# Patient Record
Sex: Female | Born: 1980
Health system: Southern US, Community
[De-identification: ages and names within clinical notes are randomized; demographics above are authoritative.]

## PROBLEM LIST (undated history)

## (undated) DIAGNOSIS — F329 Major depressive disorder, single episode, unspecified: Secondary | ICD-10-CM

## (undated) DIAGNOSIS — L732 Hidradenitis suppurativa: Secondary | ICD-10-CM

## (undated) DIAGNOSIS — K279 Peptic ulcer, site unspecified, unspecified as acute or chronic, without hemorrhage or perforation: Secondary | ICD-10-CM

## (undated) DIAGNOSIS — F419 Anxiety disorder, unspecified: Secondary | ICD-10-CM

## (undated) DIAGNOSIS — F32A Depression, unspecified: Secondary | ICD-10-CM

## (undated) DIAGNOSIS — A630 Anogenital (venereal) warts: Secondary | ICD-10-CM

## (undated) DIAGNOSIS — L0591 Pilonidal cyst without abscess: Secondary | ICD-10-CM

## (undated) HISTORY — DX: Peptic ulcer, site unspecified, unspecified as acute or chronic, without hemorrhage or perforation: K27.9

## (undated) HISTORY — DX: Major depressive disorder, single episode, unspecified: F32.9

## (undated) HISTORY — DX: Pilonidal cyst without abscess: L05.91

## (undated) HISTORY — DX: Depression, unspecified: F32.A

## (undated) HISTORY — DX: Anogenital (venereal) warts: A63.0

## (undated) HISTORY — DX: Hidradenitis suppurativa: L73.2

---
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
## 2002-01-25 ENCOUNTER — Emergency Department (HOSPITAL_COMMUNITY): Admission: EM | Admit: 2002-01-25 | Discharge: 2002-01-25 | Payer: Self-pay | Admitting: Emergency Medicine

## 2002-02-25 ENCOUNTER — Emergency Department (HOSPITAL_COMMUNITY): Admission: EM | Admit: 2002-02-25 | Discharge: 2002-02-25 | Payer: Self-pay | Admitting: *Deleted

## 2002-03-29 ENCOUNTER — Emergency Department (HOSPITAL_COMMUNITY): Admission: EM | Admit: 2002-03-29 | Discharge: 2002-03-29 | Payer: Self-pay | Admitting: *Deleted

## 2002-04-08 ENCOUNTER — Emergency Department (HOSPITAL_COMMUNITY): Admission: EM | Admit: 2002-04-08 | Discharge: 2002-04-08 | Payer: Self-pay | Admitting: Emergency Medicine

## 2002-05-08 ENCOUNTER — Inpatient Hospital Stay (HOSPITAL_COMMUNITY): Admission: AD | Admit: 2002-05-08 | Discharge: 2002-05-08 | Payer: Self-pay | Admitting: Family Medicine

## 2002-05-08 ENCOUNTER — Encounter: Payer: Self-pay | Admitting: Family Medicine

## 2002-06-01 ENCOUNTER — Inpatient Hospital Stay (HOSPITAL_COMMUNITY): Admission: AD | Admit: 2002-06-01 | Discharge: 2002-06-01 | Payer: Self-pay | Admitting: Obstetrics and Gynecology

## 2002-06-27 HISTORY — PX: TUBAL LIGATION: SHX77

## 2002-08-02 ENCOUNTER — Ambulatory Visit (HOSPITAL_COMMUNITY): Admission: RE | Admit: 2002-08-02 | Discharge: 2002-08-02 | Payer: Self-pay

## 2002-08-10 ENCOUNTER — Encounter: Payer: Self-pay | Admitting: *Deleted

## 2002-08-11 ENCOUNTER — Inpatient Hospital Stay (HOSPITAL_COMMUNITY): Admission: AD | Admit: 2002-08-11 | Discharge: 2002-08-13 | Payer: Self-pay | Admitting: *Deleted

## 2002-08-13 ENCOUNTER — Encounter (INDEPENDENT_AMBULATORY_CARE_PROVIDER_SITE_OTHER): Payer: Self-pay

## 2002-10-15 ENCOUNTER — Inpatient Hospital Stay (HOSPITAL_COMMUNITY): Admission: AD | Admit: 2002-10-15 | Discharge: 2002-10-15 | Payer: Self-pay | Admitting: Obstetrics and Gynecology

## 2002-12-26 ENCOUNTER — Inpatient Hospital Stay (HOSPITAL_COMMUNITY): Admission: AD | Admit: 2002-12-26 | Discharge: 2002-12-26 | Payer: Self-pay | Admitting: Obstetrics & Gynecology

## 2003-02-02 ENCOUNTER — Inpatient Hospital Stay (HOSPITAL_COMMUNITY): Admission: AD | Admit: 2003-02-02 | Discharge: 2003-02-02 | Payer: Self-pay | Admitting: Family Medicine

## 2003-02-19 ENCOUNTER — Encounter: Payer: Self-pay | Admitting: Obstetrics & Gynecology

## 2003-02-20 ENCOUNTER — Inpatient Hospital Stay (HOSPITAL_COMMUNITY): Admission: AD | Admit: 2003-02-20 | Discharge: 2003-02-21 | Payer: Self-pay | Admitting: Obstetrics & Gynecology

## 2003-02-20 ENCOUNTER — Encounter: Payer: Self-pay | Admitting: Obstetrics & Gynecology

## 2003-02-20 ENCOUNTER — Encounter (INDEPENDENT_AMBULATORY_CARE_PROVIDER_SITE_OTHER): Payer: Self-pay

## 2003-02-22 ENCOUNTER — Inpatient Hospital Stay (HOSPITAL_COMMUNITY): Admission: AD | Admit: 2003-02-22 | Discharge: 2003-02-26 | Payer: Self-pay | Admitting: Obstetrics and Gynecology

## 2003-02-22 ENCOUNTER — Encounter: Payer: Self-pay | Admitting: Obstetrics and Gynecology

## 2003-02-24 ENCOUNTER — Encounter: Payer: Self-pay | Admitting: Obstetrics and Gynecology

## 2003-03-07 ENCOUNTER — Inpatient Hospital Stay (HOSPITAL_COMMUNITY): Admission: AD | Admit: 2003-03-07 | Discharge: 2003-03-07 | Payer: Self-pay | Admitting: Obstetrics and Gynecology

## 2003-03-23 ENCOUNTER — Emergency Department (HOSPITAL_COMMUNITY): Admission: EM | Admit: 2003-03-23 | Discharge: 2003-03-23 | Payer: Self-pay | Admitting: Emergency Medicine

## 2003-04-20 ENCOUNTER — Inpatient Hospital Stay (HOSPITAL_COMMUNITY): Admission: AD | Admit: 2003-04-20 | Discharge: 2003-04-20 | Payer: Self-pay | Admitting: Obstetrics and Gynecology

## 2003-07-26 ENCOUNTER — Inpatient Hospital Stay (HOSPITAL_COMMUNITY): Admission: AD | Admit: 2003-07-26 | Discharge: 2003-07-27 | Payer: Self-pay | Admitting: *Deleted

## 2004-02-28 ENCOUNTER — Ambulatory Visit (HOSPITAL_COMMUNITY): Admission: AD | Admit: 2004-02-28 | Discharge: 2004-02-28 | Payer: Self-pay | Admitting: *Deleted

## 2004-02-28 ENCOUNTER — Encounter (INDEPENDENT_AMBULATORY_CARE_PROVIDER_SITE_OTHER): Payer: Self-pay | Admitting: Specialist

## 2004-06-16 ENCOUNTER — Inpatient Hospital Stay (HOSPITAL_COMMUNITY): Admission: AD | Admit: 2004-06-16 | Discharge: 2004-06-17 | Payer: Self-pay | Admitting: *Deleted

## 2004-10-19 ENCOUNTER — Other Ambulatory Visit: Admission: RE | Admit: 2004-10-19 | Discharge: 2004-10-19 | Payer: Self-pay | Admitting: Obstetrics and Gynecology

## 2005-03-31 ENCOUNTER — Emergency Department (HOSPITAL_COMMUNITY): Admission: EM | Admit: 2005-03-31 | Discharge: 2005-03-31 | Payer: Self-pay | Admitting: Family Medicine

## 2005-04-19 ENCOUNTER — Emergency Department (HOSPITAL_COMMUNITY): Admission: EM | Admit: 2005-04-19 | Discharge: 2005-04-19 | Payer: Self-pay | Admitting: Family Medicine

## 2005-06-15 ENCOUNTER — Emergency Department (HOSPITAL_COMMUNITY): Admission: EM | Admit: 2005-06-15 | Discharge: 2005-06-15 | Payer: Self-pay | Admitting: Emergency Medicine

## 2005-12-30 ENCOUNTER — Emergency Department (HOSPITAL_COMMUNITY): Admission: EM | Admit: 2005-12-30 | Discharge: 2005-12-30 | Payer: Self-pay | Admitting: Family Medicine

## 2006-01-01 ENCOUNTER — Emergency Department (HOSPITAL_COMMUNITY): Admission: EM | Admit: 2006-01-01 | Discharge: 2006-01-01 | Payer: Self-pay | Admitting: Emergency Medicine

## 2006-02-24 ENCOUNTER — Emergency Department (HOSPITAL_COMMUNITY): Admission: EM | Admit: 2006-02-24 | Discharge: 2006-02-24 | Payer: Self-pay | Admitting: Family Medicine

## 2006-02-24 ENCOUNTER — Emergency Department (HOSPITAL_COMMUNITY): Admission: EM | Admit: 2006-02-24 | Discharge: 2006-02-24 | Payer: Self-pay | Admitting: *Deleted

## 2006-07-05 ENCOUNTER — Other Ambulatory Visit: Admission: RE | Admit: 2006-07-05 | Discharge: 2006-07-05 | Payer: Self-pay | Admitting: *Deleted

## 2006-10-07 ENCOUNTER — Emergency Department (HOSPITAL_COMMUNITY): Admission: EM | Admit: 2006-10-07 | Discharge: 2006-10-07 | Payer: Self-pay | Admitting: Emergency Medicine

## 2007-04-19 ENCOUNTER — Emergency Department (HOSPITAL_COMMUNITY): Admission: EM | Admit: 2007-04-19 | Discharge: 2007-04-19 | Payer: Self-pay | Admitting: Emergency Medicine

## 2007-06-28 LAB — CONVERTED CEMR LAB: Pap Smear: ABNORMAL

## 2010-02-22 ENCOUNTER — Ambulatory Visit: Payer: Self-pay | Admitting: Internal Medicine

## 2010-02-22 DIAGNOSIS — K279 Peptic ulcer, site unspecified, unspecified as acute or chronic, without hemorrhage or perforation: Secondary | ICD-10-CM | POA: Insufficient documentation

## 2010-02-22 DIAGNOSIS — E669 Obesity, unspecified: Secondary | ICD-10-CM | POA: Insufficient documentation

## 2010-02-22 DIAGNOSIS — F329 Major depressive disorder, single episode, unspecified: Secondary | ICD-10-CM | POA: Insufficient documentation

## 2010-02-22 DIAGNOSIS — L732 Hidradenitis suppurativa: Secondary | ICD-10-CM | POA: Insufficient documentation

## 2010-02-22 DIAGNOSIS — F17200 Nicotine dependence, unspecified, uncomplicated: Secondary | ICD-10-CM | POA: Insufficient documentation

## 2010-02-22 LAB — CONVERTED CEMR LAB
ALT: 15 units/L (ref 0–35)
AST: 19 units/L (ref 0–37)
Albumin: 4.1 g/dL (ref 3.5–5.2)
Alkaline Phosphatase: 56 units/L (ref 39–117)
BUN: 8 mg/dL (ref 6–23)
Basophils Absolute: 0.1 10*3/uL (ref 0.0–0.1)
Basophils Relative: 0.5 % (ref 0.0–3.0)
Bilirubin, Direct: 0.1 mg/dL (ref 0.0–0.3)
CO2: 29 meq/L (ref 19–32)
Calcium: 9.3 mg/dL (ref 8.4–10.5)
Chloride: 104 meq/L (ref 96–112)
Creatinine, Ser: 0.7 mg/dL (ref 0.4–1.2)
Eosinophils Absolute: 0.4 10*3/uL (ref 0.0–0.7)
Eosinophils Relative: 3.4 % (ref 0.0–5.0)
GFR calc non Af Amer: 126.87 mL/min (ref >60)
Glucose, Bld: 96 mg/dL (ref 70–99)
HCT: 38.4 % (ref 36.0–46.0)
Hemoglobin: 12.9 g/dL (ref 12.0–15.0)
Hgb A1c MFr Bld: 5.6 % (ref 4.6–6.5)
Lymphocytes Relative: 32.2 % (ref 12.0–46.0)
Lymphs Abs: 3.9 10*3/uL (ref 0.7–4.0)
MCHC: 33.6 g/dL (ref 30.0–36.0)
MCV: 87.3 fL (ref 78.0–100.0)
Monocytes Absolute: 0.7 10*3/uL (ref 0.1–1.0)
Monocytes Relative: 5.9 % (ref 3.0–12.0)
Neutro Abs: 7.1 10*3/uL (ref 1.4–7.7)
Neutrophils Relative %: 58 % (ref 43.0–77.0)
Platelets: 328 10*3/uL (ref 150.0–400.0)
Potassium: 4.1 meq/L (ref 3.5–5.1)
RBC: 4.41 M/uL (ref 3.87–5.11)
RDW: 14.3 % (ref 11.5–14.6)
Sodium: 142 meq/L (ref 135–145)
TSH: 1.13 microintl units/mL (ref 0.35–5.50)
Total Bilirubin: 0.4 mg/dL (ref 0.3–1.2)
Total Protein: 6.8 g/dL (ref 6.0–8.3)
WBC: 12.2 10*3/uL — ABNORMAL HIGH (ref 4.5–10.5)
hCG, Beta Chain, Quant, S: 0.16 milliintl units/mL

## 2010-02-24 ENCOUNTER — Encounter: Payer: Self-pay | Admitting: Internal Medicine

## 2010-03-30 ENCOUNTER — Telehealth: Payer: Self-pay | Admitting: Internal Medicine

## 2010-04-09 ENCOUNTER — Ambulatory Visit: Payer: Self-pay | Admitting: Internal Medicine

## 2010-04-23 ENCOUNTER — Ambulatory Visit: Payer: Self-pay | Admitting: Internal Medicine

## 2010-07-07 ENCOUNTER — Ambulatory Visit
Admission: RE | Admit: 2010-07-07 | Discharge: 2010-07-07 | Payer: Self-pay | Source: Home / Self Care | Attending: Internal Medicine | Admitting: Internal Medicine

## 2010-07-15 ENCOUNTER — Telehealth: Payer: Self-pay | Admitting: Internal Medicine

## 2010-07-27 NOTE — Assessment & Plan Note (Signed)
Summary: NEW    Vital Signs:  Patient profile:   30 year old female Menstrual status:  regular LMP:     02/15/2010 Height:      68 inches Weight:      242 pounds BMI:     36.93 O2 Sat:      97 % on Room air Temp:     97.6 degrees F oral Pulse rate:   94 / minute Pulse rhythm:   regular Resp:     16 per minute BP sitting:   108 / 72  (left arm) Cuff size:   large  Vitals Entered By: Rock Nephew CMA (February 22, 2010 2:55 PM)  Nutrition Counseling: Patient's BMI is greater than 25 and therefore counseled on weight management options.  O2 Flow:  Room air  Primary Care Provider:  Etta Grandchild MD   History of Present Illness: New to me she has a large painful cyst on the back of her neck for several months.  Preventive Screening-Counseling & Management  Alcohol-Tobacco     Alcohol drinks/day: <1     Alcohol type: beer     >5/day in last 3 mos: no     Alcohol Counseling: not indicated; use of alcohol is not excessive or problematic     Feels need to cut down: no     Feels annoyed by complaints: no     Feels guilty re: drinking: no     Needs 'eye opener' in am: no     Smoking Status: current     Smoking Cessation Counseling: yes     Packs/Day: 1.0     Year Started: 2001     Pack years: 10     Tobacco Counseling: to quit use of tobacco products  Caffeine-Diet-Exercise     Does Patient Exercise: no     Exercise Counseling: to improve exercise regimen  Hep-HIV-STD-Contraception     Hepatitis Risk: no risk noted     HIV Risk: no risk noted     STD Risk: no risk noted     Dental Visit-last 6 months no     Sun Exposure-Excessive: no  Safety-Violence-Falls     Seat Belt Use: yes     Helmet Use: yes     Firearms in the Home: no firearms in the home     Smoke Detectors: yes     Violence in the Home: risk noted      Sexual History:  currently monogamous.        Drug Use:  never and no.        Blood Transfusions:  no.    Medications Prior to Update: 1)   None  Current Medications (verified): 1)  Doxycycline Hyclate 100 Mg Tabs (Doxycycline Hyclate) .... One By Mouth Two Times A Day For 30 Days 2)  Lortab 7.5-500 Mg Tabs (Hydrocodone-Acetaminophen) .... One By Mouth Qid As Needed For Pain 3)  Fluconazole 150 Mg Tabs (Fluconazole) .... Take One As Directed  Allergies (verified): No Known Drug Allergies  Past History:  Family History: Last updated: 02/22/2010 Family History Diabetes 1st degree relative Family History Lung cancer  Social History: Last updated: 02/22/2010 Occupation: CNA, Med Tech Single Current Smoker Alcohol use-yes Drug use-no Regular exercise-no  Risk Factors: Alcohol Use: <1 (02/22/2010) >5 drinks/d w/in last 3 months: no (02/22/2010) Exercise: no (02/22/2010)  Risk Factors: Smoking Status: current (02/22/2010) Packs/Day: 1.0 (02/22/2010)  Past Medical History: Depression Peptic ulcer disease Condyloma accmuniata  Past Surgical History: Tubal  ligation  Family History: Reviewed history and no changes required. Family History Diabetes 1st degree relative Family History Lung cancer  Social History: Reviewed history and no changes required. 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  The patient denies anorexia, fever, weight loss, chest pain, syncope, dyspnea on exertion, peripheral edema, prolonged cough, headaches, hemoptysis, abdominal pain, hematuria, difficulty walking, depression, abnormal bleeding, and  enlarged lymph nodes.    Physical Exam  General:  alert, well-developed, well-nourished, well-hydrated, normal appearance, healthy-appearing, cooperative to examination, good hygiene, and overweight-appearing.   Head:  normocephalic, atraumatic, no abnormalities observed, and no abnormalities palpated.   Ears:  R ear normal and L ear normal.   Mouth:  Oral mucosa and oropharynx without lesions or exudates.  Teeth in good repair. Neck:  supple, full ROM, no masses, no thyromegaly, no JVD, normal carotid upstroke, no carotid bruits, no cervical lymphadenopathy, and no neck tenderness.   Lungs:  normal respiratory effort, no intercostal retractions, no accessory muscle use, normal breath sounds, no dullness, no fremitus, no crackles, and no wheezes.   Heart:  normal rate, regular rhythm, no murmur, no gallop, and no rub.   Abdomen:  soft, non-tender, normal bowel sounds, no distention, no masses, no guarding, no rigidity, no rebound tenderness, no abdominal hernia, no inguinal hernia, no hepatomegaly, and no splenomegaly.   Msk:  normal ROM, no joint tenderness, no joint swelling, no joint warmth, no redness over joints, no joint deformities, no joint instability, and no crepitation.   Pulses:  R and L carotid,radial,femoral,dorsalis pedis and posterior tibial pulses are full and equal bilaterally Extremities:  No clubbing, cyanosis, edema, or deformity noted with normal full range of motion of all joints.   Neurologic:  No cranial nerve deficits noted. Station and gait are normal. Plantar reflexes are down-going bilaterally. DTRs are symmetrical throughout. Sensory, motor and coordinative functions appear intact. Skin:  tattoo(s).  she has a very large hidradenitis lesion horizontally across her posterior neck that is erythematous, has a foul smell, and oozes a scant amount of exudate but there is no dsicreet area of induration or fluctuance. In her axillae there are pits, scars and very small cystic  lesions without abscess formation Cervical Nodes:  no anterior cervical adenopathy and no posterior cervical adenopathy.   Axillary Nodes:  no R axillary adenopathy and no L axillary adenopathy.   Inguinal Nodes:  no R inguinal adenopathy and no L inguinal adenopathy.   Psych:  Cognition and judgment appear intact. Alert and cooperative with normal attention span and concentration. No apparent delusions, illusions, hallucinations   Impression & Recommendations:  Problem # 1:  ENCOUNTER FOR LONG-TERM USE OF OTHER MEDICATIONS (ICD-V58.69)  Orders: Venipuncture (00938) TLB-BMP (Basic Metabolic Panel-BMET) (80048-METABOL) TLB-CBC Platelet - w/Differential (85025-CBCD) TLB-Hepatic/Liver Function Pnl (80076-HEPATIC) TLB-TSH (Thyroid Stimulating Hormone) (84443-TSH) TLB-A1C / Hgb A1C (Glycohemoglobin) (83036-A1C) TLB-Preg Serum Quant (B-hCG) (84702-HCG-QN)  Problem # 2:  OBESITY (ICD-278.00)  Orders: Venipuncture (18299) TLB-BMP (Basic Metabolic Panel-BMET) (80048-METABOL) TLB-CBC  Platelet - w/Differential (85025-CBCD) TLB-Hepatic/Liver Function Pnl (80076-HEPATIC) TLB-TSH (Thyroid Stimulating Hormone) (84443-TSH) TLB-A1C / Hgb A1C (Glycohemoglobin) (83036-A1C) TLB-Preg Serum Quant (B-hCG) (84702-HCG-QN)  Problem # 3:  TOBACCO USE (ICD-305.1)  Orders: Venipuncture (84132) TLB-BMP (Basic Metabolic Panel-BMET) (80048-METABOL) TLB-CBC Platelet - w/Differential (85025-CBCD) TLB-Hepatic/Liver Function Pnl (80076-HEPATIC) TLB-TSH (Thyroid Stimulating Hormone) (84443-TSH) TLB-A1C / Hgb A1C (Glycohemoglobin) (83036-A1C) TLB-Preg Serum Quant (B-hCG) (84702-HCG-QN) Tobacco use cessation intermediate 3-10 minutes (44010)  Problem # 4:  HIDRADENITIS SUPPURATIVA (ICD-705.83) start doxycycline, she sees Dermatology later this week Orders: Venipuncture (27253) TLB-BMP (Basic Metabolic Panel-BMET) (80048-METABOL) TLB-CBC Platelet - w/Differential (85025-CBCD) TLB-Hepatic/Liver Function Pnl  (80076-HEPATIC) TLB-TSH (Thyroid Stimulating Hormone) (84443-TSH) TLB-A1C / Hgb A1C (Glycohemoglobin) (83036-A1C) TLB-Preg Serum Quant (B-hCG) (84702-HCG-QN) Tobacco use cessation intermediate 3-10 minutes (66440)  Complete Medication List: 1)  Doxycycline Hyclate 100 Mg Tabs (Doxycycline hyclate) .... One by mouth two times a day for 30 days 2)  Lortab 7.5-500 Mg Tabs (Hydrocodone-acetaminophen) .... One by mouth qid as needed for pain 3)  Fluconazole 150 Mg Tabs (Fluconazole) .... Take one as directed  Patient Instructions: 1)  Please schedule a follow-up appointment in 2 months. 2)  Tobacco is very bad for your health and your loved ones! You Should stop smoking!. 3)  Stop Smoking Tips: Choose a Quit date. Cut down before the Quit date. decide what you will do as a substitute when you feel the urge to smoke(gum,toothpick,exercise). 4)  It is important that you exercise regularly at least 20 minutes 5 times a week. If you develop chest pain, have severe difficulty breathing, or feel very tired , stop exercising immediately and seek medical attention. 5)  You need to lose weight. Consider a lower calorie diet and regular exercise.  6)  If you could be exposed to sexually transmitted diseases, you should use a condom. 7)  If you are having sex and you or your partner don't want a child, use contraception. 8)  Take your antibiotic as prescribed until ALL of it is gone, but stop if you develop a rash or swelling and contact our office as soon as possible. Prescriptions: FLUCONAZOLE 150 MG TABS (FLUCONAZOLE) take one as directed  #1 x 5   Entered and Authorized by:   Etta Grandchild MD   Signed by:   Etta Grandchild MD on 02/22/2010   Method used:   Electronically to        [facility] [address] [id number]* (retail)       [address] [place], [place]  [address]       Ph: [phone]       Fax: [phone]   RxID:   [id number] LORTAB 7.5-500 MG TABS (HYDROCODONE-ACETAMINOPHEN)  One by mouth QID as needed for pain  #35 x 3   Entered and Authorized by:   Etta Grandchild MD   Signed by:   Etta Grandchild MD on 02/22/2010   Method used:   Print then Give to Patient   RxID:   [id number] DOXYCYCLINE HYCLATE 100 MG TABS (DOXYCYCLINE HYCLATE) One by mouth two times a day for 30 days  #60 x 3   Entered and Authorized by:   Etta Grandchild MD   Signed by:   Etta Grandchild MD on 02/22/2010   Method used:   Print then Give to Patient   RxID:   [id number]   Preventive Care Screening  Pap Smear:    Date:  06/28/2007    Results:  abnormal

## 2010-07-27 NOTE — Letter (Signed)
Summary: Results Follow-up Letter  [facility]  [address] [place], [place] [address]   Phone: [phone]  Fax: [phone]    02/24/2010  [address] [place], [place]  [address]  Dear Ms. Tara Ward,   The following are the results of your recent test(s):  Test     Result     CBC       slightly elevated white blood cell count Liver/kidney   normal Thyroid     normal Urine       normal   _________________________________________________________  Please call for an appointment in 2-3 weeks _________________________________________________________ _________________________________________________________ _________________________________________________________  Sincerely,  Sanda Linger MD [facility]

## 2010-07-27 NOTE — Assessment & Plan Note (Signed)
Summary: PER AMI--SCHED--FU--MED--STC   Vital Signs:  Patient profile:   30 year old female Menstrual status:  regular Height:      68 inches Weight:      242 pounds O2 Sat:      98 % on Room air Temp:     98.4 degrees F oral Pulse rate:   80 / minute Pulse rhythm:   regular Resp:     16 per minute BP sitting:   108 / 68  (left arm) Cuff size:   large  Vitals Entered By: Rock Nephew CMA (April 09, 2010 11:19 AM)  O2 Flow:  Room air CC: follow-up visit/med refill Is Patient Diabetic? No Pain Assessment Patient in pain? no       Does patient need assistance? Functional Status Self care Ambulation Normal   Primary Care Provider:  Etta Grandchild MD  CC:  follow-up visit/med refill.  History of Present Illness: She returns for f/up and asks that I change Lortab to Tramadol for pain relief. She has a panful lesion on the back of her neck and says that she saw ?Dermatologist and has a combo or hidradenitis suppurative and keloid, she is trying suppressive therapy with Doxy and pain relief but she says the Derm. told her that she may need to have surgery done.  Preventive Screening-Counseling & Management  Alcohol-Tobacco     Alcohol drinks/day: <1     Alcohol type: beer     >5/day in last 3 mos: no     Alcohol Counseling: not indicated; use of alcohol is not excessive or problematic     Feels need to cut down: no     Feels annoyed by complaints: no     Feels guilty re: drinking: no     Needs 'eye opener' in am: no     Smoking Status: current     Smoking Cessation Counseling: yes     Packs/Day: 1.0     Year Started: 2001     Pack years: 10     Tobacco Counseling: to quit use of tobacco products  Hep-HIV-STD-Contraception     Hepatitis Risk: no risk noted     HIV Risk: no risk noted     STD Risk: no risk noted     Dental Visit-last 6 months no     Sun Exposure-Excessive: no  Clinical Review Panels:  Prevention   Last Pap Smear:  abnormal  (06/28/2007)  Immunizations   Last Tetanus Booster:  Historical (02/22/2010)  Diabetes Management   HgBA1C:  5.6 (02/22/2010)   Creatinine:  0.7 (02/22/2010)  CBC   WBC:  12.2 (02/22/2010)   RBC:  4.41 (02/22/2010)   Hgb:  12.9 (02/22/2010)   Hct:  38.4 (02/22/2010)   Platelets:  328.0 (02/22/2010)   MCV  87.3 (02/22/2010)   MCHC  33.6 (02/22/2010)   RDW  14.3 (02/22/2010)   PMN:  58.0 (02/22/2010)   Lymphs:  32.2 (02/22/2010)   Monos:  5.9 (02/22/2010)   Eosinophils:  3.4 (02/22/2010)   Basophil:  0.5 (02/22/2010)  Complete Metabolic Panel   Glucose:  96 (02/22/2010)   Sodium:  142 (02/22/2010)   Potassium:  4.1 (02/22/2010)   Chloride:  104 (02/22/2010)   CO2:  29 (02/22/2010)   BUN:  8 (02/22/2010)   Creatinine:  0.7 (02/22/2010)   Albumin:  4.1 (02/22/2010)   Total Protein:  6.8 (02/22/2010)   Calcium:  9.3 (02/22/2010)   Total Bili:  0.4 (02/22/2010)   Alk Phos:  56 (02/22/2010)   SGPT (ALT):  15 (02/22/2010)   SGOT (AST):  19 (02/22/2010)   Medications Prior to Update: 1)  Lortab 7.5-500 Mg Tabs (Hydrocodone-Acetaminophen) .... One By Mouth Qid As Needed For Pain 2)  Fluconazole 150 Mg Tabs (Fluconazole) .... Take One As Directed  Current Medications (verified): 1)  Fluconazole 150 Mg Tabs (Fluconazole) .... Take One As Directed 2)  Tramadol Hcl 50 Mg Tabs (Tramadol Hcl) .Marland Kitchen.. 1-2 By Mouth Qid As Needed For Pain  Allergies (verified): No Known Drug Allergies  Past History:  Past Medical History: Last updated: 02/22/2010 Depression Peptic ulcer disease Condyloma accmuniata  Past Surgical History: Last updated: 02/22/2010 Tubal ligation  Family History: Last updated: 02/22/2010 Family History Diabetes 1st degree relative Family History Lung cancer  Social History: Last updated: 02/22/2010 Occupation: CNA, Med Tech Single Current Smoker Alcohol use-yes Drug use-no Regular exercise-no  Risk Factors: Alcohol Use: <1 (04/09/2010) >5  drinks/d w/in last 3 months: no (04/09/2010) Exercise: no (02/22/2010)  Risk Factors: Smoking Status: current (04/09/2010) Packs/Day: 1.0 (04/09/2010)  Family History: Reviewed history from 02/22/2010 and no changes required. Family History Diabetes 1st degree relative Family History Lung cancer  Social History: Reviewed history from 02/22/2010 and no changes required. 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    Physical Exam  General:  alert, well-developed, well-nourished, well-hydrated, normal appearance, healthy-appearing, cooperative to examination, good hygiene, and overweight-appearing.   Head:  normocephalic, atraumatic, no abnormalities observed, and no abnormalities palpated.   Mouth:  Oral mucosa and oropharynx without lesions or exudates.  Teeth in good repair. Neck:  supple, full ROM, no masses, no thyromegaly, no JVD, normal carotid upstroke, no carotid bruits, no cervical lymphadenopathy, and no neck tenderness.   Lungs:  normal respiratory effort, no intercostal retractions, no accessory muscle use, normal breath sounds, no dullness, no fremitus, no crackles, and no wheezes.   Heart:  normal rate, regular rhythm, no murmur, no gallop, and no rub.   Abdomen:  soft, non-tender, normal bowel sounds, no distention, no masses, no guarding, no rigidity, no rebound tenderness, no abdominal hernia, no inguinal hernia, no hepatomegaly, and no splenomegaly.   Msk:  normal ROM, no joint tenderness, no joint swelling, no joint warmth, no redness over joints, no joint deformities, no joint instability, and no crepitation.   Pulses:  R and L carotid,radial,femoral,dorsalis pedis and posterior tibial pulses are full and equal bilaterally Extremities:  No clubbing, cyanosis, edema,  or deformity noted with normal full range of motion of all joints.   Neurologic:  No cranial nerve deficits noted. Station and gait are normal. Plantar reflexes are down-going bilaterally. DTRs are symmetrical throughout. Sensory, motor and coordinative functions appear intact. Skin:  tattoo(s).  she has a very large hidradenitis lesion horizontally across her posterior neck that is erythematous, has a foul smell, and oozes a scant amount of exudate but there is no dsicreet area of induration or fluctuance. In her axillae there are pits, scars and very small cystic lesions without abscess formation Psych:  Cognition and judgment appear intact. Alert and cooperative with normal attention span and concentration. No apparent delusions, illusions, hallucinations   Impression & Recommendations:  Problem # 1:  HIDRADENITIS SUPPURATIVA (ICD-705.83) Assessment Unchanged continue doxy and will try tramadol for pain  Complete Medication List: 1)  Fluconazole 150 Mg Tabs (Fluconazole) .Marland KitchenMarland KitchenMarland Kitchen  Take one as directed 2)  Tramadol Hcl 50 Mg Tabs (Tramadol hcl) .Marland Kitchen.. 1-2 by mouth qid as needed for pain  Patient Instructions: 1)  Please schedule a follow-up appointment as needed. Prescriptions: TRAMADOL HCL 50 MG TABS (TRAMADOL HCL) 1-2 by mouth QID as needed for pain  #100 x 11   Entered and Authorized by:   Etta Grandchild MD   Signed by:   Etta Grandchild MD on 04/09/2010   Method used:   Electronically to        [facility] [address] [id number]* (retail)       [address]       [place], [place]  [address]       Ph: [phone]       Fax: [phone]   RxID:   [id number]    Immunization History:  Tetanus/Td Immunization History:    Tetanus/Td:  historical (02/22/2010)   Appended Document: PER AMI--SCHED--FU--MED--STC RX called to [facility] [address] [facility]

## 2010-07-27 NOTE — Assessment & Plan Note (Signed)
Summary: bump on left eyelid and left facial swelling,jones,lb   Vital Signs:  Patient profile:   30 year old female Menstrual status:  regular Height:      68 inches Weight:      243 pounds BMI:     37.08 O2 Sat:      98 % on Room air Temp:     97.7 degrees F oral Pulse rate:   71 / minute BP sitting:   108 / 78  (left arm) Cuff size:   large  Vitals Entered By: Bill Salinas CMA (April 23, 2010 4:47 PM)  O2 Flow:  Room air CC: bump on left eye/ ab   Primary Care Provider:  Etta Grandchild MD  CC:  bump on left eye/ ab.  History of Present Illness: Patient presents for evaluation of raised lesion just below the medial canthus of the left eye with surrounding erythema and tenderness. She has had no drainage from this lesion. No matting of the eye or drainage. It is noteworthy that she is on long term doxycycline for what may be a form of hydradinitis mostly involving the posterior neck and is followed by drmatology and Dr. Yetta Barre.  Current Medications (verified): 1)  Fluconazole 150 Mg Tabs (Fluconazole) .... Take One As Directed 2)  Tramadol Hcl 50 Mg Tabs (Tramadol Hcl) .Marland Kitchen.. 1-2 By Mouth Qid As Needed For Pain  Allergies (verified): No Known Drug Allergies PMH-FH-SH reviewed-no changes except otherwise noted  Review of Systems       The patient complains of suspicious skin lesions.  The patient denies fever, weight loss, weight gain, vision loss, headaches, enlarged lymph nodes, and angioedema.    Physical Exam  General:  WNWD AA female in no distress Head:  normocephalic and atraumatic.   Eyes:  pupils equal and pupils round.  C&S left eye is normal Neck:  supple.   Lungs:  normal respiratory effort.   Heart:  normal rate and regular rhythm.   Skin:  small raised lesion below the left eye with surrounding erythema and tenderness of the area to palpation. The lesion is no "pointing"   Impression & Recommendations:  Problem # 1:  SKIN LESION (ICD-709.9) small  lesion that looks like a pimple with some surrounding erythema. She is already takng doxcycline and there is not a clear cut indication to add additional antibiotics.  Plan - apply warm compress to the lesion 3 to 4 times a day.           patient instructed to NOT manipulate this pimple.  Complete Medication List: 1)  Fluconazole 150 Mg Tabs (Fluconazole) .... Take one as directed 2)  Tramadol Hcl 50 Mg Tabs (Tramadol hcl) .Marland Kitchen.. 1-2 by mouth qid as needed for pain   Orders Added: 1)  Est. Patient Level II [83151]

## 2010-07-27 NOTE — Progress Notes (Signed)
Summary: refill denial  Phone Note Refill Request Message from:  Fax from Pharmacy on March 30, 2010 1:32 PM  Refills Requested: Medication #1:  LORTAB 7.5-500 MG TABS One by mouth QID as needed for pain   Dosage confirmed as above?Dosage Confirmed   Supply Requested: 1 month   Notes: rx written 02/22/10  Please advise if ok to refill for this pt  [id number]  Initial call taken by: Rock Nephew CMA,  March 30, 2010 1:32 PM  Follow-up for Phone Call        no Follow-up by: Etta Grandchild MD,  March 30, 2010 5:22 PM  Additional Follow-up for Phone Call Additional follow up Details #1::        Rx denied per MD/ Pharmacy notified via fax.Alvy Beal Archie CMA  April 01, 2010 1:43 PM

## 2010-07-29 NOTE — Assessment & Plan Note (Signed)
Summary: discuss quit smoking/cd   Vital Signs:  Patient profile:   30 year old female Menstrual status:  regular LMP:     06/30/2010 Height:      68 inches Weight:      236 pounds BMI:     36.01 O2 Sat:      98 % on Room air Temp:     98.1 degrees F rectal Pulse rate:   95 / minute Pulse rhythm:   regular Resp:     16 per minute BP sitting:   106 / 70  (left arm) Cuff size:   large  Vitals Entered By: Rock Nephew CMA (July 07, 2010 11:28 AM)  Nutrition Counseling: Patient's BMI is greater than 25 and therefore counseled on weight management options.  O2 Flow:  Room air CC: follow-up visit Is Patient Diabetic? No Pain Assessment Patient in pain? no       Does patient need assistance? Functional Status Self care Ambulation Normal LMP (date): 06/30/2010     Enter LMP: 06/30/2010 Last PAP Result abnormal   Primary Care Provider:  Etta Grandchild MD  CC:  follow-up visit.  History of Present Illness: She returns for f/up and she tells me that she wants to try Chantix to quit smoking.  Also, she tells me that her dermatologist has given her doxycyline for hidradenitis and she is doing very well with that. She has lost 8 pounds with diet and exercise and feels like the skin lesions are smaller and less painful.  Preventive Screening-Counseling & Management  Alcohol-Tobacco     Alcohol drinks/day: <1     Alcohol type: beer     >5/day in last 3 mos: no     Alcohol Counseling: not indicated; use of alcohol is not excessive or problematic     Feels need to cut down: no     Feels annoyed by complaints: no     Feels guilty re: drinking: no     Needs 'eye opener' in am: no     Smoking Status: current     Smoking Cessation Counseling: yes     Packs/Day: 1.0     Year Started: 2001     Pack years: 10     Tobacco Counseling: to quit use of tobacco products  Hep-HIV-STD-Contraception     Hepatitis Risk: no risk noted     HIV Risk: no risk noted     STD  Risk: no risk noted     Dental Visit-last 6 months no     Sun Exposure-Excessive: no  Clinical Review Panels:  Prevention   Last Pap Smear:  abnormal (06/28/2007)  Immunizations   Last Tetanus Booster:  Historical (02/22/2010)  Diabetes Management   HgBA1C:  5.6 (02/22/2010)   Creatinine:  0.7 (02/22/2010)  CBC   WBC:  12.2 (02/22/2010)   RBC:  4.41 (02/22/2010)   Hgb:  12.9 (02/22/2010)   Hct:  38.4 (02/22/2010)   Platelets:  328.0 (02/22/2010)   MCV  87.3 (02/22/2010)   MCHC  33.6 (02/22/2010)   RDW  14.3 (02/22/2010)   PMN:  58.0 (02/22/2010)   Lymphs:  32.2 (02/22/2010)   Monos:  5.9 (02/22/2010)   Eosinophils:  3.4 (02/22/2010)   Basophil:  0.5 (02/22/2010)  Complete Metabolic Panel   Glucose:  96 (02/22/2010)   Sodium:  142 (02/22/2010)   Potassium:  4.1 (02/22/2010)   Chloride:  104 (02/22/2010)   CO2:  29 (02/22/2010)   BUN:  8 (02/22/2010)  Creatinine:  0.7 (02/22/2010)   Albumin:  4.1 (02/22/2010)   Total Protein:  6.8 (02/22/2010)   Calcium:  9.3 (02/22/2010)   Total Bili:  0.4 (02/22/2010)   Alk Phos:  56 (02/22/2010)   SGPT (ALT):  15 (02/22/2010)   SGOT (AST):  19 (02/22/2010)   Problems Prior to Update: 1)  Encounter For Long-term Use of Other Medications  (ICD-V58.69) 2)  Obesity  (ICD-278.00) 3)  Tobacco Use  (ICD-305.1) 4)  Hidradenitis Suppurativa  (ICD-705.83) 5)  Peptic Ulcer Disease  (ICD-533.90) 6)  Depression  (ICD-311) 7)  Family History Diabetes 1st Degree Relative  (ICD-V18.0)  Medications Prior to Update: 1)  Fluconazole 150 Mg Tabs (Fluconazole) .... Take One As Directed 2)  Tramadol Hcl 50 Mg Tabs (Tramadol Hcl) .Marland Kitchen.. 1-2 By Mouth Qid As Needed For Pain  Current Medications (verified): 1)  Tramadol Hcl 50 Mg Tabs (Tramadol Hcl) .Marland Kitchen.. 1-2 By Mouth Qid As Needed For Pain 2)  Doxycycline Hyclate 100 Mg Tabs (Doxycycline Hyclate) .... One By Mouth Two Times A Day 3)  Chantix Starting Month Pak 0.5 Mg X 11 & 1 Mg X 42 Tabs  (Varenicline Tartrate) .... Take As Directed 4)  Chantix Continuing Month Pak 1 Mg Tabs (Varenicline Tartrate) .... One By Mouth Two Times A Day  Allergies (verified): No Known Drug Allergies  Past History:  Past Medical History: Last updated: 02/22/2010 Depression Peptic ulcer disease Condyloma accmuniata  Past Surgical History: Last updated: 02/22/2010 Tubal ligation  Family History: Last updated: 02/22/2010 Family History Diabetes 1st degree relative Family History Lung cancer  Social History: Last updated: 02/22/2010 Occupation: CNA, Med Tech Single Current Smoker Alcohol use-yes Drug use-no Regular exercise-no  Risk Factors: Alcohol Use: <1 (07/07/2010) >5 drinks/d w/in last 3 months: no (07/07/2010) Exercise: no (02/22/2010)  Risk Factors: Smoking Status: current (07/07/2010) Packs/Day: 1.0 (07/07/2010)  Family History: Reviewed history from 02/22/2010 and no changes required. Family History Diabetes 1st degree relative Family History Lung cancer  Social History: Reviewed history from 02/22/2010 and no changes required. Occupation: Lawyer, Med [employer] Single Current Smoker Alcohol use-yes Drug use-no Regular exercise-no  Review of Systems  The patient denies anorexia, fever, weight loss, weight gain, chest pain, syncope, dyspnea on exertion, peripheral edema, prolonged cough, headaches, hemoptysis, abdominal pain, suspicious skin lesions, and enlarged lymph nodes.   Psych:  Denies anxiety, depression, easily angered, easily tearful, irritability, mental problems, panic attacks, sense of great danger, suicidal thoughts/plans, and thoughts of violence.  Physical Exam  General:  alert, well-developed, well-nourished, and well-hydrated.   Head:  normocephalic and atraumatic.   Eyes:  vision grossly intact, pupils equal, and pupils round.   Mouth:  Oral mucosa and oropharynx without lesions or exudates.  Teeth in good repair. Neck:  supple, full ROM, no  masses, no thyromegaly, no thyroid nodules or tenderness, and no JVD.   Lungs:  normal respiratory effort, no intercostal retractions, no accessory muscle use, normal breath sounds, no dullness, no fremitus, no crackles, and no wheezes.   Heart:  normal rate, regular rhythm, no murmur, no gallop, no rub, and no JVD.   Abdomen:  soft, non-tender, normal bowel sounds, no distention, no masses, no guarding, no rigidity, no rebound tenderness, no abdominal hernia, no inguinal hernia, no hepatomegaly, and no splenomegaly.   Msk:  normal ROM, no joint tenderness, no joint swelling, no joint warmth, no redness over joints, no joint deformities, no joint instability, and no crepitation.   Pulses:  R and L carotid,radial,femoral,dorsalis pedis  and posterior tibial pulses are full and equal bilaterally Extremities:  No clubbing, cyanosis, edema, or deformity noted with normal full range of motion of all joints.   Neurologic:  No cranial nerve deficits noted. Station and gait are normal. Plantar reflexes are down-going bilaterally. DTRs are symmetrical throughout. Sensory, motor and coordinative functions appear intact. Skin:  turgor normal, color normal, and no rashes.   Cervical Nodes:  no anterior cervical adenopathy and no posterior cervical adenopathy.   Axillary Nodes:  no R axillary adenopathy and no L axillary adenopathy.   Inguinal Nodes:  no R inguinal adenopathy and no L inguinal adenopathy.   Psych:  Cognition and judgment appear intact. Alert and cooperative with normal attention span and concentration. No apparent delusions, illusions, hallucinations   Impression & Recommendations:  Problem # 1:  TOBACCO USE (ICD-305.1) Assessment Improved  Her updated medication list for this problem includes:    Chantix Starting Month Pak 0.5 Mg X 11 & 1 Mg X 42 Tabs (Varenicline tartrate) .Marland Kitchen... Take as directed    Chantix Continuing Month Pak 1 Mg Tabs (Varenicline tartrate) ..... One by mouth two times a  day  Encouraged smoking cessation and discussed different methods for smoking cessation.   Problem # 2:  OBESITY (ICD-278.00) Assessment: Improved  Ht: 68 (07/07/2010)   Wt: 236 (07/07/2010)   BMI: 36.01 (07/07/2010)  Problem # 3:  HIDRADENITIS SUPPURATIVA (ICD-705.83) Assessment: Improved continue doxycycline  Problem # 4:  DEPRESSION (ICD-311) Assessment: Improved  Discussed treatment options, including trial of antidpressant medication. Will refer to behavioral health. Follow-up call in in 24-48 hours and recheck in 2 weeks, sooner as needed. Patient agrees to call if any worsening of symptoms or thoughts of doing harm arise. Verified that the patient has no suicidal ideation at this time.   Complete Medication List: 1)  Tramadol Hcl 50 Mg Tabs (Tramadol hcl) .Marland Kitchen.. 1-2 by mouth qid as needed for pain 2)  Doxycycline Hyclate 100 Mg Tabs (Doxycycline hyclate) .... One by mouth two times a day 3)  Chantix Starting Month Pak 0.5 Mg X 11 & 1 Mg X 42 Tabs (Varenicline tartrate) .... Take as directed 4)  Chantix Continuing Month Pak 1 Mg Tabs (Varenicline tartrate) .... One by mouth two times a day  Patient Instructions: 1)  Please schedule a follow-up appointment in 3 months. 2)  Tobacco is very bad for your health and your loved ones! You Should stop smoking!. 3)  Stop Smoking Tips: Choose a Quit date. Cut down before the Quit date. decide what you will do as a substitute when you feel the urge to smoke(gum,toothpick,exercise). 4)  It is important that you exercise regularly at least 20 minutes 5 times a week. If you develop chest pain, have severe difficulty breathing, or feel very tired , stop exercising immediately and seek medical attention. 5)  You need to lose weight. Consider a lower calorie diet and regular exercise.  6)  You need to have a Pap Smear to prevent cervical cancer. 7)  If you could be exposed to sexually transmitted diseases, you should use a condom. 8)  If you are  having sex and you or your partner don't want a child, use contraception. Prescriptions: CHANTIX CONTINUING MONTH PAK 1 MG TABS (VARENICLINE TARTRATE) One by mouth two times a day  #60 x 3   Entered and Authorized by:   Etta Grandchild MD   Signed by:   Etta Grandchild MD on 07/07/2010   Method  used:   Electronically to        [facility]. #[id number]* (retail)       [address] [place], [place]  [address]       Ph: [phone]       Fax: [phone]   RxID:   [id number] CHANTIX STARTING MONTH PAK 0.5 MG X 11 & 1 MG X 42 TABS (VARENICLINE TARTRATE) take as directed  #53 x 0   Entered and Authorized by:   Etta Grandchild MD   Signed by:   Etta Grandchild MD on 07/07/2010   Method used:   Electronically to        [facility] #[id number]* (retail)       [address] [place], [place]  [address]       Ph: [phone]       Fax: [phone]   RxID:   [id number] DOXYCYCLINE HYCLATE 100 MG TABS (DOXYCYCLINE HYCLATE) One by mouth two times a day  #60 x 5   Entered and Authorized by:   Etta Grandchild MD   Signed by:   Etta Grandchild MD on 07/07/2010   Method used:   Electronically to        [facility] #[id number]* (retail)       [address] [place], [place]  [address]       Ph: [phone]       Fax: [phone]   RxID:   [id number]    Orders Added: 1)  Est. Patient Level IV [94854]

## 2010-07-29 NOTE — Progress Notes (Signed)
Summary: TRAMADOL  Phone Note Call from Patient   Summary of Call: Pt states that tramadol per pharm can not be filled until Tuesday - pt wants short term refill to get till tuesday. Need to call and clarify when last filled w/pharmacy.   Initial call taken by: Lamar Sprinkles, CMA,  July 15, 2010 4:31 PM  Follow-up for Phone Call        Called pharm - pt picked up #100 yesterday evening. LEft vm for pt that there should be no problems Follow-up by: Lamar Sprinkles, CMA,  July 16, 2010 10:24 AM

## 2010-09-10 ENCOUNTER — Telehealth: Payer: Self-pay | Admitting: Internal Medicine

## 2010-09-13 ENCOUNTER — Telehealth: Payer: Self-pay | Admitting: Internal Medicine

## 2010-09-14 NOTE — Progress Notes (Signed)
Summary: refill  Phone Note Refill Request Message from:  Pharmacy on September 10, 2010 10:23 AM  Refills Requested: Medication #1:  TRAMADOL HCL 50 MG TABS 1-2 by mouth QID as needed for pain  Is this ok to refill?  Initial call taken by: Rock Nephew CMA,  September 10, 2010 10:24 AM  Follow-up for Phone Call        she was given a year's worth on 04/09/10, she should not need a refill at this time Follow-up by: Etta Grandchild MD,  September 10, 2010 10:26 AM  Additional Follow-up for Phone Call Additional follow up Details #1::        Pharmacy notified.Alvy Beal Archie CMA  September 10, 2010 10:53 AM

## 2010-09-15 ENCOUNTER — Ambulatory Visit (INDEPENDENT_AMBULATORY_CARE_PROVIDER_SITE_OTHER): Payer: PRIVATE HEALTH INSURANCE | Admitting: Internal Medicine

## 2010-09-15 ENCOUNTER — Encounter: Payer: Self-pay | Admitting: Internal Medicine

## 2010-09-15 VITALS — BP 104/62 | HR 84 | Temp 98.5°F | Wt 236.0 lb

## 2010-09-15 DIAGNOSIS — L732 Hidradenitis suppurativa: Secondary | ICD-10-CM

## 2010-09-15 DIAGNOSIS — Z23 Encounter for immunization: Secondary | ICD-10-CM | POA: Insufficient documentation

## 2010-09-15 MED ORDER — TRAMADOL HCL 50 MG PO TABS: 50.0000 mg | ORAL_TABLET | Freq: Four times a day (QID) | ORAL | Status: DC | PRN

## 2010-09-15 NOTE — Patient Instructions (Signed)
Hidradenitis Suppurativa, Sweat Gland Abscess Hidradenitis suppurativa is a long lasting (chronic), uncommon disease of the sweat glands. With this, boil-like lumps and scarring develop in the groin, some times under the arms (axillae), and under the breasts. It may also uncommonly occur behind the ears, in the crease of the buttocks, and around the genitals.  CAUSES The cause is from a blocking of the sweat glands. They then become infected. It may cause drainage and odor. It is not contagious. So it cannot be given to someone else. It most often shows up in puberty (about 10 to 28 years of age). But it may happen much later. It is similar to acne which is a disease of the sweat glands. This condition is slightly more common in African-Americans and women. SYMPTOMS  Hidradenitis usually starts as one or more red, tender, swellings in the groin or under the arms (axilla).   Over a period of hours to days the lesions get larger. They often open to the skin surface, draining clear to yellow-colored fluid.   The infected area heals with scarring.  DIAGNOSIS Your caregiver makes this diagnosis by looking at you. Sometimes cultures (growing germs on plates in the lab) may be taken. This is to see what germ (bacterium) is causing the infection.  TREATMENT & HOME CARE INSTRUCTIONS  Topical germ killing medicine applied to the skin (antibiotics) are the treatment of choice. Antibiotics taken by mouth (systemic) are sometimes needed when the condition is getting worse or is severe.   Avoid tight-fitting clothing which traps moisture in.   Dirt does not cause hidradenitis and it is not caused by poor hygiene.   Involved areas should be cleaned daily using an antibacterial soap. Some patients find that the liquid form of Lever 2000, applied to the involved areas as a lotion after bathing, can help reduce the odor related to this condition.   Sometimes surgery is needed to drain infected areas or remove  scarred tissue. Removal of large amounts of tissue is used only in severe cases.   Birth control pills may be helpful.   Oral retinoids (vitamin A derivatives) for 6 to 12 months which are effective for acne may also help this condition.   Weight loss will improve but not cure hidradenitis. It is made worse by being overweight. But the condition is not caused by being overweight.   This condition is more common in people who have had acne.   It may become worse under stress.  There is no medical cure for hidradenitis. It can be controlled, but not cured. The condition usually continues for years with periods of getting worse and getting better (remission). Document Released: 01/26/2004 Document Re-Released: 03/22/2008 Exit[employer] Patient Information 2011 [employer]. 

## 2010-09-15 NOTE — Progress Notes (Signed)
  Subjective:    Patient ID: Tara Ward, female    DOB: [birth year], 30 y.o.   MRN: [id number]  HPI She returns for f/up and tells me that she needs a refill on tramadol. She is still having flare ups of hidradenitis and seeing a derm about this every so often. She has no side effects from the tramadol and she tells me that she does not feel dependent on it as some days she only takes one dose but on other days she takes a little more.    Review of Systems  Constitutional: Negative for fever, chills, diaphoresis, activity change, appetite change, fatigue and unexpected weight change.  HENT: Negative for facial swelling and neck pain.   Respiratory: Negative for cough, choking, chest tightness, shortness of breath, wheezing and stridor.   Cardiovascular: Negative for chest pain, palpitations and leg swelling.  Gastrointestinal: Negative for nausea, vomiting, abdominal pain and abdominal distention.  Genitourinary: Negative for dysuria, urgency, hematuria, flank pain, decreased urine volume, difficulty urinating and menstrual problem.  Musculoskeletal: Negative for back pain and arthralgias.  Neurological: Negative for dizziness, tremors, seizures, syncope, weakness, light-headedness, numbness and headaches.  Hematological: Negative for adenopathy.  Psychiatric/Behavioral: Negative for hallucinations, behavioral problems, confusion, dysphoric mood, decreased concentration and agitation.       Objective:   Physical Exam  Constitutional: She is oriented to person, place, and time. She appears well-developed and well-nourished. No distress.  HENT:  Head: Atraumatic.  Mouth/Throat: Oropharynx is clear and moist. No oropharyngeal exudate.  Eyes: Conjunctivae and EOM are normal. Pupils are equal, round, and reactive to light. Left eye exhibits no discharge.  Neck: No thyromegaly present.  Cardiovascular: Normal rate, regular rhythm, normal heart sounds and intact distal pulses.  Exam reveals  no gallop and no friction rub.   No murmur heard. Pulmonary/Chest: No respiratory distress. She has no wheezes. She has no rales. She exhibits no tenderness.  Abdominal: She exhibits no distension. There is no rebound and no guarding.  Musculoskeletal: She exhibits no edema.  Lymphadenopathy:    She has no cervical adenopathy.  Neurological: She is alert and oriented to person, place, and time. She has normal reflexes. She displays normal reflexes. No cranial nerve deficit. She exhibits normal muscle tone. Coordination normal.  Skin: Skin is warm and dry. She is not diaphoretic.  Psychiatric: She has a normal mood and affect. Her behavior is normal. Judgment and thought content normal.          Assessment & Plan:

## 2010-09-15 NOTE — Assessment & Plan Note (Signed)
Continue tramadol prn for pain, f/up with derm as directed

## 2010-09-23 NOTE — Progress Notes (Signed)
Summary: Pt req rfs of Tramadol   Phone Note Call from Patient   Summary of Call: Pt left vm req to know why tramadol was denied. I call pharm - pt was given #100 w/11 refills. She has used all 11 refills. Last few fill dates were - 3/6, 3/23, 2/12, 1/31, 1/19 and 1/9.  Initial call taken by: Lamar Sprinkles, CMA,  September 13, 2010 2:24 PM  Follow-up for Phone Call        she is taking too much, no more refills for now Follow-up by: Etta Grandchild MD,  September 13, 2010 2:27 PM  Additional Follow-up for Phone Call Additional follow up Details #1::        left detailed vm for pt Additional Follow-up by: Lamar Sprinkles, CMA,  September 13, 2010 5:25 PM

## 2010-10-25 ENCOUNTER — Other Ambulatory Visit: Payer: Self-pay | Admitting: Internal Medicine

## 2010-11-03 ENCOUNTER — Other Ambulatory Visit: Payer: Self-pay | Admitting: Gynecology

## 2010-11-10 ENCOUNTER — Ambulatory Visit (INDEPENDENT_AMBULATORY_CARE_PROVIDER_SITE_OTHER): Payer: PRIVATE HEALTH INSURANCE | Admitting: Internal Medicine

## 2010-11-10 ENCOUNTER — Encounter: Payer: Self-pay | Admitting: Internal Medicine

## 2010-11-10 VITALS — BP 110/80 | HR 96 | Temp 98.3°F | Wt 234.0 lb

## 2010-11-10 DIAGNOSIS — L732 Hidradenitis suppurativa: Secondary | ICD-10-CM

## 2010-11-10 MED ORDER — HYDROCODONE-ACETAMINOPHEN 5-325 MG PO TABS: 1.0000 | ORAL_TABLET | ORAL | Status: DC | PRN

## 2010-11-10 NOTE — Progress Notes (Signed)
  Subjective:    Patient ID: Tara Ward, female    DOB: 1981/04/16, 30 y.o.   MRN: [id number]  HPI [name] presents for progressive abscess formation posterior cervical region. She has long-standing hidradenitis suppurativa and is on chronic antibiotic therapy with doxy. She is also followed by dermatology. For the past 5 days there has been an enlarging lesion on the posterior neck with increased pain and now some drainage, despite antibiotics.   Past Medical History  Diagnosis Date  . Depression   . Peptic ulcer disease   . Condyloma acuminata    Past Surgical History  Procedure Date  . Tubal ligation    Family History  Problem Relation Age of Onset  . Cancer Other     lung  . Diabetes Other     1st degree relative   History   Social History  . Marital Status: Single    Spouse Name: N/A    Number of Children: N/A  . Years of Education: N/A   Occupational History  . CNA, Med tech    Social History Main Topics  . Smoking status: Current Everyday Smoker -- 1.0 packs/day    Types: Cigarettes  . Smokeless tobacco: Not on file  . Alcohol Use: No  . Drug Use: Not on file  . Sexually Active: Not on file   Other Topics Concern  . Not on file   Social History Narrative   No regular excerise       Review of Systems Review of Systems  Constitutional:  Negative for fever, chills, activity change and unexpected weight change.  HENT:  Negative for hearing loss, ear pain, congestion, neck stiffness and postnasal drip.   Eyes: Negative for pain, discharge and visual disturbance.  Respiratory: Negative for chest tightness and wheezing.   Cardiovascular: Negative for chest pain and palpitations.       [No decreased exercise tolerance Gastrointestinal: [No change in bowel habit. No bloating or gas. No reflux or indigestion Genitourinary: Negative for urgency, frequency, flank pain and difficulty urinating.  Musculoskeletal: Negative for myalgias, back pain,  arthralgias and gait problem.  Neurological: Negative for dizziness, tremors, weakness and headaches.  Hematological: Negative for adenopathy.  Psychiatric/Behavioral: Negative for behavioral problems and dysphoric mood.      Objective:   Physical Exam Overweight AA woman Derm - multiple chronic lesions on the neck with a large, warm, very tender lesion posterior neck with several draining spots       Assessment & Plan:  1. Hidradentis - patient with acute lesion that is too large for me to manage in the office. She is referred to [facility] for same day eval. She is started on vicodin. She is provided a cpht from [facility] on treatment options.

## 2010-11-12 NOTE — H&P (Signed)
Tara Ward, Tara Ward                          ACCOUNT NO.:  [id number]   MEDICAL RECORD NO.:  [id number]                   PATIENT TYPE:  AMB   LOCATION:  SDC                                  FACILITY:  [facility]   PHYSICIAN:  Janine Limbo, M.D.            DATE OF BIRTH:  [birth year]   DATE OF ADMISSION:  02/28/2004  DATE OF DISCHARGE:                                HISTORY & PHYSICAL   HISTORY OF PRESENT ILLNESS:  The patient is a 30 year old female, gravida 4  para 0-0-3-0, who presents to the maternity admissions area at the [facility] complaining of abdominal pain and vaginal bleeding.  I was on call for the emergency department and therefore I was called to  care for this patient.  The patient's last menstrual period was February 14, 2004.  The patient has had a left ectopic pregnancy in the past.  She was  diagnosed with pelvic inflammatory disease at that time.  A review of the  operative note shows that the fallopian tube was left intact.  The ectopic  pregnancy was expressed from the fimbriated anterior.   OBSTETRICAL HISTORY:  The patient has had one miscarriage prior to 20 weeks.  She has had one elective pregnancy termination.  She has had one left  ectopic pregnancy.   DRUG ALLERGIES:  No known drug allergies.   PAST MEDICAL HISTORY:  The patient denies hypertension and diabetes.   SOCIAL HISTORY:  The patient smokes one pack of cigarettes each day.   REVIEW OF SYSTEMS:  See history of present illness.   FAMILY HISTORY:  Noncontributory.   PHYSICAL EXAMINATION:  VITAL SIGNS:  Blood pressure is 137/54, respirations  20, pulse 101, temperature is 98.3.  HEENT:  Within normal limits except for a sad-appearing female about the  circumstances.  CHEST:  Clear.  HEART:  Regular rate and rhythm.  ABDOMEN:  Soft and tender in the lower quadrants.  PELVIC:  External genitalia shows a small amount of blood.  The vagina shows  a small amount of dark  blood.  The cervix is nontender.  The uterus is  difficult to size because of the patient's obese abdomen.  The uterus is  tender.  Adnexa no masses are appreciated.   LABORATORY VALUES:  Blood type is A positive, pregnancy test is positive,  quantitative beta hCG is 2132, hemoglobin is 12.8, hematocrit is 38.1,  platelet count is 348,000, wet prep is negative.  Ultrasound shows a  probable ruptured ectopic pregnancy.   ASSESSMENT:  1.  Probable ruptured ectopic pregnancy.  2.  History of a prior left ectopic pregnancy.   PLAN:  The patient will undergo a diagnostic laparoscopy.  She understands  that if a pregnancy is found in the tube then we may well need to remove  that portion of the fallopian tube.  She understands the indications for her  procedure and  she accepts the risk of, but not limited to, anesthetic  complications, bleeding, infections, and possible damage to the surrounding  organs.                                               Janine Limbo, M.D.    AVS/MEDQ  D:  02/28/2004  T:  02/28/2004  Job:  [id number]

## 2010-11-12 NOTE — Op Note (Signed)
NAMETINIE, MCGLOIN                          ACCOUNT NO.:  [id number]   MEDICAL RECORD NO.:  [id number]                   PATIENT TYPE:  AMB   LOCATION:  SDC                                  [facility]  [facility]   PHYSICIAN:  Janine Limbo, M.D.            DATE OF BIRTH:  27-Jul-1980   DATE OF PROCEDURE:  02/28/2004  DATE OF DISCHARGE:                                 OPERATIVE REPORT   PREOPERATIVE DIAGNOSES:  1.  Ruptured ectopic pregnancy.  2.  Prior left ectopic pregnancy.   POSTOPERATIVE DIAGNOSES:  1.  Left ectopic pregnancy.  2.  Prior left ectopic pregnancy.  3.  Hemoperitoneum.   PROCEDURE:  1.  Diagnostic laparoscopy.  2.  Laparoscopic left partial salpingectomy.   SURGEON:  Janine Limbo, M.D.   ANESTHESIA:  General.   DISPOSITION:  Tara Ward is a 30 year old female, gravida 4, para 0-0-3-0,  who presents with abdominal pain and vaginal bleeding.  She has a positive  pregnancy test. The patient understands the indications for her procedure  and she accepts the risks of, but not admitted to, anesthetic complications,  bleeding, infections, and possible damage to the surrounding organs.   FINDINGS:  An ultrasound was obtained that showed a probable ruptured  ectopic pregnancy. The patient's quantitative beta HCG was 2132.  Her blood  type is A positive.  On laparoscopy, the patient was found to have a  hemoperitoneum of 150 cc.  The left fallopian tube was badly distorted from  the ectopic pregnancy.  The tube itself was not ruptured, but bleeding was  occurring from the fimbriated anterior. The right ovary was slightly  enlarged, but appeared okay.  The uterus was normal size, shape and  consistency.  The appendix and the upper abdomen appeared normal.   PROCEDURE:  The patient was taken to the operating room, where a general  anesthetic was given. The patient's abdomen, perineum and vagina were  prepped with multiple layers of Betadine.  A Foley catheter  was placed in  the bladder.  Examination under anesthesia was performed. A single toothed  tenaculum was placed on the cervix. The patient was sterilely draped.  The  patient's subumbilical area was injected with 6 cc of 0.5% Marcaine with  epinephrine.  A subumbilical incision was made and the incision was extended  through the subcutaneous tissue, the fascia and the anterior peritoneum.  Dissection was made difficult because of the patient's obese abdomen.  The  Hasson cannula was sutured into place.  The laparoscope was inserted.  The  pelvic structures were visualized.  Two suprapubic areas were injected with  a total of 4 cc of 0.5% Marcaine with epinephrine.  Two incisions were made  and two 5 mm trocars were placed in the pelvis under direct visualization.  The pelvis was vigorously irrigated and the hemoperitoneum was evacuated.  Pictures were taken of the patient's pelvic anatomy.  The decision was made  to proceed with partial salpingectomy because the patient has had one prior  ectopic pregnancy on the left and because of the extensive damage to the  fallopian tube.  The proximal portion of the tube was cauterized and cut.  The mesosalpinx was cauterized and cut. The midportion of the left fallopian  tube was removed (removing the ectopic pregnancy).  There was less than 0.5  cm of fimbriated end left on the left side.  There was approximately 1.5 cm  of proximal fallopian tube left on the left side.  Hemostasis was adequate.  Once again, the pelvis was vigorously irrigated.  The fluid was evacuated.  The bowel was carefully inspected and there was no evidence of trocar damage  or damage from our cauterization.  We felt that we were ready to terminate  the procedure.  All instruments were removed.  The incisions were closed.  The subumbilical incision was closed using figure-of-eight sutures of 2-0  Vicryl in the fascia. The subcutaneous layer was closed with 2-0 Vicryl.  The  skin was reapproximated using 4-0 Vicryl.  The suprapubic incisions were  closed using 4-0 Vicryl.  All sponge, needle and instrument counts were  correct on two occasions.  The estimated blood loss for the procedure was 10  cc.  Remember that there was a 150 cc hemoperitoneum at the beginning of the  procedure.  The patient tolerated her procedure well. She was awakened from  her anesthetic and taken to the recovery room in stable condition.   FOLLOW-UP INSTRUCTIONS:  The patient will return to see Dr. Stefano Gaul in two  to three weeks for a follow-up examination. She was given a copy of the  postoperative instruction sheet as prepared by the [facility] of  [facility] for patients who have undergone laparoscopy.  The patient was  given a prescription for Vicodin and she will taken one or two tablets every  four hours as-needed for pain. She will also take doxycycline 100 mg twice  each day for 10 days.                                               Janine Limbo, M.D.    AVS/MEDQ  D:  02/28/2004  T:  03/01/2004  Job:  [id number]

## 2010-11-12 NOTE — Discharge Summary (Signed)
Tara Ward, Tara Ward                          ACCOUNT NO.:  [id number]   MEDICAL RECORD NO.:  [id number]                   PATIENT TYPE:  INP   LOCATION:  9124                                 [facility]ILITY[facility]  [facility]   PHYSICIAN:  Dineen Kid. Rana Snare, M.D.                 DATE OF BIRTH:  1980-08-03   DATE OF ADMISSION:  02/22/2003  DATE OF DISCHARGE:  02/26/2003                                 DISCHARGE SUMMARY   ADMITTING DIAGNOSES:  1. Abdominal pain status post ectopic pregnancy with diagnostic laparoscopy     on February 20, 2003 with subsequent removal of ectopic pregnancy and     methotrexate administration.  2. Pelvic inflammatory disease.   DISCHARGE DIAGNOSES:  1. Status post ectopic pregnancy.  2. Pelvic inflammatory disease, resolving.   REASON FOR ADMISSION:  Please see written H&P.   HOSPITAL COURSE:  The patient was a 30 year old African-American female that  was admitted to [facility] for complaints of increasing  abdominal pain.  The patient was noted to have an ectopic pregnancy with  diagnostic laparoscopy on August 26.  At the time of the surgery products of  conception were removed from the fimbriated end of the tube.  The patient  was also administered methotrexate.  The patient was noted to have positive  chlamydia culture.  She had been treated with Rocephin IM and Zithromax  orally.  The patient returned to the hospital with complaints of increasing  abdominal pain.  On admission, temperature was noted to be 100.6; otherwise,  other vital signs were stable.  Labs were performed revealing a beta hCG of  648.  Previous quantitative beta hCG on August 27 had been 701.  Hemoglobin  was 11.4 and wbc count was 10,700.  Ultrasound was performed revealing a 3.6  x 1.2 left adnexal mass with a small amount of fluid that was noted in the  cul-de-sac.  The patient was admitted for IV antibiotics and observation.  On hospital day #2 the patient was feeling better.   She was afebrile with  temperature max of 100.3.  Vital signs were stable.  Abdomen was soft and  she was less tender.  Incision was clean, dry, and intact.  Labs revealed  hemoglobin of 11.0, wbc count of 9200, quantitative beta hCG 799.  The  patient was continued on IV antibiotics and close observation.  On hospital  day #3 the patient continued to feel some pain in the lower abdomen;  however, it was better than admission.  Vital signs were stable; she was  afebrile.  Abdomen was soft with some slight tenderness in bilateral lower  quadrant.  No rebound was noted.  Quantitative beta hCG was noted to be down  to 555.  Antibiotics were then changed to ampicillin, gentamycin, and  clindamycin and pelvic ultrasound was scheduled.  On hospital day #4 pain  was improved.  Vital signs  were stable; she was afebrile.  Abdomen was soft  with some continued tenderness in the left lower quadrant with palpation.  Labs revealed hemoglobin of 10.9, wbc count of 8.3, platelet count of 333,  quantitative beta hCG was going down and noted to be 525.  Ultrasound had  revealed a small amount of fluid and a thickened endometrial canal.  The  patient was noted to have two left ovarian cysts measuring 2.9 x 1.8 and 2.6  x 2.2.  She had a small right ovarian cyst noted, 2.2 x 1.5, with a small  residual complex fluid but markedly improved.  The patient was continued on  antibiotics with plan if she continued to be afebrile, for discharge in the  morning.  On hospital day #5 the patient stated pain was dramatically  improved.  Vital signs were stable; she had been afebrile now for 48 hours.  Abdomen was soft.  Umbilical incision was clean, dry, and intact.  Discharge  instructions were reviewed and the patient was discharged home.   CONDITION ON DISCHARGE:  Stable.   DIET:  Regular as tolerated.   ACTIVITY:  No vaginal entry, no heavy lifting.  She is to call for heavy  vaginal bleeding, elevated  temperature greater than 100 degrees, increase in  abdominal pain.   FOLLOW-UP:  The patient is to follow back up in the office in two weeks for  an incision check.  She is also to return to [facility] in  the morning for repeat quantitative beta hCG and follow weekly to 0.   DISCHARGE MEDICATIONS:  1. Tylox #20 one p.o. q.4-6h. p.r.n. pain.  2. Doxycycline 100 mg one p.o. b.i.d. x10 days.  3. Clindamycin 300 mg one p.o. t.i.d. x7.     Julio Sicks, N.P.                        Dineen Kid Rana Snare, M.D.    CC/MEDQ  D:  06/06/2003  T:  06/07/2003  Job:  [id number]

## 2010-11-12 NOTE — Discharge Summary (Signed)
Tara Ward, Tara Ward                            ACCOUNT NO.:  [id number]   MEDICAL RECORD NO.:  [id number]                   PATIENT TYPE:  INP   LOCATION:  9152                                 [facility]ILITY[facility]   PHYSICIAN:  Maurice March, M.D.                  DATE OF BIRTH:  16-Sep-1980   DATE OF ADMISSION:  08/10/2002  DATE OF DISCHARGE:  08/13/2002                                 DISCHARGE SUMMARY   ADMISSION DIAGNOSES:  1. A 30 year old G2, P0-0-1-0 at 19-4/7 weeks with threatened spontaneous     abortion.  2. Bacterial vaginosis.   DISCHARGE DIAGNOSES:  1. A 30 year old G2, P0-0-2-0 on postpartum day zero status post spontaneous     abortion.  2. Bacterial vaginosis.   DISCHARGE MEDICATIONS:  1. Ibuprofen 600 mg p.o. q.6h. p.r.n. pain.  2. Flagyl 500 mg p.o. b.i.d. x10 days.   HISTORY OF PRESENT ILLNESS:  The patient is a 30 year old G2, P0-0-1-0 who  presented at 19-4/7 weeks with bleeding and cramping x1 day.  She was  admitted to labor and delivery and was monitored.  She was placed on Unasyn  3 g IV q.6h. for infection.  An ultrasound was done which showed a cervical  length of 1.9 cm.  The fetal heart tracing throughout her stay was  consistent with a viable vetus.   HOSPITAL COURSE:  Throughout the first two hospitalization days, the patient  had some intermittent cramping but did not change her cervix.  Her cervical  examination remained at 1 cm dilated, 25 to 50% effaced and -3 station.  On  the early morning of her third hospitalization day, she began having  increased cramping and bleeding.  She was noted to have a bulging bag on  cervical examination and she went into spontaneous labor.  She delivered a  previable infant the morning of her discharge.  Apgars were 1 at one minute  and 1 at five minutes.  The fetus had an agonal heart rate for approximately  one hour.   The mother had no complications following her delivery and was discharged on  postpartum day  zero due to the mental anguish from delivering a previable  infant.  She was given grief support and arrangements were made for fetal  burial.   CONDITION ON DISCHARGE:  The patient was discharged to home in stable  condition.   DISCHARGE INSTRUCTIONS:  The patient was told of her medical regimen as  stated above.  She will need to find a primary care physician.  I can handle  any problems that may arise.                                               Maurice March, M.D.  LC/MEDQ  D:  08/13/2002  T:  08/13/2002  Job:  [id number]

## 2010-11-12 NOTE — Op Note (Signed)
[name], Tara                            ACCOUNT NO.:  [id number]   MEDICAL RECORD NO.:  [id number]                   PATIENT TYPE:  OBV   LOCATION:  9115                                 [facility]   PHYSICIAN:  Duke Salvia. Marcelle Overlie, M.D.            DATE OF BIRTH:  06-18-1981   DATE OF PROCEDURE:  02/20/2003  DATE OF DISCHARGE:                                 OPERATIVE REPORT   PREOPERATIVE DIAGNOSIS:  Probable ectopic pregnancy.   POSTOPERATIVE DIAGNOSIS:  Probable ectopic pregnancy.   PROCEDURES:  1. Diagnostic laparoscopy.  2. Retrieval of products of conception from the fimbriated end of the tube     and the cul-de-sac.   SURGEON:  Duke Salvia. Marcelle Overlie, M.D.   ANESTHESIA:  General endotracheal.   COMPLICATIONS:  None.   DRAINS:  Foley catheter.   ESTIMATED BLOOD LOSS:  100 mL.   DESCRIPTION OF PROCEDURE:  The patient went to the operating room.  After an  adequate level of general endotracheal anesthesia was obtained, the  patient's legs were placed in the stirrups and the abdomen, perineum, and  vagina were prepped and draped in the usual manner for laparoscopy.  The  bladder was drained.  EUA carried out.  The uterus was mid position, normal  size, the adnexa without masses.   At the patient's request, no Hulka tenaculum was positioned initially in  case this turned out to be an early IUP.  Attention was directed to the  abdomen.  A 0.5% Marcaine plain was infiltrated locally.  A small incision  was made and the Veress needle was introduced without difficulty.  The  Veress needle was introduced after normal pressure and water testing.  A 2.5  L pneumoperitoneum was then created.  The laparoscopic trocar and sleeve  were then introduced three fingerbreadths above the symphysis in the  midline.  A second 5 mm trocar was positioned.  Immediately a small amount  of old blood was noted.  Suction irrigator was used to irrigate the blood.   The patient was then  placed in Trendelenburg after the old blood clot was  removed.  It was apparent there was some tissue in the cul-de-sac which was  extracted separately.  The tissue could be seen to be coming from the  fimbriated end of the left tube which was somewhat swollen at the ampullary  end but, otherwise, intact.  Tissue from the cul-de-sac and the remaining  tissue from the fimbriated end was gently removed and sent as a specimen.  The right tube and ovary and the remainder of the left tube and ovary were  normal.  No other abnormalities in the pelvis were noted.   Over the course of five minutes, the fimbriated end of the left tube was  observed and noted to be hemostatic.  The decision was made to proceed with  postoperative methotrexate treatment and not perform salpingostomy  at that  point.  The instruments were removed.  The gas allowed to escape.  The ports  were closed with 4-0 Dexon subcuticular sutures and Dermabond.   She tolerated this well, went to the recovery room in good condition.                                               Richard M. Marcelle Overlie, M.D.    RMH/MEDQ  D:  02/20/2003  T:  02/20/2003  Job:  [id number]

## 2010-11-12 NOTE — Discharge Summary (Signed)
   [name], FLIGHT                            ACCOUNT NO.:  [id number]   MEDICAL RECORD NO.:  [id number]                   PATIENT TYPE:  INP   LOCATION:  9115                                 [facility]   PHYSICIAN:  Juluis Mire, M.D.                DATE OF BIRTH:  March 03, 1981   DATE OF ADMISSION:  02/19/2003  DATE OF DISCHARGE:  02/21/2003                                 DISCHARGE SUMMARY   MAIN DIAGNOSIS:  Ectopic pregnancy.   DISCHARGE DIAGNOSIS:  Ectopic pregnancy.   PROCEDURE:  Diagnostic laparoscopy with removal of products of conception.   HISTORY:  For a complete history and physical, please see admission note.   HOSPITAL COURSE:  The patient was brought into the hospital with presumed  ectopic pregnancy.  She was observed overnight.  The following day she  underwent laparoscopic evaluation with finding of products of conception  protruding from the fallopian tube.  These were removed.  Because of the  concern about the possibility of persistent __________, she was given IM  methotrexate.  Her subsequent Chlamydia came back positive.  She was given 1  g p.o. Zithromax and 250 mg IM Rocephin.  It was noted that GC was negative.   She is going to be sent home at this point in time.  At the time of  discharge, she was afebrile with stable vital signs.  Abdomen was soft and  nontender.  Incision was clear.  Her postoperative hemoglobin was 10.7.   COMPLICATIONS:  None during stay in the hospital.   CONDITION ON DISCHARGE:  Stable.   DISPOSITION:  The patient is discharged home with continued ectopic  precautions.   FOLLOW UP:  She is to call with increasing pain or discomfort.  She is also  to call with increasing fever, nausea, vomiting or heavy vaginal bleeding.  She is going to follow up in the office on Monday for repeat quantitative  level.  Discharged home on Tylox as needed for pain.                                               Juluis Mire,  M.D.    JSM/MEDQ  D:  02/21/2003  T:  02/22/2003  Job:  [id number]

## 2010-11-30 ENCOUNTER — Other Ambulatory Visit: Payer: Self-pay | Admitting: Internal Medicine

## 2011-01-04 ENCOUNTER — Other Ambulatory Visit: Payer: Self-pay | Admitting: Internal Medicine

## 2011-01-17 ENCOUNTER — Other Ambulatory Visit: Payer: Self-pay | Admitting: Internal Medicine

## 2011-01-17 NOTE — Telephone Encounter (Signed)
Pt is requesting refill on tramadol. Please Advise

## 2011-01-18 NOTE — Telephone Encounter (Signed)
Dr. Yetta Barre patient. Looks like #100 approved 7/23. Looks ok

## 2011-01-27 ENCOUNTER — Other Ambulatory Visit: Payer: Self-pay | Admitting: Internal Medicine

## 2011-02-09 ENCOUNTER — Ambulatory Visit (INDEPENDENT_AMBULATORY_CARE_PROVIDER_SITE_OTHER): Payer: PRIVATE HEALTH INSURANCE | Admitting: Internal Medicine

## 2011-02-09 DIAGNOSIS — H6980 Other specified disorders of Eustachian tube, unspecified ear: Secondary | ICD-10-CM

## 2011-02-09 DIAGNOSIS — H8110 Benign paroxysmal vertigo, unspecified ear: Secondary | ICD-10-CM

## 2011-02-09 MED ORDER — FLUCONAZOLE 150 MG PO TABS: 150.0000 mg | ORAL_TABLET | Freq: Every day | ORAL | Status: DC

## 2011-02-09 MED ORDER — DOXYCYCLINE HYCLATE 100 MG PO CAPS: 100.0000 mg | ORAL_CAPSULE | Freq: Two times a day (BID) | ORAL | Status: DC

## 2011-02-09 MED ORDER — TRAMADOL HCL 50 MG PO TABS: 50.0000 mg | ORAL_TABLET | Freq: Four times a day (QID) | ORAL | Status: DC | PRN

## 2011-02-10 DIAGNOSIS — H8110 Benign paroxysmal vertigo, unspecified ear: Secondary | ICD-10-CM | POA: Insufficient documentation

## 2011-02-10 DIAGNOSIS — H6980 Other specified disorders of Eustachian tube, unspecified ear: Secondary | ICD-10-CM | POA: Insufficient documentation

## 2011-02-10 NOTE — Assessment & Plan Note (Signed)
Symptoms are c/w eustachian tube dysfunction with subsequent decrease in hearing and increased acuity related to neural stimulation, i.e. Blood flow by the cochlea.  Plan- trial of low dose decongestant          Use of white noise generator at night.

## 2011-02-10 NOTE — Assessment & Plan Note (Signed)
Patient with symptoms that are c/w carotid dysautonomia with no other neurologic findings. Provided full explanation including cartoon.  Plan - care with position change - "rule of 20"           For progressive symptoms, i.e. Syncope, longer duration of visual change will refer for tilt-table testing

## 2011-02-10 NOTE — Progress Notes (Signed)
  Subjective:    Patient ID: Tara Ward, female    DOB: Sep 28, 1980, 28 y.o.   MRN: [id number]  HPI Tara Ward presents for evaluation of momentary change in level of consciousness and vision associated with position change. She reports that her vision will become "splotchy" or she will have loss of vision for up to 10 seconds when she moves for supine to standing along with a sense of poor balance. She has not had full syncope. She will have similar symptoms if she bends over and then rapidly stands back up. These episodes do not occur every time she changes position. She has no other neurologic symptoms: no change in vision, no headache, no paresthesia or motor weakness.  She does complain of a change in hearing so that she is hearing her heart beat and blood rush. This is worse at night. She has not had any loss of hearing. No headache, no drainage from the ear, no otalgia.  I have reviewed the patient's medical history in detail and updated the computerized patient record.    Review of Systems System review is negative for any constitutional, cardiac, pulmonary, GI or neuro symptoms or complaints     Objective:   Physical Exam Vitals noted - low blood pressure Gen'l - WNWD, mildly overweight AA woman in no distress HEENT - [place]/AT, EACs TMs normal. Poor TM movement with insufflation right. Neck supple, no thyromgaly, no carotid bruits Cor - RRR Neuro - A&O x 3, CN II-XII normal, MS normal, cerebellar function normal, DTRs normal      Assessment & Plan:

## 2011-02-15 ENCOUNTER — Telehealth: Payer: Self-pay | Admitting: *Deleted

## 2011-02-15 MED ORDER — TRAMADOL HCL 50 MG PO TABS: 50.0000 mg | ORAL_TABLET | Freq: Four times a day (QID) | ORAL | Status: DC | PRN

## 2011-02-15 NOTE — Telephone Encounter (Signed)
Pt's phone # is pharm, cell # was wrong #, unable to contact patient.

## 2011-02-15 NOTE — Telephone Encounter (Signed)
Pt left vm - Tramadol has always been 1-2 qid prn. It was changed at last OV to 1 q 6 hrs prn. OK to change back to original directions?

## 2011-02-15 NOTE — Telephone Encounter (Signed)
Ok for tramadol 1-2 q 6 = qid,  #[id number] with 3 refills then OV

## 2011-03-31 ENCOUNTER — Telehealth: Payer: Self-pay | Admitting: *Deleted

## 2011-03-31 NOTE — Telephone Encounter (Signed)
Did hear from Dr. Sandria Manly. Plan - trial of 1% clindamycin gel or lotion, patients choice, apply to affected area twice a day. 60g quantity, refill x 5

## 2011-03-31 NOTE — Telephone Encounter (Signed)
Pt left VM - She was dx with "swollen optic nerve" by neurology. Pt was advised to stop doxycycline and told this may be the cause. She takes this for her "sweat gland disorder" and want alternative antibiotic. Please advise.

## 2011-04-01 MED ORDER — CLINDAMYCIN PHOSPHATE 1 % EX LOTN: TOPICAL_LOTION | Freq: Two times a day (BID) | CUTANEOUS | Status: DC

## 2011-04-01 NOTE — Telephone Encounter (Signed)
Patient informed, RX sent in  

## 2011-06-08 ENCOUNTER — Ambulatory Visit (INDEPENDENT_AMBULATORY_CARE_PROVIDER_SITE_OTHER): Payer: PRIVATE HEALTH INSURANCE | Admitting: Internal Medicine

## 2011-06-08 ENCOUNTER — Encounter: Payer: Self-pay | Admitting: Internal Medicine

## 2011-06-08 VITALS — BP 92/68 | HR 85 | Temp 97.9°F | Resp 14 | Wt 231.1 lb

## 2011-06-08 DIAGNOSIS — S0920XA Traumatic rupture of unspecified ear drum, initial encounter: Secondary | ICD-10-CM

## 2011-06-09 ENCOUNTER — Telehealth: Payer: Self-pay | Admitting: Internal Medicine

## 2011-06-09 DIAGNOSIS — S0920XA Traumatic rupture of unspecified ear drum, initial encounter: Secondary | ICD-10-CM | POA: Insufficient documentation
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
## 2011-06-09 NOTE — Telephone Encounter (Signed)
Received copies from [facility],on 12.13.12. Forwarded 3 pages to Dr. Debby Bud ,for review. sj

## 2011-06-23 ENCOUNTER — Other Ambulatory Visit: Payer: Self-pay | Admitting: *Deleted

## 2011-06-23 MED ORDER — TRAMADOL HCL 50 MG PO TABS: 50.0000 mg | ORAL_TABLET | Freq: Four times a day (QID) | ORAL | Status: DC | PRN

## 2011-06-23 NOTE — Telephone Encounter (Signed)
Pt needs refill of Tramadol sent to [facility] on [address] Rx sent, pt informed.

## 2011-06-23 NOTE — Telephone Encounter (Signed)
Pt called and left message regarding Tramadol refill. Left message for pt to callback office.

## 2011-07-28 ENCOUNTER — Telehealth: Payer: Self-pay | Admitting: Internal Medicine

## 2011-07-28 NOTE — Telephone Encounter (Signed)
Received copies from [facility] and Thro[facility],on 1.31.13. Forwarded 1 pages to Dr. Debby Bud ,for review.  sj

## 2011-08-29 ENCOUNTER — Telehealth: Payer: Self-pay | Admitting: *Deleted

## 2011-08-29 NOTE — Telephone Encounter (Signed)
OK to refill tramadol.   Is she have a flare of abscess infection?

## 2011-08-29 NOTE — Telephone Encounter (Signed)
Pt req Rf on Tramadol. Ok to Rf?

## 2011-08-30 MED ORDER — TRAMADOL HCL 50 MG PO TABS: 50.0000 mg | ORAL_TABLET | Freq: Four times a day (QID) | ORAL | Status: DC | PRN

## 2011-09-27 ENCOUNTER — Telehealth: Payer: Self-pay

## 2011-09-27 MED ORDER — TRAMADOL HCL 50 MG PO TABS: 50.0000 mg | ORAL_TABLET | Freq: Four times a day (QID) | ORAL | Status: DC | PRN

## 2011-09-27 NOTE — Telephone Encounter (Signed)
Patient called requesting med refills. Patient notified ok and check

## 2011-11-09 ENCOUNTER — Ambulatory Visit (INDEPENDENT_AMBULATORY_CARE_PROVIDER_SITE_OTHER): Payer: 59 | Admitting: Internal Medicine

## 2011-11-09 ENCOUNTER — Encounter: Payer: Self-pay | Admitting: Internal Medicine

## 2011-11-09 VITALS — BP 100/68 | HR 107 | Temp 98.6°F | Resp 16 | Wt 225.0 lb

## 2011-11-09 DIAGNOSIS — L280 Lichen simplex chronicus: Secondary | ICD-10-CM | POA: Insufficient documentation

## 2011-11-09 MED ORDER — DESOXIMETASONE 0.25 % EX OINT: 1.0000 "application " | TOPICAL_OINTMENT | Freq: Two times a day (BID) | CUTANEOUS | Status: DC

## 2011-11-09 NOTE — Patient Instructions (Signed)
Lichen simplex chronicus - a skin change from chronic scratching. Plan - topicort ointment twice a day; in between applications use a cooling lotion, e.g. alovera with menthol.]   Neurodermatitis Neurodermatitis is a condition caused by persistent, severe itching that causes a person to continually scratch and rub their skin for a long time. This causes the skin to thicken in that area. It is most often seen on the back and sides of the neck, wrists, and ankles. A cycle of itching-scratching usually occurs. The skin can become very irritated from scratching leading to bleeding, scaling, and crusting. Neurodermatitis is usually made worse by:  Drying or irritating chemicals.   Rough clothing.   Scratching.  CAUSES Underlying causes may exist, such as pre-existing eczema. However, neurodermatitis is not usually due to any specific cause.   HOME CARE INSTRUCTIONS  Limit showering to 3 times a week to prevent irritation. Washing your skin with soap and water removes natural oils. This allows small cracks to develop in the skin and the itching may start.   Avoid very hot water and harsh soaps.   Avoid clothing such as wool that irritates your skin.   Avoid scratching if possible.  TREATMENT Treatment includes applying a moisturizer 5 to 6 times daily. Use this on affected areas right after showering to seal in moisture. Avoid moisturizers that are scented. These can dry your skin and make your itching worse. To help control itching, medications may be needed by prescription from a skin doctor (dermatologist). These medications include topical steroids (corticosteroids). Over-the-counter cortisone creams applied 3 to 4 times daily to affected areas can be helpful with the itching. Antibiotic medicine or oral cortisone medicine may be used for severe cases. SEEK MEDICAL CARE IF:   Your condition worsens or is not better after 3 to 4 days of treatment. Document Released: 07/21/2004 Document  Revised: 06/02/2011 Document Reviewed: 08/11/2008 [facility] Patient Information 2012 [place], [place].

## 2011-11-09 NOTE — Assessment & Plan Note (Signed)
Chronic itchy rash - now with thickened skin that continues to itch  Plan - topicort ointment bid           Loratadine 10 mg bid.

## 2011-11-09 NOTE — Progress Notes (Signed)
  Subjective:    Patient ID: Tara Ward, female    DOB: 05-10-1981, 31 y.o.   MRN: [id number]  HPI [name] presents with a chronic pruritic rash on the lateral right ankle. No lesion at this point. She has tried gold bond, antifungals to no avail. She does admit to scratching all the time.  Past Medical History  Diagnosis Date  . Depression   . Peptic ulcer disease   . Condyloma acuminata    Past Surgical History  Procedure Date  . Tubal ligation    Family History  Problem Relation Age of Onset  . Cancer Other     lung  . Diabetes Other     1st degree relative   History   Social History  . Marital Status: Single    Spouse Name: N/A    Number of Children: N/A  . Years of Education: N/A   Occupational History  . CNA, Med tech    Social History Main Topics  . Smoking status: Current Everyday Smoker -- 1.0 packs/day    Types: Cigarettes  . Smokeless tobacco: Not on file  . Alcohol Use: No  . Drug Use: Not on file  . Sexually Active: Not on file   Other Topics Concern  . Not on file   Social History Narrative   No regular excerise       Review of Systems System review is negative for any constitutional, cardiac, pulmonary, GI or neuro symptoms or complaints other than as described in the HPI.     Objective:   Physical Exam Filed Vitals:   11/09/11 1511  BP: 100/68  Pulse: 107  Temp: 98.6 F (37 C)  Resp: 16   Overweight AA woman in no distress Cor- RRR Pulm  Normal respirations Derm - 5 x 7 cm thickened patch of dark skin with no open lesion.       Assessment & Plan:

## 2011-12-09 ENCOUNTER — Encounter: Payer: Self-pay | Admitting: Endocrinology

## 2011-12-09 ENCOUNTER — Ambulatory Visit (INDEPENDENT_AMBULATORY_CARE_PROVIDER_SITE_OTHER): Payer: 59 | Admitting: Endocrinology

## 2011-12-09 VITALS — BP 102/62 | HR 82 | Temp 98.1°F

## 2011-12-09 DIAGNOSIS — R109 Unspecified abdominal pain: Secondary | ICD-10-CM

## 2011-12-09 LAB — POCT URINALYSIS DIPSTICK
Bilirubin, UA: NEGATIVE
Blood, UA: POSITIVE
Glucose, UA: NEGATIVE
Ketones, UA: NEGATIVE
Nitrite, UA: NEGATIVE
Spec Grav, UA: 1.025
Urobilinogen, UA: 0.2
pH, UA: 6

## 2011-12-09 MED ORDER — FLUCONAZOLE 150 MG PO TABS: 150.0000 mg | ORAL_TABLET | Freq: Every day | ORAL | Status: AC

## 2011-12-09 MED ORDER — CIPROFLOXACIN HCL 500 MG PO TABS: 500.0000 mg | ORAL_TABLET | Freq: Two times a day (BID) | ORAL | Status: DC

## 2011-12-09 NOTE — Patient Instructions (Addendum)
i have sent a prescription to your pharmacy, for an antibiotic pill I hope you feel better soon.  If you don't feel better by next week, please call back.   

## 2011-12-09 NOTE — Progress Notes (Signed)
  Subjective:    Patient ID: Tara Ward, female    DOB: 1980/08/17, 31 y.o.   MRN: [id number]  HPI Pt states 5 days of slight pain across the lower abdomen, in the context of urination.  No assoc hematuria. Past Medical History  Diagnosis Date  . Depression   . Peptic ulcer disease   . Condyloma acuminata     Past Surgical History  Procedure Date  . Tubal ligation     History   Social History  . Marital Status: Single    Spouse Name: N/A    Number of Children: N/A  . Years of Education: N/A   Occupational History  . CNA, Med tech    Social History Main Topics  . Smoking status: Current Everyday Smoker -- 1.0 packs/day    Types: Cigarettes  . Smokeless tobacco: Not on file  . Alcohol Use: No  . Drug Use: Not on file  . Sexually Active: Not on file   Other Topics Concern  . Not on file   Social History Narrative   No regular excerise    Current Outpatient Prescriptions on File Prior to Visit  Medication Sig Dispense Refill  . clindamycin (CLINDAMAX) 1 % lotion Apply topically 2 (two) times daily.  60 mL  5  . Desoximetasone (TOPICORT) 0.25 % ointment Apply 1 application topically 2 (two) times daily.  30 g  3  . Multiple Vitamins-Calcium (ONE-A-DAY WOMENS FORMULA PO) Take by mouth daily.        . traMADol (ULTRAM) 50 MG tablet Take 1-2 tablets (50-100 mg total) by mouth 4 (four) times daily as needed for pain.  240 tablet  3    Allergies  Allergen Reactions  . Doxycycline Swelling    SWELLING IN OPTIC NERVE.    Family History  Problem Relation Age of Onset  . Cancer Other     lung  . Diabetes Other     1st degree relative    BP 102/62  Pulse 82  Temp 98.1 F (36.7 C) (Oral)  SpO2 98%  LMP 11/26/2011  Review of Systems Denies fever    Objective:   Physical Exam VITAL SIGNS:  See vs page GENERAL: no distress ABDOMEN: abdomen is soft, nontender.  no hepatosplenomegaly.  not distended.  no hernia  (i reviewed ua result)    Assessment &  Plan:  UTI.  new

## 2011-12-13 ENCOUNTER — Telehealth: Payer: Self-pay | Admitting: Internal Medicine

## 2011-12-13 NOTE — Telephone Encounter (Signed)
  Caller: Tara Ward; PCP: Illene Regulus; CB#: [phone];  Call regarding Abd Pain With Anbx.  Was seen by Dr. Everardo All  12/02/11 and give Cipro 500 mg 1 Pi BID x 7 days but stopped taking 12/11/11 AM due to abd pain.  Now UTI SX are better.  Triaged Abd Pain and UTI SX and last voided at 1200.  Disp = needs to be seen in 24 hrs as not inproving after 72 hrs tx.  No appts available, so sent note to ofc.  for possible work in.  Call back inst given.

## 2011-12-13 NOTE — Telephone Encounter (Signed)
  Caller: Shelisa/Patient; PCP: Illene Regulus; CB#: [phone];  Call regarding Abd Pain With Anbx.  Was seen by Dr. Everardo All  12/02/11 and give Cipro 500 mg 1 Pi BID x 7 days but stopped taking 12/11/11 AM due to abd pain.  Now UTI SX are better.  Triaged Abd Pain and UTI SX and last voided at 1200.  Disp = needs to be seen in 24 hrs as not inproving after 72 hrs tx.  No appts available, so sent note to ofc.  for possible work in.  Pt will be available 12/14/11 afternoon.   Call back inst given.  Not able to access [facility] CAN pool  Needs appt or change in med.

## 2011-12-13 NOTE — Telephone Encounter (Signed)
Incongruities in note: seen 6/7 Dr. Sherrilyn Rist Rx cipro bid x 7 days, report states stopped cipro 6/16 - 3 days after he should have finished cipro. Report says UTI symptoms were resolved - yet then states symptoms not better aft 72 hrs of treatment!!!???  Plan - call patient - if he is having trouble work into the schedule.

## 2011-12-14 NOTE — Telephone Encounter (Signed)
Lvmom for pt to call back as soon as possible

## 2011-12-14 NOTE — Telephone Encounter (Signed)
Called patient. Stated she stopped cipro on Sunday due to extreme abdomenal pain and is still with UTI symptoms. Transferred to scheduler to be worked in

## 2011-12-15 ENCOUNTER — Encounter: Payer: Self-pay | Admitting: Internal Medicine

## 2011-12-15 ENCOUNTER — Ambulatory Visit (INDEPENDENT_AMBULATORY_CARE_PROVIDER_SITE_OTHER): Payer: 59 | Admitting: Internal Medicine

## 2011-12-15 VITALS — BP 100/68 | HR 80 | Temp 98.8°F | Resp 16 | Ht 69.0 in | Wt 226.0 lb

## 2011-12-15 DIAGNOSIS — N390 Urinary tract infection, site not specified: Secondary | ICD-10-CM

## 2011-12-15 MED ORDER — SULFAMETHOXAZOLE-TRIMETHOPRIM 800-160 MG PO TABS: 1.0000 | ORAL_TABLET | Freq: Two times a day (BID) | ORAL | Status: AC

## 2011-12-17 NOTE — Progress Notes (Signed)
  Subjective:    Patient ID: Tara Ward, female    DOB: [birth year], 31 y.o.   MRN: [id number]  HPI [name] was seen by Dr. Sherrilyn Rist on June 14th for UTI. She was started on cipro but was intolerant due to stomach discomfort. CAN message was confusing/misleading so that patient was advised to have OV for failure to respond to therapy when in fact she was never fully treated. She continues to have dysuria, frequency but is not having back pain, rigors or other signs of advanced infection.  Current Outpatient Prescriptions on File Prior to Visit  Medication Sig Dispense Refill  . clindamycin (CLINDAMAX) 1 % lotion Apply topically 2 (two) times daily.  60 mL  5  . Desoximetasone (TOPICORT) 0.25 % ointment Apply 1 application topically 2 (two) times daily.  30 g  3  . Multiple Vitamins-Calcium (ONE-A-DAY WOMENS FORMULA PO) Take by mouth daily.        . traMADol (ULTRAM) 50 MG tablet Take 1-2 tablets (50-100 mg total) by mouth 4 (four) times daily as needed for pain.  240 tablet  3      Review of Systems System review is negative for any constitutional, cardiac, pulmonary, GI or neuro symptoms or complaints other than as described in the HPI.     Objective:   Physical Exam Filed Vitals:   12/15/11 1130  BP: 100/68  Pulse: 80  Temp: 98.8 F (37.1 C)  Resp: 16          Assessment & Plan:  UTI - incomplete treatment with persistent symptoms  Plan Septra DS

## 2011-12-30 ENCOUNTER — Other Ambulatory Visit: Payer: Self-pay | Admitting: *Deleted

## 2011-12-30 MED ORDER — TRAMADOL HCL 50 MG PO TABS: 50.0000 mg | ORAL_TABLET | Freq: Four times a day (QID) | ORAL | Status: DC | PRN

## 2011-12-30 NOTE — Telephone Encounter (Signed)
Patient called request refill on medication Ultram.  Sent to  [facility]

## 2012-03-20 ENCOUNTER — Telehealth: Payer: Self-pay | Admitting: Internal Medicine

## 2012-03-20 NOTE — Telephone Encounter (Signed)
Caller: Tara Ward/Patient; Patient Name: Tara Ward; PCP: Illene Regulus (Adults only); Best Callback Phone Number: [phone]  Last Menstrual Cycle 02/24/12. Patient states she has history of a sweat gland disorder causing recurrent abscesses. Patient states she called requesting a refill for Ultram due to having to take Ultram more frequently for pain related to abscess. Patient states she has a "row of abscesses" on the back of her neck. States they are chronically active "for a few years." States areas on back of her neck are draining yellow, purulent discharge. States she also has 3 areas between her breasts. Afebrile.  Patient states areas have become more painful X 2 weeks. Patient is requesting a prescription for Vicodin. Triage per Skin Lesions Protocol. Care advice given per guidelines related to positive triage assessment for " Any skin lesion with signs and symptoms of worsening infection. Patient advised warm compresses to areas, Ibuprofen as directed for pain. Patient advised to be evaluated in Urgent Care due to office being closed and disposition of "See within 4 hours." Patient declines to be seen in Urgent Care. Patient requesting appointment for 03/21/12. Risk factors of delaying care including developing systemic infection/complications explained to patient. Patient verbalizes understanding. RN strongly recommended evaluation within 4 hours. Patient verbalizes understanding. Patient requests appointment for 03/21/12. Appointment scheduled for 03/21/12 0915 with Dr. Felicity Coyer.

## 2012-03-20 NOTE — Telephone Encounter (Signed)
Caller: [name]/Patient; Patient Name: Tara Ward; PCP: Illene Regulus (Adults only); [facility] Callback Phone Number: [phone]. Patient is requesting a refill of her pain medication, Ultram. After reviewing EPIC, the patient originally was prescribed this medication on 12/30/11. Called and spoke with pharmacy staff member at [facility] on [address], phone number [phone]. They report that this medication was just refilled for this patient on 03/03/12. Verified the fax number for the pharmacist so a refill request can be sent over for Dr. Debby Bud to see. Informed the patient that it is not time for her to have the medication refilled at this time and a request will be sent to Dr. Debby Bud by the pharmacy again today. Patient verbalized understanding.

## 2012-03-21 ENCOUNTER — Encounter: Payer: Self-pay | Admitting: Internal Medicine

## 2012-03-21 ENCOUNTER — Ambulatory Visit (INDEPENDENT_AMBULATORY_CARE_PROVIDER_SITE_OTHER): Payer: 59 | Admitting: Internal Medicine

## 2012-03-21 VITALS — BP 102/64 | HR 89 | Temp 98.1°F | Resp 16 | Wt 226.0 lb

## 2012-03-21 DIAGNOSIS — L732 Hidradenitis suppurativa: Secondary | ICD-10-CM

## 2012-03-21 MED ORDER — SULFAMETHOXAZOLE-TRIMETHOPRIM 800-160 MG PO TABS: 1.0000 | ORAL_TABLET | Freq: Two times a day (BID) | ORAL | Status: DC

## 2012-03-21 MED ORDER — HYDROCODONE-ACETAMINOPHEN 5-325 MG PO TABS: 1.0000 | ORAL_TABLET | Freq: Four times a day (QID) | ORAL | Status: DC | PRN

## 2012-03-21 NOTE — Progress Notes (Signed)
  Subjective:    Patient ID: Tara Ward, female    DOB: 01/24/1981, 31 y.o.   MRN: [id number]  HPI  complains of recurrent abscess - chronic on posterior neck and between breasts Manages same with topical antibiotics and pain control - tramadol ineffective relief at this time Prior surgical eval 2012 - "not a candidate" per insurance due to "cosmetic procedure" classification  Past Medical History  Diagnosis Date  . Depression   . Peptic ulcer disease   . Condyloma acuminata   . Hidradenitis suppurativa     recurrent    Review of Systems  Constitutional: Positive for fatigue. Negative for fever and chills.  HENT: Positive for neck pain. Negative for facial swelling.   Musculoskeletal: Negative for myalgias, back pain and joint swelling.  Skin: Positive for wound (drainage increase from neck HS in past 2 weeks).  Neurological: Negative for dizziness and headaches.       Objective:   Physical Exam BP 102/64  Pulse 89  Temp 98.1 F (36.7 C) (Oral)  Resp 16  Wt 226 lb (102.513 kg)  SpO2 97% Wt Readings from Last 3 Encounters:  03/21/12 226 lb (102.513 kg)  12/15/11 226 lb (102.513 kg)  11/09/11 225 lb (102.059 kg)   Constitutional: She is obese, but appears well-developed and well-nourished. No distress.  Neck: Normal range of motion. Neck supple. No JVD present. No thyromegaly present.  Cardiovascular: Normal rate, regular rhythm and normal heart sounds.  No murmur heard. No BLE edema. Pulmonary/Chest: Effort normal and breath sounds normal. No respiratory distress. She has no wheezes.  Skin:  posterior neck with 5 x 7 cm thickened patch of dark skin and open draining lesions. Psychiatric: She has a normal mood and affect. Her behavior is normal. Judgment and thought content normal.   Lab Results  Component Value Date   WBC 12.2* 02/22/2010   HGB 12.9 02/22/2010   HCT 38.4 02/22/2010   PLT 328.0 02/22/2010   GLUCOSE 96 02/22/2010   ALT 15 02/22/2010   AST 19  02/22/2010   NA 142 02/22/2010   K 4.1 02/22/2010   CL 104 02/22/2010   CREATININE 0.7 02/22/2010   BUN 8 02/22/2010   CO2 29 02/22/2010   TSH 1.13 02/22/2010   HGBA1C 5.6 02/22/2010        Assessment & Plan:  HS, recurrent/chronic - posterior neck - active "flare" with increased drainage Too large to be managed here in PC office -   Use system antibiotics - intol of doxy so rx septra bid x 3 weeks Use norco to augment ongoing pain mgmt and pt to reconsider new surgical eval follow up PCP

## 2012-03-21 NOTE — Patient Instructions (Signed)
It was good to see you today. Use septra antibiotics 2x/day x 3 weeks and norco as needed for pain - Your prescription(s) have been submitted to your pharmacy. Please take as directed and contact our office if you believe you are having problem(s) with the medication(s). Please reconsider need for surgical evaluation to drain and remove your current skin infection Followup with Dr. Alvera Novel as needed, call sooner if problems

## 2012-03-22 ENCOUNTER — Other Ambulatory Visit: Payer: Self-pay | Admitting: *Deleted

## 2012-03-22 NOTE — Telephone Encounter (Signed)
Refill request denied due to soon to refill medication tramadol

## 2012-03-27 ENCOUNTER — Other Ambulatory Visit: Payer: Self-pay | Admitting: Internal Medicine

## 2012-03-27 NOTE — Telephone Encounter (Signed)
Pt is requesting a refill on tramadol.  She says it is due this Thurs.

## 2012-03-28 ENCOUNTER — Telehealth: Payer: Self-pay | Admitting: *Deleted

## 2012-03-28 MED ORDER — TRAMADOL HCL 50 MG PO TABS: 50.0000 mg | ORAL_TABLET | Freq: Four times a day (QID) | ORAL | Status: DC | PRN

## 2012-03-28 NOTE — Telephone Encounter (Signed)
Current Rx sent to [facility] today for refill on tramadol

## 2012-03-28 NOTE — Telephone Encounter (Signed)
PATIENT UPSET AND CRYING DUE TO PAIN . SAW DR, L[place]CH[place] LAST WEEK AND WAS Rx FOR HYDROCODONE BUT CAN NOT TAKE IT AND WORK. PHARMACY TOLD HER YESTERDAY THAT TRAMADOL Rx COULD NOT BE FILLED TILL Thursday. TODAY SHE CALLED AND PHARMACIST SPOKE TO HER RUDELY AND SAID  TRAMADOL COULD NOT BE FILLED TILL 04/02/2012. PATIENT CRYING AT THIS POINT AND TRYING TO WORK. REQUESTING PAIN MEDICATION. FOR SWEAT GLAND INFECTION.. Royann Shivers # [phone]

## 2012-07-17 ENCOUNTER — Other Ambulatory Visit: Payer: Self-pay | Admitting: *Deleted

## 2012-07-18 MED ORDER — TRAMADOL HCL 50 MG PO TABS: 50.0000 mg | ORAL_TABLET | Freq: Four times a day (QID) | ORAL | Status: DC | PRN

## 2012-09-27 ENCOUNTER — Ambulatory Visit (INDEPENDENT_AMBULATORY_CARE_PROVIDER_SITE_OTHER): Payer: Self-pay | Admitting: Internal Medicine

## 2012-09-27 ENCOUNTER — Encounter: Payer: Self-pay | Admitting: Internal Medicine

## 2012-09-27 VITALS — BP 104/72 | HR 78 | Temp 98.2°F | Resp 16 | Ht 68.0 in | Wt 229.0 lb

## 2012-09-27 DIAGNOSIS — N3091 Cystitis, unspecified with hematuria: Secondary | ICD-10-CM

## 2012-09-27 DIAGNOSIS — L732 Hidradenitis suppurativa: Secondary | ICD-10-CM

## 2012-09-27 DIAGNOSIS — N3090 Cystitis, unspecified without hematuria: Secondary | ICD-10-CM

## 2012-09-27 MED ORDER — FLUCONAZOLE 150 MG PO TABS: 150.0000 mg | ORAL_TABLET | Freq: Once | ORAL | Status: DC

## 2012-09-27 MED ORDER — HYDROCODONE-ACETAMINOPHEN 5-325 MG PO TABS: 1.0000 | ORAL_TABLET | Freq: Four times a day (QID) | ORAL | Status: DC | PRN

## 2012-09-27 MED ORDER — SULFAMETHOXAZOLE-TRIMETHOPRIM 800-160 MG PO TABS: 1.0000 | ORAL_TABLET | Freq: Two times a day (BID) | ORAL | Status: DC

## 2012-09-27 NOTE — Patient Instructions (Addendum)
Symptoms and exam are c/w hemorrhagic cystitis - a bladder infection  Plan Septra DS twice a day for five days  Over the counter azo-pyridine for pain taken three times a day (will make urine orange or reddish)  Diflucan after you finish the antibiotic  Lots of fluids including cranberry juice.   Urinary Tract Infection Urinary tract infections (UTIs) can develop anywhere along your urinary tract. Your urinary tract is your body's drainage system for removing wastes and extra water. Your urinary tract includes two kidneys, two ureters, a bladder, and a urethra. Your kidneys are a pair of bean-shaped organs. Each kidney is about the size of your fist. They are located below your ribs, one on each side of your spine. CAUSES Infections are caused by microbes, which are microscopic organisms, including fungi, viruses, and bacteria. These organisms are so small that they can only be seen through a microscope. Bacteria are the microbes that most commonly cause UTIs. SYMPTOMS  Symptoms of UTIs may vary by age and gender of the patient and by the location of the infection. Symptoms in young women typically include a frequent and intense urge to urinate and a painful, burning feeling in the bladder or urethra during urination. Older women and men are more likely to be tired, shaky, and weak and have muscle aches and abdominal pain. A fever may mean the infection is in your kidneys. Other symptoms of a kidney infection include pain in your back or sides below the ribs, nausea, and vomiting. DIAGNOSIS To diagnose a UTI, your caregiver will ask you about your symptoms. Your caregiver also will ask to provide a urine sample. The urine sample will be tested for bacteria and white blood cells. White blood cells are made by your body to help fight infection. TREATMENT  Typically, UTIs can be treated with medication. Because most UTIs are caused by a bacterial infection, they usually can be treated with the use of  antibiotics. The choice of antibiotic and length of treatment depend on your symptoms and the type of bacteria causing your infection. HOME CARE INSTRUCTIONS  If you were prescribed antibiotics, take them exactly as your caregiver instructs you. Finish the medication even if you feel better after you have only taken some of the medication.  Drink enough water and fluids to keep your urine clear or pale yellow.  Avoid caffeine, tea, and carbonated beverages. They tend to irritate your bladder.  Empty your bladder often. Avoid holding urine for long periods of time.  Empty your bladder before and after sexual intercourse.  After a bowel movement, women should cleanse from front to back. Use each tissue only once. SEEK MEDICAL CARE IF:   You have back pain.  You develop a fever.  Your symptoms do not begin to resolve within 3 days. SEEK IMMEDIATE MEDICAL CARE IF:   You have severe back pain or lower abdominal pain.  You develop chills.  You have nausea or vomiting.  You have continued burning or discomfort with urination. MAKE SURE YOU:   Understand these instructions.  Will watch your condition.  Will get help right away if you are not doing well or get worse. Document Released: 03/23/2005 Document Revised: 12/13/2011 Document Reviewed: 07/22/2011 [facility] Patient Information 2013 [place], [place].

## 2012-09-27 NOTE — Progress Notes (Signed)
  Subjective:    Patient ID: Tara Ward, female    DOB: [birth year], 32 y.o.   MRN: [id number]  HPI Tara Ward has had dental work and has been on amoxicillin and had a little nausea. Today she had hematuria and post micturition lower abdominal spasm. She did have some abdominal pain as well. No flank pain. She did stop the amoxicillin. No fever or chills.   Past Medical History  Diagnosis Date  . Depression   . Peptic ulcer disease   . Condyloma acuminata   . Hidradenitis suppurativa     recurrent   Past Surgical History  Procedure Laterality Date  . Tubal ligation     Family History  Problem Relation Age of Onset  . Cancer Other     lung  . Diabetes Other     1st degree relative   History   Social History  . Marital Status: Single    Spouse Name: N/A    Number of Children: N/A  . Years of Education: N/A   Occupational History  . CNA, Med tech    Social History Main Topics  . Smoking status: Current Every Day Smoker -- 1.00 packs/day    Types: Cigarettes  . Smokeless tobacco: Not on file  . Alcohol Use: Yes     Comment: socially   . Drug Use: No  . Sexually Active: Not on file   Other Topics Concern  . Not on file   Social History Narrative   No regular excerise    Current Outpatient Prescriptions on File Prior to Visit  Medication Sig Dispense Refill  . Desoximetasone (TOPICORT) 0.25 % ointment Apply 1 application topically 2 (two) times daily.  30 g  3  . HYDROcodone-acetaminophen (NORCO/VICODIN) 5-325 MG per tablet Take 1 tablet by mouth every 6 (six) hours as needed for pain.  40 tablet  1  . Multiple Vitamins-Calcium (ONE-A-DAY WOMENS FORMULA PO) Take by mouth daily.        Marland Kitchen sulfamethoxazole-trimethoprim (SEPTRA DS) 800-160 MG per tablet Take 1 tablet by mouth 2 (two) times daily.  42 tablet  0  . traMADol (ULTRAM) 50 MG tablet Take 1-2 tablets (50-100 mg total) by mouth 4 (four) times daily as needed for pain.  240 tablet  3  . clindamycin  (CLINDAMAX) 1 % lotion Apply topically 2 (two) times daily.  60 mL  5   No current facility-administered medications on file prior to visit.      Review of Systems System review is negative for any constitutional, cardiac, pulmonary, GI or neuro symptoms or complaints other than as described in the HPI.     Objective:   Physical Exam Filed Vitals:   09/27/12 1727  BP: 104/72  Pulse: 78  Temp: 98.2 F (36.8 C)  Resp: 16   Gen'l- overweight AA female Cor- RRR Pulm - CTAP Abd- BS+ x 4, tender in the suprapubic region. No flank tenderness to percussion Derm - very large soft tissue mass posterior neck with multiple draining tracts with purulent material mixed with blood. Area is fluctuant and tender.       Assessment & Plan:  1. Hemorrhagic cystitis Plan Septra DS bid x 5  otc Azopyridine for comfort.

## 2012-09-30 NOTE — Assessment & Plan Note (Signed)
Large chronically infected mass posterior neck. Previously, >1 year ago, dermatologist diagnosed atypically located hidradenitis suppurativa. Pt was on suppressive doxycycline until she develop optic nerve changes and came off antibiotics. She has continued to have a large mass with constant slow drainage of purulent material. She had been using topical clindamycin until recently.  Plan Refer for dermatologic second opinion to Dr. Jorja Loa.

## 2012-10-03 ENCOUNTER — Encounter: Payer: Self-pay | Admitting: Internal Medicine

## 2012-11-22 ENCOUNTER — Other Ambulatory Visit: Payer: Self-pay

## 2012-11-22 MED ORDER — TRAMADOL HCL 50 MG PO TABS: 50.0000 mg | ORAL_TABLET | Freq: Four times a day (QID) | ORAL | Status: DC | PRN

## 2012-11-22 NOTE — Telephone Encounter (Signed)
Just received the fax from [facility]

## 2012-11-22 NOTE — Telephone Encounter (Signed)
Phone call from patient requesting a prescription refill. She did not say what medication. I called her back and had to leave a message asking her to state what medication she needs refilled .

## 2013-01-07 ENCOUNTER — Other Ambulatory Visit: Payer: Self-pay | Admitting: Internal Medicine

## 2013-02-07 ENCOUNTER — Telehealth: Payer: Self-pay | Admitting: Internal Medicine

## 2013-02-07 NOTE — Telephone Encounter (Signed)
Pt called request new rx for Norco 5/325 mg. Pt stated that she used to take this med for the pain along with Ultram. Pt stated that she need this med due to sweat gland disorder flare up. Please advise.

## 2013-02-08 ENCOUNTER — Telehealth: Payer: Self-pay

## 2013-02-08 MED ORDER — HYDROCODONE-ACETAMINOPHEN 5-325 MG PO TABS: 1.0000 | ORAL_TABLET | Freq: Four times a day (QID) | ORAL | Status: DC | PRN

## 2013-02-08 NOTE — Telephone Encounter (Signed)
Ok for norco 5/325 1 po q6 prn  #90, no refill

## 2013-02-08 NOTE — Telephone Encounter (Signed)
norco script called to [facility] pharmacy

## 2013-02-08 NOTE — Telephone Encounter (Signed)
Phone call to [facility] pharmacy [phone] and verified with them they do have patient's script for norco that was called in earlier this morning.

## 2013-02-22 ENCOUNTER — Other Ambulatory Visit: Payer: Self-pay | Admitting: Internal Medicine

## 2013-02-22 NOTE — Telephone Encounter (Signed)
Tramadol called to pharmacy. 

## 2013-03-22 ENCOUNTER — Other Ambulatory Visit: Payer: Self-pay | Admitting: Internal Medicine

## 2013-03-22 ENCOUNTER — Other Ambulatory Visit: Payer: Self-pay | Admitting: *Deleted

## 2013-03-22 MED ORDER — DESOXIMETASONE 0.25 % EX OINT: TOPICAL_OINTMENT | CUTANEOUS | Status: DC

## 2013-04-19 ENCOUNTER — Other Ambulatory Visit: Payer: Self-pay | Admitting: Internal Medicine

## 2013-04-19 ENCOUNTER — Other Ambulatory Visit: Payer: Self-pay | Admitting: *Deleted

## 2013-04-19 MED ORDER — TRAMADOL HCL 50 MG PO TABS: ORAL_TABLET | ORAL | Status: DC

## 2013-04-19 NOTE — Telephone Encounter (Signed)
Done hardcopy to robin  

## 2013-04-19 NOTE — Telephone Encounter (Signed)
Please advise in MD's absence. Pt is calling stating she will be out this weekend.

## 2013-04-19 NOTE — Telephone Encounter (Signed)
Dr. Jonny Ruiz approve refill see previous msg. Med has been call into [facility] spoke with [place]...lmb

## 2013-04-19 NOTE — Telephone Encounter (Signed)
PT called again requesting Ultran.  She will run out on Sunday.  She requests enough to last at least until Dr. Debby Bud is back on Monday.  [facility] on [address]

## 2013-04-19 NOTE — Telephone Encounter (Signed)
Dr. Jonny Ruiz, since Rene Kocher is gone she is asking for you to approve this, and notate do not fill until 04/21/13

## 2013-04-19 NOTE — Telephone Encounter (Signed)
There is an Rx by Dr. Jonny Ruiz for this

## 2013-04-19 NOTE — Telephone Encounter (Signed)
Faxed script back to [facility].../lmb 

## 2013-04-29 ENCOUNTER — Encounter (HOSPITAL_COMMUNITY): Payer: Self-pay | Admitting: Emergency Medicine

## 2013-04-29 ENCOUNTER — Emergency Department (HOSPITAL_COMMUNITY)
Admission: EM | Admit: 2013-04-29 | Discharge: 2013-04-29 | Disposition: A | Discharge status: Home / Self Care | Payer: No Typology Code available for payment source | Attending: Emergency Medicine | Admitting: Emergency Medicine

## 2013-04-29 DIAGNOSIS — L0501 Pilonidal cyst with abscess: Secondary | ICD-10-CM | POA: Insufficient documentation

## 2013-04-29 DIAGNOSIS — Z8619 Personal history of other infectious and parasitic diseases: Secondary | ICD-10-CM | POA: Insufficient documentation

## 2013-04-29 DIAGNOSIS — F3289 Other specified depressive episodes: Secondary | ICD-10-CM | POA: Insufficient documentation

## 2013-04-29 DIAGNOSIS — F329 Major depressive disorder, single episode, unspecified: Secondary | ICD-10-CM | POA: Insufficient documentation

## 2013-04-29 DIAGNOSIS — Z792 Long term (current) use of antibiotics: Secondary | ICD-10-CM | POA: Insufficient documentation

## 2013-04-29 DIAGNOSIS — Z79899 Other long term (current) drug therapy: Secondary | ICD-10-CM | POA: Insufficient documentation

## 2013-04-29 DIAGNOSIS — Z8719 Personal history of other diseases of the digestive system: Secondary | ICD-10-CM | POA: Insufficient documentation

## 2013-04-29 DIAGNOSIS — F411 Generalized anxiety disorder: Secondary | ICD-10-CM | POA: Insufficient documentation

## 2013-04-29 DIAGNOSIS — F17200 Nicotine dependence, unspecified, uncomplicated: Secondary | ICD-10-CM | POA: Insufficient documentation

## 2013-04-29 HISTORY — DX: Anxiety disorder, unspecified: F41.9

## 2013-04-29 MED ORDER — LIDOCAINE HCL (PF) 1 % IJ SOLN
30.0000 mL | Freq: Once | INTRAMUSCULAR | Status: AC
  Administered 2013-04-29: 30 mL

## 2013-04-29 MED ORDER — CLINDAMYCIN HCL 300 MG PO CAPS: 300.0000 mg | ORAL_CAPSULE | Freq: Three times a day (TID) | ORAL | Status: DC

## 2013-04-29 MED ORDER — OXYCODONE-ACETAMINOPHEN 5-325 MG PO TABS: 1.0000 | ORAL_TABLET | Freq: Four times a day (QID) | ORAL | Status: DC | PRN

## 2013-04-29 MED ORDER — ONDANSETRON 4 MG PO TBDP
4.0000 mg | ORAL_TABLET | Freq: Once | ORAL | Status: AC
  Administered 2013-04-29: 4 mg via ORAL
  Filled 2013-04-29: qty 1

## 2013-04-29 MED ORDER — FLUCONAZOLE 200 MG PO TABS: 200.0000 mg | ORAL_TABLET | Freq: Once | ORAL | Status: DC

## 2013-04-29 MED ORDER — OXYCODONE-ACETAMINOPHEN 5-325 MG PO TABS
2.0000 | ORAL_TABLET | Freq: Once | ORAL | Status: AC
  Administered 2013-04-29: 2 via ORAL
  Filled 2013-04-29: qty 2

## 2013-04-29 MED ORDER — CLINDAMYCIN HCL 300 MG PO CAPS
300.0000 mg | ORAL_CAPSULE | Freq: Once | ORAL | Status: AC
  Administered 2013-04-29: 300 mg via ORAL
  Filled 2013-04-29: qty 1

## 2013-04-29 NOTE — ED Notes (Signed)
Boil on top of sacrum that she mashed  It had some blood come out X a couple of days

## 2013-04-29 NOTE — ED Provider Notes (Signed)
CSN: [id number]     Arrival date & time 04/29/13  4098 History   First MD Initiated Contact with Patient 04/29/13 0734     Chief Complaint  Patient presents with  . Recurrent Skin Infections   (Consider location/radiation/quality/duration/timing/severity/associated sxs/prior Treatment) Patient is a 32 y.o. female presenting with abscess. The history is provided by the patient.  Abscess Location:  Ano-genital Ano-genital abscess location:  Gluteal cleft Size:  Large Abscess quality: fluctuance, painful and redness   Abscess quality: no warmth   Red streaking: no   Duration:  1 week (Had it several weeks ago and drained it at home, now developed over past week and worse) Progression:  Worsening Pain details:    Quality:  Pressure   Severity:  Moderate   Duration:  1 week   Timing:  Constant   Progression:  Worsening Chronicity:  New Context comment:  Pilonidal cyst Relieved by:  None tried Exacerbated by: palpation. Associated symptoms: no fever, no headaches, no nausea and no vomiting   Risk factors: prior abscess     Past Medical History  Diagnosis Date  . Depression   . Peptic ulcer disease   . Condyloma acuminata   . Hidradenitis suppurativa     recurrent  . Anxiety    Past Surgical History  Procedure Laterality Date  . Tubal ligation     Family History  Problem Relation Age of Onset  . Cancer Other     lung  . Diabetes Other     1st degree relative   History  Substance Use Topics  . Smoking status: Current Every Day Smoker -- 1.00 packs/day    Types: Cigarettes  . Smokeless tobacco: Not on file  . Alcohol Use: Yes     Comment: socially    OB History   Grav Para Term Preterm Abortions TAB SAB Ect Mult Living                 Review of Systems  Constitutional: Negative for fever.  HENT: Negative for rhinorrhea and sore throat.   Respiratory: Negative for cough and shortness of breath.   Cardiovascular: Negative for chest pain.  Gastrointestinal:  Negative for nausea and vomiting.  Skin: Negative for rash.  Neurological: Negative for headaches.  Hematological: Negative for adenopathy.  Psychiatric/Behavioral: Negative for agitation.    Allergies  Doxycycline  Home Medications   Current Outpatient Rx  Name  Route  Sig  Dispense  Refill  . EXPIRED: clindamycin (CLINDAMAX) 1 % lotion   Topical   Apply topically 2 (two) times daily.   60 mL   5   . Desoximetasone (TOPICORT) 0.25 % ointment      APPLY TOPICALLY TWICE DAILY   30 g   0   . fluconazole (DIFLUCAN) 150 MG tablet   Oral   Take 1 tablet (150 mg total) by mouth once.   1 tablet   0   . HYDROcodone-acetaminophen (NORCO/VICODIN) 5-325 MG per tablet   Oral   Take 1 tablet by mouth every 6 (six) hours as needed for pain.   90 tablet   0   . Multiple Vitamins-Calcium (ONE-A-DAY WOMENS FORMULA PO)   Oral   Take by mouth daily.           Marland Kitchen sulfamethoxazole-trimethoprim (BACTRIM DS,SEPTRA DS) 800-160 MG per tablet   Oral   Take 1 tablet by mouth 2 (two) times daily.   10 tablet   0   . sulfamethoxazole-trimethoprim (SEPTRA DS) 800-160  MG per tablet   Oral   Take 1 tablet by mouth 2 (two) times daily.   42 tablet   0   . traMADol (ULTRAM) 50 MG tablet      TAKE 1-2 TABLETS BY MOUTH FOUR TIMES DAILY AS NEEDED FOR PAIN   240 tablet   0    BP 114/75  Pulse 98  Temp(Src) 97.7 F (36.5 C)  Resp 16  SpO2 99% Physical Exam  Nursing note and vitals reviewed. Constitutional: She is oriented to person, place, and time. She appears well-developed and well-nourished.  HENT:  Head: Normocephalic and atraumatic.  Eyes: EOM are normal.  Neck: Normal range of motion.  Cardiovascular: Normal rate and regular rhythm.   Neurological: She is alert and oriented to person, place, and time.  Skin: Skin is warm and dry.  Large pilonidal abscess noted with mild erythema locally  Psychiatric: She has a normal mood and affect.    ED Course  INCISION AND  DRAINAGE Date/Time: 04/29/2013 8:43 AM Performed by: Simmie Davies Authorized by: Simmie Davies Consent: Verbal consent obtained. written consent not obtained. Risks and benefits: risks, benefits and alternatives were discussed Consent given by: patient Patient understanding: patient states understanding of the procedure being performed Patient identity confirmed: verbally with patient Time out: Immediately prior to procedure a "time out" was called to verify the correct patient, procedure, equipment, support staff and site/side marked as required. Type: abscess Body area: anogenital Location details: pilonidal Anesthesia: local infiltration Local anesthetic: lidocaine 1% with epinephrine Anesthetic total: 6 ml Scalpel size: 11 Incision type: single straight Complexity: simple Drainage: purulent and bloody Drainage amount: copious Wound treatment: wound left open Packing material: 1" iodoform gauze. Patient tolerance: Patient tolerated the procedure well with no immediate complications.   (including critical care time) Labs Review Labs Reviewed - No data to display Imaging Review No results found.  EKG Interpretation   None       MDM   1. Pilonidal abscess    Patient with recurrent pilonidal abscess. Drained with copious amounts of purulent material noted. Has mild local erythema, will treat with course of clindamycin. Recommend f/u with PCP in 2 days for wound check and packing removal and arrange f/u with surgeon for reevaluation as well. No systemic signs of illness, afebrile and nontoxic appearing, stable for d/c. Strict return instructions discussed and provided to the patient in writing at time of d/c.     Simmie Davies, NP 04/29/13 [phone]

## 2013-04-29 NOTE — ED Provider Notes (Signed)
Medical screening examination/treatment/procedure(s) were performed by non-physician practitioner and as supervising physician I was immediately available for consultation/collaboration.  EKG Interpretation   None         Zerline Melchior, MD 04/29/13 1721 

## 2013-04-29 NOTE — ED Notes (Signed)
NP at bedside.

## 2013-05-01 ENCOUNTER — Ambulatory Visit (INDEPENDENT_AMBULATORY_CARE_PROVIDER_SITE_OTHER): Payer: No Typology Code available for payment source | Admitting: Internal Medicine

## 2013-05-01 ENCOUNTER — Encounter: Payer: Self-pay | Admitting: Internal Medicine

## 2013-05-01 ENCOUNTER — Telehealth (INDEPENDENT_AMBULATORY_CARE_PROVIDER_SITE_OTHER): Payer: Self-pay

## 2013-05-01 ENCOUNTER — Telehealth: Payer: Self-pay | Admitting: Internal Medicine

## 2013-05-01 ENCOUNTER — Encounter (INDEPENDENT_AMBULATORY_CARE_PROVIDER_SITE_OTHER): Payer: No Typology Code available for payment source | Admitting: General Surgery

## 2013-05-01 VITALS — BP 126/78 | HR 92 | Temp 97.7°F | Wt 234.8 lb

## 2013-05-01 DIAGNOSIS — L0501 Pilonidal cyst with abscess: Secondary | ICD-10-CM

## 2013-05-01 MED ORDER — FLUCONAZOLE 200 MG PO TABS: 200.0000 mg | ORAL_TABLET | Freq: Once | ORAL | Status: DC

## 2013-05-01 MED ORDER — TRAMADOL HCL 50 MG PO TABS: 50.0000 mg | ORAL_TABLET | Freq: Four times a day (QID) | ORAL | Status: DC | PRN

## 2013-05-01 MED ORDER — OXYCODONE-ACETAMINOPHEN 5-325 MG PO TABS: 1.0000 | ORAL_TABLET | Freq: Four times a day (QID) | ORAL | Status: DC | PRN

## 2013-05-01 NOTE — Telephone Encounter (Signed)
Called and left message for patient to call our office regarding her appointment today.

## 2013-05-01 NOTE — Progress Notes (Signed)
  Subjective:    Patient ID: Tara Ward, female    DOB: 07-Nov-1980, 32 y.o.   MRN: [id number]  HPI Tara Ward was seen in the ED 11/3 for an abscess at the base of her spine. ED note reviewed. She had I&D and packing. Final impression of provider was a pilonidal cyst. She presents now for packing change.  She reports she has had some pain, some blood drainage. She has been taking clindamycin as prescribed and percocet plus tramadol for pain.. She denies any fevers or rigors, no peri-rectal pain.  Past Medical History  Diagnosis Date  . Depression   . Peptic ulcer disease   . Condyloma acuminata   . Hidradenitis suppurativa     recurrent  . Anxiety    Past Surgical History  Procedure Laterality Date  . Tubal ligation     Family History  Problem Relation Age of Onset  . Cancer Other     lung  . Diabetes Other     1st degree relative   History   Social History  . Marital Status: Married    Spouse Name: N/A    Number of Children: N/A  . Years of Education: N/A   Occupational History  . CNA, Med tech    Social History Main Topics  . Smoking status: Current Every Day Smoker -- 1.00 packs/day    Types: Cigarettes  . Smokeless tobacco: Not on file  . Alcohol Use: Yes     Comment: socially   . Drug Use: No  . Sexual Activity: Not on file   Other Topics Concern  . Not on file   Social History Narrative   No regular excerise    Current Outpatient Prescriptions on File Prior to Visit  Medication Sig Dispense Refill  . clindamycin (CLEOCIN) 300 MG capsule Take 1 capsule (300 mg total) by mouth 3 (three) times daily.  21 capsule  0  . clindamycin (CLINDAMAX) 1 % lotion Apply topically 2 (two) times daily.  60 mL  5   No current facility-administered medications on file prior to visit.      Review of Systems System review is negative for any constitutional, cardiac, pulmonary, GI or neuro symptoms or complaints other than as described in the HPI.      Objective:   Physical Exam Filed Vitals:   05/01/13 1340  BP: 126/78  Pulse: 92  Temp: 97.7 F (36.5 C)   Cor - RRR Pulm - normal respirations Derm - at the base of the spine/above the coccyx there is an open wound with packing.  After betadiene prep using sterile instruments 3 feets of 1" iodoform packing removed. The wound is very deep. With hemostates or single tooth forceps did not hit bottom of wound. The wound was repacked with 1" idodoform gauze - approximate 30 inches.        Assessment & Plan:  Pilonidal cyst - due to the depth of the wound patient is referred to same day DOD at [facility] for evaluation as to need for any surgical intervention. If she needs a series of packing changes as primary therapy will do that here, PCP, office since out of pocket cost is $30 vs $60 for specialty visits.   Plan Surgical consult  She is to complete clindamycin   Refilled percocet and tramadol  Rx for diflucan stat sent to pharmacy

## 2013-05-01 NOTE — Telephone Encounter (Signed)
05/01/2013   Pt called in to say that they could not be see by surgeons because she did not have her copay and won't have the copay for a few weeks.  Pt wants to know what they should do now.  Please advise.

## 2013-05-01 NOTE — Telephone Encounter (Signed)
I am surprised that she was turned away by [facility]. She will need to return on Friday for packing change. I will be here and able to see her. AM is preferred by me.

## 2013-05-01 NOTE — Progress Notes (Signed)
Pre-visit discussion using our clinic review tool. No additional management support is needed unless otherwise documented below in the visit note.  

## 2013-05-02 NOTE — Telephone Encounter (Signed)
05/02/2013  Left vm for message to call back.  See below for scheduling.

## 2013-05-03 ENCOUNTER — Encounter: Payer: Self-pay | Admitting: Internal Medicine

## 2013-05-03 ENCOUNTER — Ambulatory Visit (INDEPENDENT_AMBULATORY_CARE_PROVIDER_SITE_OTHER): Payer: No Typology Code available for payment source | Admitting: Internal Medicine

## 2013-05-03 VITALS — BP 100/70 | HR 80 | Temp 97.1°F | Wt 237.8 lb

## 2013-05-03 DIAGNOSIS — L732 Hidradenitis suppurativa: Secondary | ICD-10-CM

## 2013-05-03 NOTE — Progress Notes (Signed)
Pre visit review using our clinic review tool, if applicable. No additional management support is needed unless otherwise documented below in the visit note. 

## 2013-05-06 ENCOUNTER — Encounter: Payer: Self-pay | Admitting: Internal Medicine

## 2013-05-06 ENCOUNTER — Ambulatory Visit (INDEPENDENT_AMBULATORY_CARE_PROVIDER_SITE_OTHER): Payer: No Typology Code available for payment source | Admitting: Internal Medicine

## 2013-05-06 VITALS — BP 102/70 | HR 83 | Temp 98.8°F | Wt 237.0 lb

## 2013-05-06 DIAGNOSIS — Z5189 Encounter for other specified aftercare: Secondary | ICD-10-CM

## 2013-05-06 DIAGNOSIS — L0501 Pilonidal cyst with abscess: Secondary | ICD-10-CM

## 2013-05-06 NOTE — Progress Notes (Signed)
Pre visit review using our clinic review tool, if applicable. No additional management support is needed unless otherwise documented below in the visit note. 

## 2013-05-07 NOTE — Progress Notes (Signed)
  Subjective:    Patient ID: Tara Ward, female    DOB: 10/09/1980, 32 y.o.   MRN: [id number]  HPI Ms. Behlke returns for wound care - removal and repacking of abcess/pilonidal cyst at low back. Since Nov 5th she has done well: minimal pain, minimal drainage around the wound, no fevers.  \PMH, FamHx and SocHx reviewed for any changes and relevance.  No change in meds - has finished clindamycin.   Review of Systems System review is negative for any constitutional, cardiac, pulmonary, GI or neuro symptoms or complaints other than as described in the HPI.     Objective:   Physical Exam Filed Vitals:   05/06/13 1036  BP: 102/70  Pulse: 83  Temp: 98.8 F (37.1 C)   Derm- small opening abscess low back midline. Packing had come part way out. Iodoform was wet but there was no frank drainage from the wound. Repacked with 12-16 " iodoform gauze.      Assessment & Plan:  Wound care - repacked healing abscess/pilonidal cysts  Plan - return for wound care and packing removal Thursday, Nov 13th.

## 2013-05-09 ENCOUNTER — Ambulatory Visit (INDEPENDENT_AMBULATORY_CARE_PROVIDER_SITE_OTHER): Payer: No Typology Code available for payment source | Admitting: Internal Medicine

## 2013-05-09 ENCOUNTER — Encounter: Payer: Self-pay | Admitting: Internal Medicine

## 2013-05-09 VITALS — BP 96/70 | HR 80 | Temp 98.3°F | Wt 234.0 lb

## 2013-05-09 DIAGNOSIS — L0501 Pilonidal cyst with abscess: Secondary | ICD-10-CM

## 2013-05-09 NOTE — Progress Notes (Signed)
Pre visit review using our clinic review tool, if applicable. No additional management support is needed unless otherwise documented below in the visit note. 

## 2013-05-11 DIAGNOSIS — L0501 Pilonidal cyst with abscess: Secondary | ICD-10-CM | POA: Insufficient documentation

## 2013-05-11 NOTE — Progress Notes (Signed)
  Subjective:    Patient ID: Tara Ward, female    DOB: 31-Aug-1980, 32 y.o.   MRN: [id number]  HPI Cherine returns for wound care. She reports that the iodoform packing at the abscess/pilonidal cyst came out a couple of days ago. She has had no pain and no drainage.  PMH, FamHx and SocHx reviewed for any changes and relevance.  Current Outpatient Prescriptions on File Prior to Visit  Medication Sig Dispense Refill  . clindamycin (CLEOCIN) 300 MG capsule Take 1 capsule (300 mg total) by mouth 3 (three) times daily.  21 capsule  0  . oxyCODONE-acetaminophen (PERCOCET/ROXICET) 5-325 MG per tablet Take 1 tablet by mouth every 6 (six) hours as needed.  40 tablet  0  . traMADol (ULTRAM) 50 MG tablet Take 1-2 tablets (50-100 mg total) by mouth every 6 (six) hours as needed.  80 tablet  0  . clindamycin (CLINDAMAX) 1 % lotion Apply topically 2 (two) times daily.  60 mL  5   No current facility-administered medications on file prior to visit.     Review of Systems System review is negative for any constitutional, cardiac, pulmonary, GI or neuro symptoms or complaints other than as described in the HPI.     Objective:   Physical Exam Filed Vitals:   05/09/13 1253  BP: 96/70  Pulse: 80  Temp: 98.3 F (36.8 C)   Derm- small residual opening at base of spine that appeared closed. Explored with sterile Q-tip: wound is about 1/2 inch deep. Serosanguinous drainage on q-tip.       Assessment & Plan:

## 2013-05-11 NOTE — Assessment & Plan Note (Signed)
Cyst with deep sinus track base of spine which has done well and appears to have almost completely closed. No repeat packing at today's visit. Patient is advised that this may recur and she should be vigilant.

## 2013-05-15 ENCOUNTER — Other Ambulatory Visit: Payer: Self-pay | Admitting: Internal Medicine

## 2013-05-15 NOTE — Telephone Encounter (Signed)
Same qty?

## 2013-06-10 ENCOUNTER — Other Ambulatory Visit: Payer: Self-pay | Admitting: Internal Medicine

## 2013-06-12 ENCOUNTER — Telehealth: Payer: Self-pay

## 2013-06-12 NOTE — Telephone Encounter (Signed)
Phone call from patient requesting Tamiflu. She works in an assisted living and there has been an out break of the flu there over the weekend and she did work the weekend. She has had symptoms of fatigue, muscle soreness, headache, cough with yellow mucous since Monday.

## 2013-06-17 ENCOUNTER — Other Ambulatory Visit: Payer: Self-pay | Admitting: Internal Medicine

## 2013-06-27 ENCOUNTER — Other Ambulatory Visit: Payer: Self-pay | Admitting: Internal Medicine

## 2013-06-28 ENCOUNTER — Other Ambulatory Visit: Payer: Self-pay | Admitting: Internal Medicine

## 2013-06-28 NOTE — Telephone Encounter (Signed)
This med has been called in per previous message

## 2013-07-04 ENCOUNTER — Telehealth: Payer: Self-pay

## 2013-07-04 ENCOUNTER — Other Ambulatory Visit: Payer: Self-pay | Admitting: Internal Medicine

## 2013-07-04 NOTE — Telephone Encounter (Signed)
Refill on tramadol and she is requesting to have refills added so she does not have to call so soon. Increase qty?

## 2013-07-04 NOTE — Telephone Encounter (Signed)
2 tablets 3 times a day = 180 with 2 refills. Will need OV after that (routine management controlled substances)

## 2013-07-04 NOTE — Telephone Encounter (Signed)
The patient called hoping to get a refill on her pain medicine.

## 2013-07-05 MED ORDER — TRAMADOL HCL 50 MG PO TABS: 100.0000 mg | ORAL_TABLET | Freq: Three times a day (TID) | ORAL | Status: DC

## 2013-07-05 NOTE — Telephone Encounter (Signed)
Prescription has been phoned in. Attempted to notify patient but no answer and voicemail not set up. Put a note to he pharmacy that she will need an office visit before further refills.

## 2013-07-07 ENCOUNTER — Other Ambulatory Visit: Payer: Self-pay | Admitting: Internal Medicine

## 2013-07-08 NOTE — Telephone Encounter (Signed)
This has already been called in (see previous phone note)

## 2013-07-17 ENCOUNTER — Ambulatory Visit (INDEPENDENT_AMBULATORY_CARE_PROVIDER_SITE_OTHER): Payer: No Typology Code available for payment source | Admitting: Internal Medicine

## 2013-07-17 ENCOUNTER — Encounter (INDEPENDENT_AMBULATORY_CARE_PROVIDER_SITE_OTHER): Payer: Self-pay | Admitting: General Surgery

## 2013-07-17 ENCOUNTER — Encounter: Payer: Self-pay | Admitting: Internal Medicine

## 2013-07-17 ENCOUNTER — Ambulatory Visit (INDEPENDENT_AMBULATORY_CARE_PROVIDER_SITE_OTHER): Payer: No Typology Code available for payment source | Admitting: General Surgery

## 2013-07-17 VITALS — BP 110/82 | HR 86 | Temp 98.3°F | Wt 236.8 lb

## 2013-07-17 VITALS — BP 110/78 | HR 80 | Temp 97.5°F | Resp 16 | Ht 68.0 in | Wt 235.0 lb

## 2013-07-17 DIAGNOSIS — L0501 Pilonidal cyst with abscess: Secondary | ICD-10-CM

## 2013-07-17 MED ORDER — AMOXICILLIN-POT CLAVULANATE 875-125 MG PO TABS: 1.0000 | ORAL_TABLET | Freq: Two times a day (BID) | ORAL | Status: DC

## 2013-07-17 MED ORDER — OXYCODONE-ACETAMINOPHEN 5-325 MG PO TABS: 1.0000 | ORAL_TABLET | Freq: Four times a day (QID) | ORAL | Status: DC | PRN

## 2013-07-17 NOTE — Progress Notes (Signed)
Pre visit review using our clinic review tool, if applicable. No additional management support is needed unless otherwise documented below in the visit note. 

## 2013-07-17 NOTE — Progress Notes (Signed)
Subjective:     Patient ID: Tara Ward, female   DOB: 04-26-1981, 33 y.o.   MRN: [id number]  HPI 33 year old [place] referred by Dr. Debby BudNorins for evaluation of a recurrent pilonidal abscess. The patient had an area of swelling and pain above her right buttock in November. She went to the ER where she underwent incision and drainage. She followed up in Dr. Debby BudNorins office and it eventually resolved. About 2 days ago she noticed recurrent swelling and pain. She was given a prescription for Augmentin yesterday but hasn't started it. She denies any fevers or chills. She has a problem with hydradenitis in both axilla and along her manubrium.  PMHx, PSHx, SOCHx, FAMHx, ALL reviewed   Review of Systems 8 point ROS performed and negative except for above    Objective:   Physical Exam  Vitals reviewed. Constitutional: She appears well-developed and well-nourished. No distress.  HENT:  Head: Normocephalic and atraumatic.  Right Ear: External ear normal.  Left Ear: External ear normal.  Eyes: Conjunctivae are normal. No scleral icterus.  Abdominal: Soft. She exhibits no distension.  Musculoskeletal: She exhibits no edema.  Neurological: She is alert.  Skin: Skin is warm and dry. She is not diaphoretic. There is erythema.     Raised fluctuant area with induration just to right of gluteal cleft. Tiny pinpoint sinus in midline. Area of induration/fluctuance about 5cm x 3cm. Well healed old incision in midportion of infection  Psychiatric: She has a normal mood and affect. Her behavior is normal. Judgment and thought content normal.   BP 110/78  Pulse 80  Temp(Src) 97.5 F (36.4 C) (Oral)  Resp 16  Ht 5\' 8"  (1.727 m)  Wt 235 lb (106.595 kg)  BMI 35.74 kg/m2     Assessment:     Recurrent infected pilonidal abscess     Plan:     We discussed pilonidal disease. We discussed nonoperative and operative management. Said she has an acute infection and abscess I recommended  incising and draining the area today in the office. I discussed the importance of smoking cessation. After obtaining verbal consent the area was prepped with ChloraPrep. 2% Xylocaine with epinephrine was infiltrated over the area. Using an 11 blade I incised the skin directly over the area of maximal fluctuance to her old scar. A 1 inch incision was made. A large amount of purulent drainage came out of the wound. It was probed with a hemostat. I then packed the wound with quarter inch iodoform gauze. He was covered with dry gauze. I instructed her on wound care. I advised her to keep the packing in until Friday and then change the packing daily. She was advised that she may have to change the outer gauze tomorrow. She was instructed on what to call for. She was given a prescription for Percocet. She will follow up in office in 2 weeks. I explained that they did not have to pack the cavity deeply but just enough to keep the skin edges separated in order to prevent the skin edges for reapproximating for the cavity has sealed  Mary SellaEric M. Andrey CampanileWilson, MD, FACS General, Bariatric, & Minimally Invasive Surgery [facility], [place]

## 2013-07-17 NOTE — Patient Instructions (Signed)
Recurrent pilonidal abscess - last treatment November '14. Today there is a 4x5 cm raised lesion that is very consistent with recurrent abscess  Plan [facility]45 PM for a 4PM visit - there may be a bit of a wait  Augmentin 875 mg twice a day for 10 days with one refill  The surgeon's will manage this problem including any needed pain medications.

## 2013-07-17 NOTE — Patient Instructions (Signed)
Take antibiotic Stop smoking Abscess Care After  An abscess (also called a boil or furuncle) is an infected area that contains a collection of pus. Signs and symptoms of an abscess include pain, tenderness, redness, or hardness, or you may feel a moveable soft area under your skin. An abscess can occur anywhere in the body. The infection may spread to surrounding tissues causing cellulitis. A cut (incision) by the surgeon was made over your abscess and the pus was drained out. Gauze may have been packed into the space to provide a drain that will allow the cavity to heal from the inside outwards. The boil may be painful for 5 to 7 days. Most people with a boil do not have high fevers. Your abscess, if seen early, may not have localized, and may not have been lanced. If not, another appointment may be required for this if it does not get better on its own or with medications.  HOME CARE INSTRUCTIONS   Only take over-the-counter or prescription medicines for pain, discomfort, or fever as directed by your caregiver.  When you bathe, soak and then remove gauze & iodoform packs. You may then wash the wound gently with mild soapy water. Place a packing strip between the skin edges to keep the skin open and Cover with gauze and change daily.   SEEK IMMEDIATE MEDICAL CARE IF:   You develop increased pain, swelling, redness, drainage, or bleeding in the wound site.   You develop signs of generalized infection including muscle aches, chills, fever, or a general ill feeling.   An oral temperature above 102 F (38.9 C) develops, not controlled by medication.  See your caregiver for a recheck if you develop any of the symptoms described above. If medications (antibiotics) were prescribed, take them as directed.  Pilonidal Cyst A pilonidal cyst occurs when hairs get trapped (ingrown) beneath the skin in the crease between the buttocks over your sacrum (the bone under that crease). Pilonidal cysts are most  common in young men with a lot of body hair. When the cyst is ruptured (breaks) or leaking, fluid from the cyst may cause burning and itching. If the cyst becomes infected, it causes a painful swelling filled with pus (abscess). The pus and trapped hairs need to be removed (often by lancing) so that the infection can heal. However, recurrence is common and an operation may be needed to remove the cyst. HOME CARE INSTRUCTIONS   If the cyst was NOT INFECTED:  Keep the area clean and dry. Bathe or shower daily. Wash the area well with a germ-killing soap. Warm tub baths may help prevent infection and help with drainage. Dry the area well with a towel.  Avoid tight clothing to keep area as moisture free as possible.  Keep area between buttocks as free of hair as possible. A depilatory may be used.  If the cyst WAS INFECTED and needed to be drained:  Your caregiver packed the wound with gauze to keep the wound open. This allows the wound to heal from the inside outwards and continue draining.  Return for a wound check in 1 day or as suggested.  If you take tub baths or showers, repack the wound with gauze following them. Sponge baths (at the sink) are a good alternative.  If an antibiotic was ordered to fight the infection, take as directed.  Only take over-the-counter or prescription medicines for pain, discomfort, or fever as directed by your caregiver.  After the drain is removed, use  sitz baths for 20 minutes 4 times per day. Clean the wound gently with mild unscented soap, pat dry, and then apply a dry dressing. SEEK MEDICAL CARE IF:   You have increased pain, swelling, redness, drainage, or bleeding from the area.  You have a fever.  You have muscles aches, dizziness, or a general ill feeling. Document Released: 06/10/2000 Document Revised: 09/05/2011 Document Reviewed: 08/08/2008 [facility] Patient Information 2014 [place], [place].

## 2013-07-18 ENCOUNTER — Telehealth (INDEPENDENT_AMBULATORY_CARE_PROVIDER_SITE_OTHER): Payer: Self-pay

## 2013-07-18 NOTE — Telephone Encounter (Signed)
LMOM giving pt a f/u appt with Dr Andrey CampanileWilson for 2/4 arrive at 8:45/9:00

## 2013-07-18 NOTE — Progress Notes (Signed)
   Subjective:    Patient ID: Tara Ward, female    DOB: August 09, 1980, 33 y.o.   MRN: [id number]  HPI Ms. Tara Ward presents for recurrent abscess at the area of the right buttock at mid-line. In November she was seen at ED, diagnosed with pilonidal abscess. I&D done in ED and she was seen several times for packing changes in our office with eventual healing. She does have a history of Hidraenitis suppurativa.  PMH, FamHx and SocHx reviewed for any changes and relevance. Current Outpatient Prescriptions on File Prior to Visit  Medication Sig Dispense Refill  . clindamycin (CLEOCIN) 300 MG capsule Take 1 capsule (300 mg total) by mouth 3 (three) times daily.  21 capsule  0  . traMADol (ULTRAM) 50 MG tablet Take 2 tablets (100 mg total) by mouth 3 (three) times daily.  180 tablet  2  . clindamycin (CLINDAMAX) 1 % lotion Apply topically 2 (two) times daily.  60 mL  5   No current facility-administered medications on file prior to visit.      Review of Systems System review is negative for any constitutional, cardiac, pulmonary, GI or neuro symptoms or complaints other than as described in the HPI.     Objective:   Physical Exam Filed Vitals:   07/17/13 1033  BP: 110/82  Pulse: 86  Temp: 98.3 F (36.8 C)   Wt Readings from Last 3 Encounters:  07/17/13 235 lb (106.595 kg)  07/17/13 236 lb 12.8 oz (107.412 kg)  05/09/13 234 lb (106.142 kg)   Gen'l - overweight woman in no distress Cor - RRR Pulm - normal respirations Derm - area 3x5 cm right buttock at midline, raised, warm, fluctuant.       Assessment & Plan:

## 2013-07-18 NOTE — Assessment & Plan Note (Signed)
Recurrent pilonidal abscess.  Plan Same day referral to [facility] for surgical care.  Augmentin bid x 10 days.

## 2013-07-20 ENCOUNTER — Other Ambulatory Visit: Payer: Self-pay | Admitting: Internal Medicine

## 2013-07-31 ENCOUNTER — Ambulatory Visit (INDEPENDENT_AMBULATORY_CARE_PROVIDER_SITE_OTHER): Payer: No Typology Code available for payment source | Admitting: General Surgery

## 2013-07-31 ENCOUNTER — Other Ambulatory Visit: Payer: No Typology Code available for payment source

## 2013-07-31 ENCOUNTER — Encounter: Payer: Self-pay | Admitting: Internal Medicine

## 2013-07-31 ENCOUNTER — Ambulatory Visit (INDEPENDENT_AMBULATORY_CARE_PROVIDER_SITE_OTHER): Payer: No Typology Code available for payment source | Admitting: Internal Medicine

## 2013-07-31 ENCOUNTER — Encounter (INDEPENDENT_AMBULATORY_CARE_PROVIDER_SITE_OTHER): Payer: Self-pay | Admitting: General Surgery

## 2013-07-31 VITALS — BP 124/76 | HR 80 | Temp 98.0°F | Resp 14 | Ht 68.5 in | Wt 235.4 lb

## 2013-07-31 VITALS — BP 106/72 | HR 74 | Temp 97.1°F | Wt 236.0 lb

## 2013-07-31 DIAGNOSIS — L0501 Pilonidal cyst with abscess: Secondary | ICD-10-CM

## 2013-07-31 DIAGNOSIS — F17200 Nicotine dependence, unspecified, uncomplicated: Secondary | ICD-10-CM

## 2013-07-31 DIAGNOSIS — M549 Dorsalgia, unspecified: Secondary | ICD-10-CM

## 2013-07-31 DIAGNOSIS — L732 Hidradenitis suppurativa: Secondary | ICD-10-CM

## 2013-07-31 DIAGNOSIS — Z09 Encounter for follow-up examination after completed treatment for conditions other than malignant neoplasm: Secondary | ICD-10-CM

## 2013-07-31 DIAGNOSIS — M436 Torticollis: Secondary | ICD-10-CM

## 2013-07-31 DIAGNOSIS — G8929 Other chronic pain: Secondary | ICD-10-CM | POA: Insufficient documentation

## 2013-07-31 LAB — SEDIMENTATION RATE: Sed Rate: 10 mm/hr (ref 0–22)

## 2013-07-31 LAB — CK: Total CK: 428 U/L — ABNORMAL HIGH (ref 7–177)

## 2013-07-31 MED ORDER — OXYCODONE-ACETAMINOPHEN 5-325 MG PO TABS: 1.0000 | ORAL_TABLET | Freq: Four times a day (QID) | ORAL | Status: DC | PRN

## 2013-07-31 NOTE — Assessment & Plan Note (Addendum)
Patient is ready to try smoking cessation. Was on Chantix and stopped due to suicidal and homicidal dreams. Requests buproprion trial. Has tried nicotine patches in the past. Resources provided, including [place] Quitline (1800QUITNOW). Gave information about smoker's diary and preparing to quit.

## 2013-07-31 NOTE — Progress Notes (Signed)
Subjective:     Patient ID: Tara CrateShani J Ward, female   DOB: 24-Mar-1981, 33 y.o.   MRN: [id number]  HPI Patient feels like the tramadol is not as effective as it used to be, can take  8-9 pills without as much relief. Recieived 40 pills of percocet after I&D of cyst, felt that it managed stiffness and soreness much better. Pain flares during menstrual period.   Patient is also interested in smoking cessation. Has tried nicoderm patches, cold [place], 1 ppd history for 14 years. Hasn't been able to cut down from 1 pack.   Past Medical History  Diagnosis Date  . Depression   . Peptic ulcer disease   . Condyloma acuminata   . Hidradenitis suppurativa     recurrent  . Anxiety    Past Surgical History  Procedure Laterality Date  . Tubal ligation  2004   Family History  Problem Relation Age of Onset  . Cancer Other     lung  . Diabetes Other     1st degree relative   History   Social History  . Marital Status: Married    Spouse Name: N/A    Number of Children: N/A  . Years of Education: N/A   Occupational History  . CNA, Med tech    Social History Main Topics  . Smoking status: Current Every Day Smoker -- 1.00 packs/day    Types: Cigarettes  . Smokeless tobacco: Not on file  . Alcohol Use: Yes     Comment: socially   . Drug Use: No  . Sexual Activity: Not on file   Other Topics Concern  . Not on file   Social History Narrative   No regular excerise    Review of Systems Constitutional:  Negative for fever, chills, activity change and unexpected weight change.  HEENT:  Negative for hearing loss, ear pain, congestion,and postnasal drip. Negative for sore throat or swallowing problems.  Eyes: Negative for vision loss or change in visual acuity.  Respiratory: Negative for chest tightness and wheezing.    Cardiovascular: Negative for chest pain or palpitations.  Gastrointestinal: No change in bowel habit. No bloating or gas. No reflux or indigestion Genitourinary:  Negative for urgency, frequency, flank pain and difficulty urinating.  Musculoskeletal: Upper back pain and neck stiffness from cysts  Neurological: Negative for dizziness, tremors, weakness and headaches.  Psychiatric/Behavioral: Negative for behavioral problems and dysphoric mood.        Objective:   Physical Exam  Filed Vitals:   07/31/13 1133  BP: 106/72  Pulse: 74  Temp: 97.1 F (36.2 C)     General: Well developed, well nourished, NAD, appears stated age HEENT: NCAT, PERRLA, EOMI, Anicteic Sclera, mucous membranes moist.  Neck: Supple, no JVD, no masses  Cardiovascular: S1 S2 auscultated, no rubs, murmurs or gallops. Regular rate and rhythm.  Respiratory: Clear to auscultation bilaterally with equal chest rise  Extremities: warm, dry without cyanosis, clubbing, or edema  Neuro: Alert and Oriented x3, cranial nerves II-XII grossly intact.  Skin: large cysts on neck, chronic problem. Psych: Normal mood and affect with intact judgement and insight      Assessment:     Updated by problem     Plan:     Updated by problem

## 2013-07-31 NOTE — Patient Instructions (Signed)
Pilonidal Cyst A pilonidal cyst occurs when hairs get trapped (ingrown) beneath the skin in the crease between the buttocks over your sacrum (the bone under that crease). Pilonidal cysts are most common in young men with a lot of body hair. When the cyst is ruptured (breaks) or leaking, fluid from the cyst may cause burning and itching. If the cyst becomes infected, it causes a painful swelling filled with pus (abscess). The pus and trapped hairs need to be removed (often by lancing) so that the infection can heal. However, recurrence is common and an operation may be needed to remove the cyst. HOME CARE INSTRUCTIONS   If the cyst was NOT INFECTED:  Keep the area clean and dry. Bathe or shower daily. Wash the area well with a germ-killing soap. Warm tub baths may help prevent infection and help with drainage. Dry the area well with a towel.  Avoid tight clothing to keep area as moisture free as possible.  Keep area between buttocks as free of hair as possible. A depilatory may be used.  If the cyst WAS INFECTED and needed to be drained:  Your caregiver packed the wound with gauze to keep the wound open. This allows the wound to heal from the inside outwards and continue draining.  Return for a wound check in 1 day or as suggested.  If you take tub baths or showers, repack the wound with gauze following them. Sponge baths (at the sink) are a good alternative.  If an antibiotic was ordered to fight the infection, take as directed.  Only take over-the-counter or prescription medicines for pain, discomfort, or fever as directed by your caregiver.  After the drain is removed, use sitz baths for 20 minutes 4 times per day. Clean the wound gently with mild unscented soap, pat dry, and then apply a dry dressing. SEEK MEDICAL CARE IF:   You have increased pain, swelling, redness, drainage, or bleeding from the area.  You have a fever.  You have muscles aches, dizziness, or a general ill  feeling. Document Released: 06/10/2000 Document Revised: 09/05/2011 Document Reviewed: 08/08/2008 ExitCare Patient Information 2014 Exit[employer], [employer].  

## 2013-07-31 NOTE — Progress Notes (Signed)
Subjective:     Patient ID: Tara CrateShani J Vert, female   DOB: 1980/12/30, 33 y.o.   MRN: [id number]  HPI 33 year old [place] female comes in for followup after undergoing incision and drainage of an infected pilonidal abscess in the office on January 21. She completed her antibiotic course. She denies any groin pain. She denies any drainage. She denies any fevers or chills.  Review of Systems     Objective:   Physical Exam BP 124/76  Pulse 80  Temp(Src) 98 F (36.7 C) (Temporal)  Resp 14  Ht 5' 8.5" (1.74 m)  Wt 235 lb 6.4 oz (106.777 kg)  BMI 35.27 kg/m2 Smiling, in no apparent stress, nontoxic Gluteal cleft-well-healed vertical incision just to the right of the gluteal cleft. No surrounding cellulitis, induration or fluctuance. Nontender    Assessment:     Infected pilonidal abscess status post incision and drainage     Plan:     Overall the area looks good. There is no current signs of infection. We rediscussed pilonidal disease. I recommended keeping the area free of hair and dry. We discussed the likelihood of recurrence at some point in the future. We briefly discussed the role of surgical management. Patient has elected to do nonsurgical management for now which I think is appropriate. Followup as needed  Mary Sellaric M. Andrey CampanileWilson, MD, FACS General, Bariatric, & Minimally Invasive Surgery [facility] [place]

## 2013-07-31 NOTE — Progress Notes (Signed)
Pre visit review using our clinic review tool, if applicable. No additional management support is needed unless otherwise documented below in the visit note. 

## 2013-07-31 NOTE — Assessment & Plan Note (Addendum)
Patient reports she has been released by [facility] - wound healed.

## 2013-07-31 NOTE — Assessment & Plan Note (Signed)
Patient reports decreased pain control with tramadol after her last incision and drainage on 1/21. She was given a prescription for percocet 5/325 and reported much better pain control and ability to function on that medication.

## 2013-07-31 NOTE — Patient Instructions (Signed)
1) Neck Stiffness - We are trying to set appropriate goals with regards to pain management - Worry about addiction, GI effects, loss of effectiveness over time with narcotics - While we are figuring out cause of neck stiffness, will give a limited prescription for percocet. Use when pain is out of control.   2) Smoking Cessation - Smoking is physically and psychologically addicting. - Cessation never works until [employer] made up your mind.   A) Prepare to quit - Smoker's diary. Write down what you were doing and rate the cigarette from 1-3. Level 1: get between me and cigarette and you're in  trouble. Level 3: found it burning in the ash tray. Look at the level 1 cigarettes, see what you were doing and develop an alternative behavioral strategy.   B) Tell everyone you're going to quit smoking and ask for help. Set a quit date and then quit.    You Can Quit Smoking If you are ready to quit smoking or are thinking about it, congratulations! You have chosen to help yourself be healthier and live longer! There are lots of different ways to quit smoking. Nicotine gum, nicotine patches, a nicotine inhaler, or nicotine nasal spray can help with physical craving. Hypnosis, support groups, and medicines help break the habit of smoking. TIPS TO GET OFF AND STAY OFF CIGARETTES  Learn to predict your moods. Do not let a bad situation be your excuse to have a cigarette. Some situations in your life might tempt you to have a cigarette.  Ask friends and co-workers not to smoke around you.  Make your home smoke-free.  Never have "just one" cigarette. It leads to wanting another and another. Remind yourself of your decision to quit.  On a card, make a list of your reasons for not smoking. Read it at least the same number of times a day as you have a cigarette. Tell yourself everyday, "I do not want to smoke. I choose not to smoke."  Ask someone at home or work to help you with your plan to quit  smoking.  Have something planned after you eat or have a cup of coffee. Take a walk or get other exercise to perk you up. This will help to keep you from overeating.  Try a relaxation exercise to calm you down and decrease your stress. Remember, you may be tense and nervous the first two weeks after you quit. This will pass.  Find new activities to keep your hands busy. Play with a pen, coin, or rubber band. Doodle or draw things on paper.  Brush your teeth right after eating. This will help cut down the craving for the taste of tobacco after meals. You can try mouthwash too.  Try gum, breath mints, or diet candy to keep something in your mouth. IF YOU SMOKE AND WANT TO QUIT:  Do not stock up on cigarettes. Never buy a carton. Wait until one pack is finished before you buy another.  Never carry cigarettes with you at work or at home.  Keep cigarettes as far away from you as possible. Leave them with someone else.  Never carry matches or a lighter with you.  Ask yourself, "Do I need this cigarette or is this just a reflex?"  Bet with someone that you can quit. Put cigarette money in a piggy bank every morning. If you smoke, you give up the money. If you do not smoke, by the end of the week, you keep the money.  Keep  trying. It takes 21 days to change a habit!  Talk to your doctor about using medicines to help you quit. These include nicotine replacement gum, lozenges, or skin patches. Document Released: 04/09/2009 Document Revised: 09/05/2011 Document Reviewed: 04/09/2009 [facility] Patient Information 2014 [place]Care, [place].

## 2013-08-01 LAB — ANA: Anti Nuclear Antibody(ANA): NEGATIVE

## 2013-08-01 NOTE — Assessment & Plan Note (Signed)
Patient with shoulder girdle/upper back pain soft tissue that is intermittent not always associated with flare of suppurative posterior neck. She reports relief with percocet. Concern for underlying soft tissue ailment: mysositis vs PMR vs fibromyalgia. Discussed in detail with the patient and that narcotics are not a good long term treatment plan.  Plan Diagnostic lab: ESR, CK total, ANA  Short term renewal, #40, percocet  Follow up after labs returned.

## 2013-08-02 ENCOUNTER — Encounter: Payer: Self-pay | Admitting: Internal Medicine

## 2013-08-02 DIAGNOSIS — G8929 Other chronic pain: Secondary | ICD-10-CM

## 2013-08-02 DIAGNOSIS — M549 Dorsalgia, unspecified: Principal | ICD-10-CM

## 2013-08-21 ENCOUNTER — Ambulatory Visit (INDEPENDENT_AMBULATORY_CARE_PROVIDER_SITE_OTHER): Payer: No Typology Code available for payment source | Admitting: Internal Medicine

## 2013-08-21 ENCOUNTER — Encounter: Payer: Self-pay | Admitting: Internal Medicine

## 2013-08-21 ENCOUNTER — Other Ambulatory Visit (INDEPENDENT_AMBULATORY_CARE_PROVIDER_SITE_OTHER): Payer: No Typology Code available for payment source

## 2013-08-21 VITALS — BP 112/80 | HR 80 | Temp 97.4°F | Wt 240.4 lb

## 2013-08-21 DIAGNOSIS — Z0000 Encounter for general adult medical examination without abnormal findings: Secondary | ICD-10-CM

## 2013-08-21 DIAGNOSIS — E669 Obesity, unspecified: Secondary | ICD-10-CM

## 2013-08-21 DIAGNOSIS — G8929 Other chronic pain: Secondary | ICD-10-CM

## 2013-08-21 DIAGNOSIS — L0501 Pilonidal cyst with abscess: Secondary | ICD-10-CM

## 2013-08-21 DIAGNOSIS — L732 Hidradenitis suppurativa: Secondary | ICD-10-CM

## 2013-08-21 DIAGNOSIS — M549 Dorsalgia, unspecified: Secondary | ICD-10-CM

## 2013-08-21 DIAGNOSIS — Z0001 Encounter for general adult medical examination with abnormal findings: Secondary | ICD-10-CM | POA: Insufficient documentation

## 2013-08-21 LAB — COMPREHENSIVE METABOLIC PANEL
ALT: 13 U/L (ref 0–35)
AST: 16 U/L (ref 0–37)
Albumin: 4 g/dL (ref 3.5–5.2)
Alkaline Phosphatase: 60 U/L (ref 39–117)
BUN: 10 mg/dL (ref 6–23)
CO2: 26 mEq/L (ref 19–32)
Calcium: 9 mg/dL (ref 8.4–10.5)
Chloride: 106 mEq/L (ref 96–112)
Creatinine, Ser: 0.6 mg/dL (ref 0.4–1.2)
GFR: 156.33 mL/min (ref >60.00)
Glucose, Bld: 87 mg/dL (ref 70–99)
Potassium: 4.2 mEq/L (ref 3.5–5.1)
Sodium: 138 mEq/L (ref 135–145)
Total Bilirubin: 0.6 mg/dL (ref 0.3–1.2)
Total Protein: 7.1 g/dL (ref 6.0–8.3)

## 2013-08-21 LAB — LIPID PANEL
Cholesterol: 129 mg/dL (ref 0–200)
HDL: 37.8 mg/dL — ABNORMAL LOW (ref >39.00)
LDL Cholesterol: 67 mg/dL (ref 0–99)
Total CHOL/HDL Ratio: 3
Triglycerides: 122 mg/dL (ref 0.0–149.0)
VLDL: 24.4 mg/dL (ref 0.0–40.0)

## 2013-08-21 MED ORDER — TRAMADOL HCL 50 MG PO TABS: 100.0000 mg | ORAL_TABLET | Freq: Four times a day (QID) | ORAL | Status: DC | PRN

## 2013-08-21 NOTE — Patient Instructions (Signed)
Thanks for coming to see me. I hope that all goes well with the surgeon and the dermatologist and the rheumatologist. You are supporting the whole medical community. Thank you  Your general exam is good but you should loose some weight. Diet management: smart food choices, PORTION SIZE CONTROL, regular exercise. Goal - to loose 1-2 lbs.month. Target weight - 180 (5 year project, maybe less)  Routine lab is ordered for today and results will be posted to MyChart.  For continuing care I suggest you see Baltazar ApoNancy Hartman, PA

## 2013-08-21 NOTE — Assessment & Plan Note (Signed)
Discussed the importance of weight management in regard to her overall health.  Plan Diet management: smart food choices, PORTION SIZE CONTROL, regular exercise. Goal - to loose 1-2 lbs.month. Target weight - 200 lbs ( 32 lb loss over 16-32 months)

## 2013-08-21 NOTE — Progress Notes (Signed)
Subjective:    Patient ID: Tara Ward, female    DOB: 14-Oct-1980, 33 y.o.   MRN: [id number]  HPI Tara Ward presents for a wellness exam. She is looking for a gyn with her last exam being two years ago. She has had relapse of perirectal abscess and had to return to [facility] and had I&D. Still not a candidate for surgical excision.   She has been to [facility] for treatment - cost $1900 her cost per treatment and she will need 24 treatments!~!!! The treatment is a steroid injection followed by laser. She did see that the first treatment did help.   She is otherwise doing OK.   Past Medical History  Diagnosis Date  . Depression   . Peptic ulcer disease   . Condyloma acuminata   . Hidradenitis suppurativa     recurrent  . Anxiety    Past Surgical History  Procedure Laterality Date  . Tubal ligation  2004   Family History  Problem Relation Age of Onset  . Cancer Other     lung  . Diabetes Other     1st degree relative   History   Social History  . Marital Status: Married    Spouse Name: N/A    Number of Children: N/A  . Years of Education: N/A   Occupational History  . CNA, Med tech    Social History Main Topics  . Smoking status: Current Every Day Smoker -- 1.00 packs/day    Types: Cigarettes  . Smokeless tobacco: Not on file  . Alcohol Use: Yes     Comment: socially   . Drug Use: No  . Sexual Activity: Not on file   Other Topics Concern  . Not on file   Social History Narrative   No regular excerise    Current Outpatient Prescriptions on File Prior to Visit  Medication Sig Dispense Refill  . fluconazole (DIFLUCAN) 200 MG tablet TAKE 1 TABLET BY MOUTH ONCE  1 tablet  0  . oxyCODONE-acetaminophen (PERCOCET/ROXICET) 5-325 MG per tablet Take 1 tablet by mouth every 6 (six) hours as needed.  40 tablet  0   No current facility-administered medications on file prior to visit.     Review of Systems Constitutional:  Negative for fever, chills, activity change and  unexpected weight change.  HEENT:  Negative for hearing loss, ear pain, congestion, neck stiffness and postnasal drip. Negative for sore throat or swallowing problems. Negative for dental complaints.   Eyes: Negative for vision loss or change in visual acuity.  Respiratory: Negative for chest tightness and wheezing. Negative for DOE.   Cardiovascular: Negative for chest pain or palpitations. No decreased exercise tolerance Gastrointestinal: No change in bowel habit. No bloating or gas. No reflux or indigestion Genitourinary: Negative for urgency, frequency, flank pain and difficulty urinating.  Musculoskeletal: Negative for myalgias, back pain, arthralgias and gait problem.  Neurological: Negative for dizziness, tremors, weakness and headaches.  Hematological: Negative for adenopathy.  Psychiatric/Behavioral: Negative for behavioral problems and dysphoric mood.       Objective:   Physical Exam Filed Vitals:   08/21/13 0940  BP: 112/80  Pulse: 80  Temp: 97.4 F (36.3 C)   Wt Readings from Last 3 Encounters:  08/21/13 240 lb 6.4 oz (109.045 kg)  07/31/13 236 lb (107.049 kg)  07/31/13 235 lb 6.4 oz (106.777 kg)   Gen'l: well nourished, well developed, overweight Woman in no distress HEENT - [place]/AT, EACs/TMs normal, oropharynx with native dentition with csarious  premolar left mandible with mild gingivitis, dry socket right premolar , no buccal or palatal lesions, posterior pharynx clear, mucous membranes moist. C&S clear, PERRLA, fundi - normal Neck - supple, no thyromegaly Nodes- negative submental, cervical, supraclavicular regions Chest - no deformity, no CVAT Lungs - clear without rales, wheezes. No increased work of breathing Breast - deferred to gyn Cardiovascular - regular rate and rhythm, quiet precordium, no murmurs, rubs or gallops, 2+ radial, DP and PT pulses Abdomen - BS+ x 4, no HSM, no guarding or rebound or tenderness Pelvic - deferred to gyn Rectal - deferred to  gyn Extremities - no clubbing, cyanosis, edema or deformity.  Neuro - A&O x 3, CN II-XII normal, motor strength normal and equal, DTRs 2+ and symmetrical biceps, radial, and patellar tendons. Cerebellar - no tremor, no rigidity, fluid movement and normal gait. Derm - Head, neck, back, abdomen and extremities without suspicious lesions        Assessment & Plan:

## 2013-08-21 NOTE — Progress Notes (Signed)
Pre visit review using our clinic review tool, if applicable. No additional management support is needed unless otherwise documented below in the visit note. 

## 2013-08-21 NOTE — Assessment & Plan Note (Signed)
Patient with no evidence of myositis based on normal labs (CK-minimal elevation, CRP). She does have sig.s that raise an issue of rheumatologic disease/disorder: hyradnintis supprativa, minimally elevated CK.  Plan Rheumatology consult placed/

## 2013-08-21 NOTE — Assessment & Plan Note (Signed)
Interval h/x significant for recurrent pilonidal cyst x 3, myalgia w/o clear cause and [facility]. She has not had Gyn exam for 2-3 years but has not problems. Her preference is to establish with OB-gyn since she hope to start a family. Immunization is up to date. She does need dental work and has active Secretary/administratordental insurance. Discussed weight management. Too young for mammography.  In summary  a nice young woman who has problems as outlined above. She does maintain a good attitude and works hard at being healthy. She will transfer her care to Jabier MuttonNancy Hartmann, PA.

## 2013-08-21 NOTE — Assessment & Plan Note (Signed)
Continued problem Has [facility] and [facility]. Her out of pocket cost for treatment was $1900 and she needs many more treatment which she will get one at a time as she saves her money.

## 2013-08-21 NOTE — Assessment & Plan Note (Signed)
Edson SnowballShani has had another recurrent pilonidal abscess and has had repeat I&D at [facility]  Plan Per surgery - may come to excisional surgery

## 2013-08-23 ENCOUNTER — Encounter: Payer: Self-pay | Admitting: Internal Medicine

## 2013-09-28 NOTE — Progress Notes (Signed)
   Subjective:    Patient ID: Tara Ward, female    DOB: [birth year], 33 y.o.   MRN: [id number]  HPI No note - cancel visit   Review of Systems     Objective:   Physical Exam        Assessment & Plan:

## 2013-10-22 ENCOUNTER — Other Ambulatory Visit (HOSPITAL_COMMUNITY): Payer: Self-pay | Admitting: Obstetrics and Gynecology

## 2013-10-22 DIAGNOSIS — Z3141 Encounter for fertility testing: Secondary | ICD-10-CM

## 2013-10-23 ENCOUNTER — Other Ambulatory Visit (HOSPITAL_COMMUNITY): Payer: Self-pay | Admitting: Obstetrics and Gynecology

## 2013-10-23 DIAGNOSIS — Z8742 Personal history of other diseases of the female genital tract: Secondary | ICD-10-CM

## 2013-10-29 ENCOUNTER — Ambulatory Visit (HOSPITAL_COMMUNITY): Payer: No Typology Code available for payment source

## 2013-11-07 ENCOUNTER — Encounter (INDEPENDENT_AMBULATORY_CARE_PROVIDER_SITE_OTHER): Payer: No Typology Code available for payment source | Admitting: General Surgery

## 2013-11-07 ENCOUNTER — Other Ambulatory Visit (INDEPENDENT_AMBULATORY_CARE_PROVIDER_SITE_OTHER): Payer: Self-pay | Admitting: General Surgery

## 2013-11-11 ENCOUNTER — Encounter (INDEPENDENT_AMBULATORY_CARE_PROVIDER_SITE_OTHER): Payer: Self-pay | Admitting: General Surgery

## 2013-12-12 ENCOUNTER — Other Ambulatory Visit: Payer: Self-pay

## 2013-12-12 MED ORDER — TRAMADOL HCL 50 MG PO TABS: 100.0000 mg | ORAL_TABLET | Freq: Four times a day (QID) | ORAL | Status: DC | PRN

## 2013-12-12 NOTE — Telephone Encounter (Signed)
Called the patient informed of MD instructions to schedule ROV.  Transferred to a scheduler to try and find appointment for the patient.

## 2013-12-12 NOTE — Telephone Encounter (Signed)
Most recent rx for tramadol includes a note per Dr Debby BudNorins to ask for ROV to determine appropriateness of ongoing medical need

## 2013-12-12 NOTE — Telephone Encounter (Signed)
Patient was informed no appointment for Friday were available.  Transferred back to my phone.  The patient did inform she had spoken to someone in our office on Monday December 09, 2013 (The staff she stated was Asian) and was told to have pharmacy send prescription request and a MD would fill. Dr. Jonny RuizJohn has been informed of information and has offered to fill this prescription for now, but did inform the patient to schedule appointment as soon as she can.

## 2013-12-12 NOTE — Telephone Encounter (Signed)
cirumstances noted  Ok for one month med refill, for ROV eventually

## 2013-12-12 NOTE — Telephone Encounter (Signed)
Faxed hardcopy to [facility] [address] [place] 

## 2013-12-19 ENCOUNTER — Ambulatory Visit (INDEPENDENT_AMBULATORY_CARE_PROVIDER_SITE_OTHER): Payer: No Typology Code available for payment source | Admitting: Internal Medicine

## 2013-12-19 ENCOUNTER — Other Ambulatory Visit: Payer: No Typology Code available for payment source

## 2013-12-19 ENCOUNTER — Encounter: Payer: Self-pay | Admitting: Internal Medicine

## 2013-12-19 VITALS — BP 110/72 | HR 89 | Temp 98.6°F | Wt 231.0 lb

## 2013-12-19 DIAGNOSIS — R8290 Unspecified abnormal findings in urine: Secondary | ICD-10-CM

## 2013-12-19 DIAGNOSIS — R319 Hematuria, unspecified: Secondary | ICD-10-CM

## 2013-12-19 DIAGNOSIS — R82998 Other abnormal findings in urine: Secondary | ICD-10-CM

## 2013-12-19 DIAGNOSIS — R300 Dysuria: Secondary | ICD-10-CM

## 2013-12-19 DIAGNOSIS — L732 Hidradenitis suppurativa: Secondary | ICD-10-CM

## 2013-12-19 LAB — POCT URINALYSIS DIPSTICK
Glucose, UA: NEGATIVE
Ketones, UA: NEGATIVE
Nitrite, UA: POSITIVE
Protein, UA: 100
Spec Grav, UA: >=1.03
Urobilinogen, UA: 2
pH, UA: 6.5

## 2013-12-19 MED ORDER — PHENAZOPYRIDINE HCL 200 MG PO TABS: 200.0000 mg | ORAL_TABLET | Freq: Three times a day (TID) | ORAL | Status: DC | PRN

## 2013-12-19 MED ORDER — DESOXIMETASONE 0.25 % EX OINT: 1.0000 "application " | TOPICAL_OINTMENT | Freq: Two times a day (BID) | CUTANEOUS | Status: DC

## 2013-12-19 MED ORDER — NITROFURANTOIN MONOHYD MACRO 100 MG PO CAPS: 100.0000 mg | ORAL_CAPSULE | Freq: Two times a day (BID) | ORAL | Status: DC

## 2013-12-19 NOTE — Progress Notes (Signed)
   Subjective:    Patient ID: Tara Ward, female    DOB: 10/25/1980, 33 y.o.   MRN: [id number]  HPI The pt reports dysuria and difficulty urinating that began 12/15/13. The pt states she notices the dysuria towards the end of her urinary stream. The pt noticed frank red blood on the toilet paper when she wiped on Monday and states her urine was darker than usual. She is not menstruating. The blood has subsided since Monday. She took OTC "Azo" on Monday night, causing her urine to have remained orange since this time. She reports increased frequency, urgency, and pelvic pain and bladder spasming during urination. She specifically denies flank pain, fever/chills and back pain.  The pt's last period was 12/02/13. She has had increased vaginal discharge since Monday but is unable to describe the color due to the Azo. The discharge is thick and clumpy, but malodorous. She denies vaginal itch. Pt is married and sexually active. She does not use any form of birth control  Today the pt needs be re-evaluated for her tramadol prescription. Dr. Debby BudNorins gave her tramadol for a history of hydradenitis suppurativa. She also is requesting a cream she has used in the past but cannot recall the name of.   Review of Systems  Gastrointestinal: Negative for abdominal pain.  Genitourinary: Negative for flank pain, vaginal pain and menstrual problem.  Musculoskeletal: Negative for back pain.      Objective:   Physical Exam Hydradenitis suppurativa lesion on posterior cervical region with isolated pustule + L sided CVA tenderness  + mild suprapubic tenderness       Assessment & Plan:  #1 dysuria; would recommend nitrofurantoin x 7 days or consider a FQ to cover pyelo d/t +CVA tenderness.  #2 hematuria; see above  #3 hydradenitis suppurativa

## 2013-12-19 NOTE — Patient Instructions (Signed)
Drink as much nondairy fluids as possible. Avoid spicy foods or alcohol as  these may aggravate the bladder. Do not take decongestants. Avoid narcotics if possible. 

## 2013-12-19 NOTE — Progress Notes (Signed)
Pre visit review using our clinic review tool, if applicable. No additional management support is needed unless otherwise documented below in the visit note. 

## 2013-12-19 NOTE — Progress Notes (Signed)
   Subjective:    Patient ID: Tara Ward, female    DOB: 1980-09-26, 33 y.o.   MRN: [id number]  HPI  She describes dysuria and of urinations in 12/15/13. This is associated with hesitancy. On 6/22 she noticed some blood on the toilet tissue. Her urine was noted to be much darker.She is not menstruating & the bleeding has resolved. Last menses was 6/15. She's had some vaginal discharge which is described as thick & clumping but not malodorous. She used over-the-counter Azo which caused her urine to be dark. She continues to have urinary frequency, pelvic discomfort and bladder spasm.  Review of Systems  She denies flank discomfort, fever, chills, sweats, weight loss, abdominal pain, melanoma, rectal bleeding. She has a chronic skin condition Hidradenitis Suppurativa for which she is requesting a topical cream as well as refill of her tramadol.[facility] but can not continue therapy until outstanding bill paid.        Objective:   Physical Exam  General appearance :adequate nourishment w/o distress. Obesity present as per CDC Guidelines  Eyes: No conjunctival inflammation or scleral icterus is present.  Oral exam: Dental hygiene is good; lips and gums are healthy appearing.There is no oropharyngeal erythema or exudate noted.   Heart:  Normal rate and regular rhythm. S1 and S2 normal without gallop, murmur, click, rub or other extra sounds     Lungs:Chest clear to auscultation; no wheezes, rhonchi,rales ,or rubs present.No increased work of breathing.   Abdomen: Protuberant;bowel sounds normal, soft but slight supra pubic tenderness without masses, organomegaly or hernias noted.  No guarding or rebound . Slighy tenderness over the  L flank to percussion  Musculoskeletal: Able to lie flat and sit up without help. Negative straight leg raising bilaterally. Gait normal  Skin:Warm & dry. Large posterior neck keloid appearing lesion with superficial ulcer & clear  drainage.  Lymphatic: No lymphadenopathy is noted about the head, neck, axilla areas.                Assessment & Plan:  #1 dysuria #2 hematuria #3 hydradenitis suppurativa  See orders & AVS recommendations

## 2013-12-22 LAB — URINE CULTURE: Colony Count: >=100000

## 2014-01-03 ENCOUNTER — Emergency Department (HOSPITAL_COMMUNITY)
Admission: EM | Admit: 2014-01-03 | Discharge: 2014-01-03 | Disposition: A | Discharge status: Home / Self Care | Payer: No Typology Code available for payment source | Source: Home / Self Care | Attending: Family Medicine | Admitting: Family Medicine

## 2014-01-03 ENCOUNTER — Encounter (HOSPITAL_COMMUNITY): Payer: Self-pay | Admitting: Emergency Medicine

## 2014-01-03 DIAGNOSIS — M940 Chondrocostal junction syndrome [Tietze]: Secondary | ICD-10-CM

## 2014-01-03 MED ORDER — NAPROXEN 500 MG PO TABS: 500.0000 mg | ORAL_TABLET | Freq: Two times a day (BID) | ORAL | Status: DC

## 2014-01-03 MED ORDER — KETOROLAC TROMETHAMINE 60 MG/2ML IM SOLN
INTRAMUSCULAR | Status: AC
  Filled 2014-01-03: qty 2

## 2014-01-03 MED ORDER — KETOROLAC TROMETHAMINE 60 MG/2ML IM SOLN
60.0000 mg | Freq: Once | INTRAMUSCULAR | Status: AC
  Administered 2014-01-03: 60 mg via INTRAMUSCULAR

## 2014-01-03 MED ORDER — CYCLOBENZAPRINE HCL 5 MG PO TABS: ORAL_TABLET | ORAL | Status: DC

## 2014-01-03 MED ORDER — KETOROLAC TROMETHAMINE 30 MG/ML IJ SOLN
INTRAMUSCULAR | Status: AC
  Filled 2014-01-03: qty 1

## 2014-01-03 NOTE — Discharge Instructions (Signed)
Costochondritis Costochondritis, sometimes called Tietze syndrome, is a swelling and irritation (inflammation) of the tissue (cartilage) that connects your ribs with your breastbone (sternum). It causes pain in the chest and rib area. Costochondritis usually goes away on its own over time. It can take up to 6 weeks or longer to get better, especially if you are unable to limit your activities. CAUSES  Some cases of costochondritis have no known cause. Possible causes include:  Injury (trauma).  Exercise or activity such as lifting.  Severe coughing. SIGNS AND SYMPTOMS  Pain and tenderness in the chest and rib area.  Pain that gets worse when coughing or taking deep breaths.  Pain that gets worse with specific movements. DIAGNOSIS  Your health care provider will do a physical exam and ask about your symptoms. Chest X-rays or other tests may be done to rule out other problems. TREATMENT  Costochondritis usually goes away on its own over time. Your health care provider may prescribe medicine to help relieve pain. HOME CARE INSTRUCTIONS   Avoid exhausting physical activity. Try not to strain your ribs during normal activity. This would include any activities using chest, abdominal, and side muscles, especially if heavy weights are used.  Apply ice to the affected area for the first 2 days after the pain begins.  Put ice in a plastic bag.  Place a towel between your skin and the bag.  Leave the ice on for 20 minutes, 2-3 times a day.  Only take over-the-counter or prescription medicines as directed by your health care provider. SEEK MEDICAL CARE IF:  You have redness or swelling at the rib joints. These are signs of infection.  Your pain does not go away despite rest or medicine. SEEK IMMEDIATE MEDICAL CARE IF:   Your pain increases or you are very uncomfortable.  You have shortness of breath or difficulty breathing.  You cough up blood.  You have worse chest pains,  sweating, or vomiting.  You have a fever or persistent symptoms for more than 2-3 days.  You have a fever and your symptoms suddenly get worse. MAKE SURE YOU:   Understand these instructions.  Will watch your condition.  Will get help right away if you are not doing well or get worse. Document Released: 03/23/2005 Document Revised: 04/03/2013 Document Reviewed: 01/15/2013 ExitCare Patient Information 2015 [employer]. This information is not intended to replace advice given to you by your health care provider. Make sure you discuss any questions you have with your health care provider.  

## 2014-01-03 NOTE — ED Notes (Signed)
Pt assessed.  Pt reports on set of chest pain located in the center of her chest. Bilateral numbness in arms.  Pain also felt with deep breathing.  Denies nausea,vomiting,sweats, and any cardiac history.  Vitals are WNL.  Pt sent back to waiting area and is to inform front desk of any changes.  Pt voices understanding.  Mw,cma

## 2014-01-03 NOTE — ED Provider Notes (Signed)
CSN: [id number]     Arrival date & time 01/03/14  1904 History   First MD Initiated Contact with Patient 01/03/14 1958     Chief Complaint  Patient presents with  . Chest Pain   (Consider location/radiation/quality/duration/timing/severity/associated sxs/prior Treatment) HPI Comments: Patient reports a less than 24 hour onset of mid sternal chest pain. Pain with ROM, and deep breaths. No injury. Previous cold and cough, though this improved. No fever or chills. No N, V or diaphoresis. No history of heart condition/issues. No SOB.   Patient is a 33 y.o. female presenting with chest pain. The history is provided by the patient.  Chest Pain   Past Medical History  Diagnosis Date  . Depression   . Peptic ulcer disease   . Condyloma acuminata   . Hidradenitis suppurativa     recurrent  . Anxiety    Past Surgical History  Procedure Laterality Date  . Tubal ligation  2004   Family History  Problem Relation Age of Onset  . Cancer Other     lung  . Diabetes Other     1st degree relative   History  Substance Use Topics  . Smoking status: Current Every Day Smoker -- 1.00 packs/day    Types: Cigarettes  . Smokeless tobacco: Not on file  . Alcohol Use: Yes     Comment: socially    OB History   Grav Para Term Preterm Abortions TAB SAB Ect Mult Living                 Review of Systems  Cardiovascular: Positive for chest pain.  All other systems reviewed and are negative.   Allergies  Doxycycline  Home Medications   Prior to Admission medications   Medication Sig Start Date End Date Taking? Authorizing Provider  traMADol (ULTRAM) 50 MG tablet Take 2 tablets (100 mg total) by mouth every 6 (six) hours as needed. 12/12/13  Yes Corwin LevinsJames W John, MD  cyclobenzaprine (FLEXERIL) 5 MG tablet Take 1 a bedtime for chest discomfort 01/03/14   Riki SheerMichelle G Fritzi Scripter, PA-C  Desoximetasone (TOPICORT) 0.25 % ointment Apply 1 application topically 2 (two) times daily. 12/19/13   Pecola LawlessWilliam F Hopper, MD   fluconazole (DIFLUCAN) 200 MG tablet TAKE 1 TABLET BY MOUTH ONCE 07/20/13   Jacques NavyMichael E Norins, MD  naproxen (NAPROSYN) 500 MG tablet Take 1 tablet (500 mg total) by mouth 2 (two) times daily. 01/03/14   Riki SheerMichelle G Phoenyx Melka, PA-C  nitrofurantoin, macrocrystal-monohydrate, (MACROBID) 100 MG capsule Take 1 capsule (100 mg total) by mouth 2 (two) times daily. 12/19/13   Pecola LawlessWilliam F Hopper, MD  oxyCODONE-acetaminophen (PERCOCET/ROXICET) 5-325 MG per tablet Take 1 tablet by mouth every 6 (six) hours as needed. 07/31/13   Jacques NavyMichael E Norins, MD  phenazopyridine (PYRIDIUM) 200 MG tablet Take 1 tablet (200 mg total) by mouth 3 (three) times daily as needed for pain. 12/19/13   Pecola LawlessWilliam F Hopper, MD   BP 111/76  Pulse 79  Temp(Src) 98.8 F (37.1 C) (Oral)  Resp 20  SpO2 100%  LMP 01/02/2014 Physical Exam  Nursing note and vitals reviewed. Constitutional: She is oriented to person, place, and time. She appears well-developed and well-nourished. No distress.  Cardiovascular: Normal rate and regular rhythm.  Exam reveals no gallop and no friction rub.   No murmur heard. Pulmonary/Chest: Effort normal and breath sounds normal. No respiratory distress. She has no wheezes. She has no rales. She exhibits tenderness.  Pain to palpation along left sternal wall. Reaching  behind worsens pain.   Musculoskeletal: She exhibits tenderness. She exhibits no edema.  Neurological: She is alert and oriented to person, place, and time.  Skin: Skin is warm and dry. No rash noted. She is not diaphoretic.  Psychiatric: Her behavior is normal.    ED Course  Procedures (including critical care time) Labs Review Labs Reviewed - No data to display  Imaging Review No results found.   MDM   1. Costochondritis    Treat symptomatically with NSAIDs, night time muscle relaxer, and moist heat. F/U if worsens.     Riki Sheer, PA-C 01/03/14 2026

## 2014-01-03 NOTE — ED Notes (Signed)
Chest pain in center of chest.  Onset last night.  Pain consistent, and increasing in severity.  Movement makes pain worse.  Dull pain when standing still, sharp with movement.  Denies feeling this way before.  Denies sob, denies diaphoresis.  Denies nausea denies vomiting.

## 2014-01-04 NOTE — ED Provider Notes (Signed)
Medical screening examination/treatment/procedure(s) were performed by non-physician practitioner and as supervising physician I was immediately available for consultation/collaboration.  Leslee Homeavid Ermal Haberer, M.D.  Reuben Likesavid C Saw Mendenhall, MD 01/04/14 [phone]

## 2014-01-07 ENCOUNTER — Other Ambulatory Visit: Payer: Self-pay | Admitting: Internal Medicine

## 2014-01-07 ENCOUNTER — Telehealth: Payer: Self-pay | Admitting: Internal Medicine

## 2014-01-07 NOTE — Telephone Encounter (Signed)
Patient's pharmacy sent a refill request over for tramadol but was told that it was too early to refill.  Patient states she will be out tomorrow.  Please advise.

## 2014-01-07 NOTE — Telephone Encounter (Signed)
I dont feel comfortable with rx for high dose ultram for pt I have not examined due to recent increased scrutiny of physicians by the government  Needs OV for further refills, any md ok

## 2014-01-08 ENCOUNTER — Telehealth: Payer: Self-pay

## 2014-01-08 MED ORDER — TRAMADOL HCL 50 MG PO TABS: 50.0000 mg | ORAL_TABLET | Freq: Four times a day (QID) | ORAL | Status: DC | PRN

## 2014-01-08 NOTE — Telephone Encounter (Signed)
Called left message to call back 

## 2014-01-08 NOTE — Telephone Encounter (Signed)
Message copied by Pincus SanesEWING, Valia Wingard B on Wed Jan 08, 2014  2:55 PM ------      Message from: Pecola LawlessHOPPER, WILLIAM F      Created: Wed Jan 08, 2014  2:38 PM       She'll have to work this out with Pharmacy ------

## 2014-01-08 NOTE — Telephone Encounter (Signed)
The patient called back to inform her  pharmacy will not fill her Tramadol until tomorrow January 09, 2014. She has enough for today.  She can't get to the pharmacy until  After work tomorrow which means she will have no tramadol to take until tomorrow evening.  She is requesting a one time early refill, ok to fill today?

## 2014-01-08 NOTE — Telephone Encounter (Signed)
Informed the patient of MD instructions.  The patient was upset with decision.  Informed Physician Dr. Alwyn RenHopper who had seen this patient on 12/19/13 for Dysuria.  Dr. Alwyn RenHopper did ok #30, but informed this patient that would be all prescribed and would need to establish with a new PCP asap.  Printed new prescription, Dr. Alwyn RenHopper signed and faxed hardcopy to [facility] [address]

## 2014-01-08 NOTE — Telephone Encounter (Signed)
Patient informed Dr. Alwyn RenHopper declined an early refill for Tramadol. See telephone note dated 01/07/14

## 2014-01-08 NOTE — Telephone Encounter (Signed)
Received call from stephanie/ pharmacist with [facility] she stated just want to inform dr. Isidore Moosffice the history on the pt. Pt has been getting early refills on tramadol from the last 3 months from Dr. [facility]yne [facility]Phys[facility]. Pt just received refill on the tramadol and according to their records she is not due and should have some left. They have already inform pt that they will not fill today she can pick rx up tomorrow...Raechel Chute/lmb

## 2014-01-08 NOTE — Telephone Encounter (Signed)
Called the patient left message to call back 

## 2014-01-08 NOTE — Telephone Encounter (Signed)
The patient was informed Dr. Alwyn RenHopper declined to ok an early refill of tramadol.

## 2014-01-10 ENCOUNTER — Ambulatory Visit (INDEPENDENT_AMBULATORY_CARE_PROVIDER_SITE_OTHER): Payer: No Typology Code available for payment source | Admitting: Internal Medicine

## 2014-01-10 ENCOUNTER — Encounter: Payer: Self-pay | Admitting: Internal Medicine

## 2014-01-10 VITALS — BP 102/70 | HR 86 | Temp 98.3°F | Wt 238.8 lb

## 2014-01-10 DIAGNOSIS — G894 Chronic pain syndrome: Secondary | ICD-10-CM

## 2014-01-10 DIAGNOSIS — R0789 Other chest pain: Secondary | ICD-10-CM

## 2014-01-10 MED ORDER — TRAMADOL HCL 50 MG PO TABS: ORAL_TABLET | ORAL | Status: DC

## 2014-01-10 MED ORDER — SERTRALINE HCL 25 MG PO TABS: 25.0000 mg | ORAL_TABLET | Freq: Every day | ORAL | Status: DC

## 2014-01-10 NOTE — Progress Notes (Signed)
Pre visit review using our clinic review tool, if applicable. No additional management support is needed unless otherwise documented below in the visit note. 

## 2014-01-10 NOTE — Patient Instructions (Signed)
Your next office appointment will be determined based upon review of your pending labs & x-rays. Those instructions will be transmitted to you through My Chart Followup as needed for your acute issue. Please report any significant change in your symptoms. Consider glucosamine sulfate 1500 mg daily for joint symptoms. Take this daily  for 4-6 weeks. Use an anti-inflammatory cream such as Aspercreme or Zostrix cream twice a day to the affected area as needed. In lieu of this warm moist compresses or  hot water bottle can be used. Do not apply ice .

## 2014-01-10 NOTE — Progress Notes (Signed)
   Subjective:    Patient ID: Tara Ward, female    DOB: Nov 19, 1980, 33 y.o.   MRN: [id number]  HPI Pt was seen in UC 01/03/14 for chest soreness and bilateral arm pain that had been worsening over 2 days prior to her presentation. She was diagnosed with costochondritis and given flexeril and naproxen. Her pain went away for a few days but has since returned.   Today her arms are still mildly sore but much improved. The chest pain is the same as it was previously. There was no injury nor strain. The pain is located in the mid sternum. It is intermittent and described as being "punched in the chest". The pain lasts for a few minutes and then dissipates but a dull pain remains throughout the day. It is rated from a 2/10-7/10. She cannot specify what causes the pain but believes it is related to stress. She recently took a new job in April and states she has been under an increased amount of stress. There are no associated palpitations, radiation to arm or jaw, diaphoresis, nausea.   Review of Systems  Constitutional: Negative for fever.  Respiratory: Negative for shortness of breath.   Gastrointestinal:       No reflux        Objective:   Physical Exam        Assessment & Plan:

## 2014-01-10 NOTE — Progress Notes (Signed)
Subjective:    Patient ID: Tara Ward, female    DOB: 10-Aug-1980, 33 y.o.   MRN: [id number]  HPI  She was diagnosed with costochondritis on 01/03/14 @ the UC ; this was manifested as chest soreness and bilateral arm pain which had  progressed over 48 hours. She was given Flexeril and Naprosyn with resolution of the pain for several days; but it has recurred again w/o specific trigger.  She describes mild soreness in her arms but this is much improved compared to the UC visit. Chest pain is unchanged.  There was no injury or trigger for the initial pain or for its recurrence.  It is located in the midsternum and described as if "being punched in the chest". It does last for a few minutes and dissipates ; but there is a constant underlying dull pain which lasts throughout the day related as 2-7 on 10 in severity.  She feels that her chest pain is related to stress as she had recently taken a job in April with increased stress associated with this.    Review of Systems  She has no associated palpitations, chest pain radiation, diaphoresis, or nausea.  She is not having reflux symptoms such as hoarseness, significant dyspepsia, or dysphagia.  No fever, chills or sweats.  No dyspnea present.       Objective:   Physical Exam   Significant or distinguishing  findings on physical exam are documented first.  Below that are other systems examined & findings.   Classic costochondritis with palpation of the parasternal margins.  Equivocal Homans left calf  Slightly decreased posterior tibial pulses.  Gen.: Healthy and well-nourished in appearance. Alert, appropriate and cooperative throughout exam. Eyes: No corneal or conjunctival inflammation noted. Pupils equal round reactive to light and accommodation. Extraocular motion intact.  Ears: External  ear exam reveals no significant lesions or deformities.  Hearing is grossly normal bilaterally. Nose: External nasal exam reveals  no deformity or inflammation. Nasal mucosa are pink and moist. No lesions or exudates noted.   Mouth: Oral mucosa and oropharynx reveal no lesions or exudates. Teeth in good repair. Neck: lesions not re-examined; these are covered by hair piece Lungs: Normal respiratory effort; chest expands symmetrically. Lungs are clear to auscultation without rales, wheezes, or increased work of breathing. Heart: Normal rate and rhythm. Normal S1 and S2. No gallop, click, or rub. No murmur. Abdomen: Bowel sounds normal; abdomen soft and nontender. No masses, organomegaly or hernias noted.                              Musculoskeletal/extremities: No deformity or scoliosis noted of  the thoracic or lumbar spine.  No clubbing, cyanosis, edema, or significant extremity  deformity noted. Tone & strength normal. Hand joints normal Fingernail  health good. Able to lie down & sit up w/o help.  Vascular: Carotid, radial artery, dorsalis pedis and  posterior tibial pulses are full and equal. No bruits present. Neurologic: Alert and oriented x3.  Gait normal .       Skin: Intact without suspicious lesions or rashes. Lymph: No cervical, axillary lymphadenopathy present. Psych: Mood and affect are normal. Normally interactive  Assessment & Plan:  #1 classic costochondritis chest pain syndrome. Because of its lack of definite cause and its recurrence after resolution with appropriate treatment; cardiac enzymes, d-dimer, EKG, chest x-ray are indicated.  #2 chronic pain from the Hidraenitis Suppurativa  neck lesions. She is unable to afford the definitive therapy which is laser. She was very upset as she had previously taken tramadol 2 pills every 4 hours. The last Rx was written as 1 pill every 6 hours prn. hours. I had written a prescription for one every 8 hours. She wanted the higher dose with multiple refills. I explained that I  do not do chronic pain therapy. I also was concerned her serious problem is not being treated but only any associated pain. If she cannot afford laser; she should pursue other options to treat the significant dermatologic problems she has.  Plan: She'll be referred to the chronic pain Center.She demanded a printed referral sheet before she would leave. I attempted to do this but [facility] gave me only inpatient referral as option. I told her I would do this ASAP Monday 01/13/14.  A prescription for low dose (to minimize drug:drug interaction) sertraline was written for anxiety. With the chronic pain; she may be a candidate for generic Cymbalta instead as it does have some pain treatment modalities.  Note: Cxray & labs not completed. She stated : "You are just doing these to cover yourself if I die!"

## 2014-01-11 ENCOUNTER — Telehealth: Payer: Self-pay | Admitting: Internal Medicine

## 2014-01-11 NOTE — Telephone Encounter (Signed)
Relevant patient education mailed to patient.  

## 2014-01-13 ENCOUNTER — Telehealth: Payer: Self-pay | Admitting: Internal Medicine

## 2014-01-13 ENCOUNTER — Encounter: Payer: Self-pay | Admitting: Internal Medicine

## 2014-01-13 ENCOUNTER — Telehealth: Payer: Self-pay | Admitting: *Deleted

## 2014-01-13 DIAGNOSIS — M549 Dorsalgia, unspecified: Principal | ICD-10-CM

## 2014-01-13 DIAGNOSIS — G8929 Other chronic pain: Secondary | ICD-10-CM

## 2014-01-13 DIAGNOSIS — R0789 Other chest pain: Secondary | ICD-10-CM

## 2014-01-13 MED ORDER — TRAMADOL HCL 50 MG PO TABS: ORAL_TABLET | ORAL | Status: DC

## 2014-01-13 NOTE — Telephone Encounter (Signed)
Patient would like to speak with you in regards to an appointment she had this past Friday with Dr. Alwyn RenHopper.    Patient would like call as soon as possible.

## 2014-01-13 NOTE — Telephone Encounter (Signed)
Patient called back.  States that her visit with Dr. Alwyn RenHopper on Friday was a "terrible" experience.  She was very upset and voiced multiple complaints as follows: saw student more that Dr. Alwyn RenHopper, EKG was done to prevent lawyers coming back and holding him liable, rushed her to the lab, treated her like a drug addict, would not give her the dosage of Tramadol that she wanted.  I spoke with Dr. Alwyn RenHopper and he is going to prescribe Tramadol 1 q 6h PRN No Refills.  I advised Tara Ward of this decision.  She voiced understanding and awaits a call from our office regarding her referral to the pain clinic.

## 2014-01-13 NOTE — Telephone Encounter (Signed)
Printed tramadol 50 mg #120 with 0 refills per Dr. Frederik PearHopper's instructions, to be used prn only (noted on script no refills for 30 days).  Did cancel  the script given on Friday January 10, 2014 Tramadol 50 mg #30 to [employer] [address] Judeth CornfieldStephanie did question the diagnosis this prescription was being used for.  Dr. Alwyn RenHopper came to the phone, gave Judeth CornfieldStephanie the diagnosis and the prescription refill along with detailed instructions.  He did inform this patient is being referred to pain clinic.

## 2014-01-13 NOTE — Telephone Encounter (Signed)
Pain clinic referral place...Tara Ward/lmb

## 2014-01-13 NOTE — Telephone Encounter (Signed)
Message copied by Deatra JamesBRAND, Lambros Cerro M on Mon Jan 13, 2014  8:25 AM ------      Message from: Pecola LawlessHOPPER, WILLIAM F      Created: Mon Jan 13, 2014  6:36 AM       Can you help me place Chronic Pain Clinic referral ASAP. [facility] gives me only Inpatient option. Thanks for your help. ------

## 2014-01-13 NOTE — Telephone Encounter (Signed)
Patient left a message this morning requesting a call back.  I called her but had to leave a message for her to call back.

## 2014-01-21 ENCOUNTER — Telehealth: Payer: Self-pay | Admitting: Internal Medicine

## 2014-01-21 NOTE — Telephone Encounter (Signed)
She was given 3120 pills on 01/13/14 .This was to be 1 q 6 hrs prn . That should last until 8/20; appt is 8/12.

## 2014-01-21 NOTE — Telephone Encounter (Signed)
[facility] She states they can get the patient in on 02/05/14, but the patient stated she would not have enough medication to last until that date. Dr. Jordan LikesSpivey is requesting that we provide the patient with pain meds until she is seen 02/05/14, keeping in mind that they do not always prescribe meds at the first visit.

## 2014-02-06 ENCOUNTER — Telehealth: Payer: Self-pay | Admitting: *Deleted

## 2014-02-06 NOTE — Telephone Encounter (Signed)
Received called from Matt/pharmacist he stated pt is wanting to get tramadol 50 mg filled that was given by Dr. Alwyn RenHopper 7/28. On 02/03/14 she brought another rx for tramadol Er 300 mg written by Dr. Alvino ChapelVenezela slad-hartman, and she has picked up. Wanting to inform md of the 2 rx's for tramadol. Wanting to verify if the authorization for tramadol 50 need to be filled. Inform Matt not to fill because pt just received the tramadol Er with higher mg. Susy FrizzleMatt will void script...Raechel Chute/lmb

## 2014-02-20 ENCOUNTER — Ambulatory Visit (INDEPENDENT_AMBULATORY_CARE_PROVIDER_SITE_OTHER): Payer: No Typology Code available for payment source | Admitting: Surgery

## 2014-02-20 ENCOUNTER — Encounter (INDEPENDENT_AMBULATORY_CARE_PROVIDER_SITE_OTHER): Payer: Self-pay | Admitting: Surgery

## 2014-02-20 VITALS — BP 114/70 | HR 82 | Temp 98.8°F | Resp 16 | Ht 69.0 in | Wt 237.8 lb

## 2014-02-20 DIAGNOSIS — L0501 Pilonidal cyst with abscess: Secondary | ICD-10-CM

## 2014-02-20 MED ORDER — AMOXICILLIN-POT CLAVULANATE 875-125 MG PO TABS: 1.0000 | ORAL_TABLET | Freq: Two times a day (BID) | ORAL | Status: AC

## 2014-02-20 NOTE — Patient Instructions (Signed)
Changed external dressing as needed for drainage.  Remove packing after soaking in shower in 2-3 days.  Cover wound with dry gauze dressing following removal of packing.  Velora Heckler, MD, [facility] Office: [phone]

## 2014-02-20 NOTE — Progress Notes (Signed)
[facility]  Chief Complaint  Patient presents with  . Follow-up    recurrent infection in pilonidal cyst    HISTORY: Patient is a 33 year old female seen in January 2015 by Dr. Gaynelle Adu for pilonidal abscess.  She underwent incision and drainage. He discussed definitive surgical excision.  Patient has done well until the past 3 days when she has developed pain and swelling. She presents today for evaluation.  PERTINENT REVIEW OF SYSTEMS: 3 day history of pain, swelling, but no significant drainage.  EXAM: HEENT: normocephalic; pupils equal and reactive; sclerae clear; dentition good; mucous membranes moist NECK:  symmetric on extension; no palpable anterior or posterior cervical lymphadenopathy; no supraclavicular masses; no tenderness CHEST: clear to auscultation bilaterally without rales, rhonchi, or wheezes CARDIAC: regular rate and rhythm without significant murmur; peripheral pulses are full EXT:  non-tender without edema; no deformity GU:  Well-healed surgical scar upper natal cleft; underlying fluctuant abscess with tenderness and induration measuring approximately 4 x 5 cm in dimension NEURO: no gross focal deficits; no sign of tremor   PROCEDURE: Under aseptic conditions using local anesthetic, a 1.5 cm incision is made with a #15 blade into the abscess cavity. Approximately 30 cc of foul-smelling purulent fluid is evacuated from the cavity. Approximately 46 cm of half-inch iodoform gauze packing is placed into the abscess cavity and the wound was covered with a dry gauze dressing. Patient tolerated the procedure well.  IMPRESSION: Pilonidal cyst with recurrent abscess  PLAN: Patient will be started on oral antibiotics for one week. Wound care instructions are given. Patient will remove the packing in 2-3 days.  Patient was offered narcotic analgesics but refused.  Patient will return to see Dr. Andrey Campanile for wound check and discuss  definitive management in 4 days.  Velora Heckler, MD, [facility] Office: [phone]  Visit Diagnoses: 1. Pilonidal cyst with abscess

## 2014-02-25 ENCOUNTER — Encounter (INDEPENDENT_AMBULATORY_CARE_PROVIDER_SITE_OTHER): Payer: No Typology Code available for payment source | Admitting: General Surgery

## 2014-03-18 ENCOUNTER — Ambulatory Visit: Payer: No Typology Code available for payment source | Admitting: Internal Medicine

## 2014-03-18 DIAGNOSIS — Z0289 Encounter for other administrative examinations: Secondary | ICD-10-CM

## 2014-07-20 ENCOUNTER — Encounter (HOSPITAL_COMMUNITY): Payer: Self-pay | Admitting: Emergency Medicine

## 2014-07-20 ENCOUNTER — Emergency Department (HOSPITAL_COMMUNITY)
Admission: EM | Admit: 2014-07-20 | Discharge: 2014-07-20 | Discharge status: Against Medical Advice | Payer: No Typology Code available for payment source | Attending: Emergency Medicine | Admitting: Emergency Medicine

## 2014-07-20 DIAGNOSIS — Z720 Tobacco use: Secondary | ICD-10-CM | POA: Insufficient documentation

## 2014-07-20 DIAGNOSIS — L089 Local infection of the skin and subcutaneous tissue, unspecified: Secondary | ICD-10-CM | POA: Insufficient documentation

## 2014-07-20 NOTE — ED Notes (Signed)
Pt called for room. No response. ?

## 2014-07-20 NOTE — ED Notes (Signed)
Pt called. Did not answer.

## 2014-07-20 NOTE — ED Notes (Signed)
Pt called for third time. No answer.

## 2014-07-20 NOTE — ED Notes (Addendum)
Pt from home c/o recurrent pilonidal cyst. Symptoms ( redness, edema, and pain) began about 4 days ago. No drainage present.  Hx of same . Taking Ultram for pain without relief.

## 2014-09-05 ENCOUNTER — Ambulatory Visit (INDEPENDENT_AMBULATORY_CARE_PROVIDER_SITE_OTHER): Payer: BLUE CROSS/BLUE SHIELD | Admitting: Family

## 2014-09-05 ENCOUNTER — Encounter: Payer: Self-pay | Admitting: Family

## 2014-09-05 VITALS — BP 100/68 | HR 85 | Temp 98.6°F | Resp 18 | Ht 69.0 in | Wt 240.0 lb

## 2014-09-05 DIAGNOSIS — L732 Hidradenitis suppurativa: Secondary | ICD-10-CM

## 2014-09-05 MED ORDER — FLUCONAZOLE 150 MG PO TABS: 150.0000 mg | ORAL_TABLET | Freq: Once | ORAL | Status: DC

## 2014-09-05 MED ORDER — CLINDAMYCIN HCL 300 MG PO CAPS: 300.0000 mg | ORAL_CAPSULE | Freq: Four times a day (QID) | ORAL | Status: DC

## 2014-09-05 NOTE — Progress Notes (Signed)
   Subjective:    Patient ID: Tara Ward, female    DOB: [birth year], 34 y.o.   MRN: [id number]  Chief Complaint  Patient presents with  . Establish Care    Pt has Hidradenitis suppurativa and states that shes been having more flare ups lately didn't know if there was an antibiotic or topical ointment that could be used    HPI:  Tara Ward is a 34 y.o. female who presents today to establish care with this provider and discuss her Hidradentitis suppurativa.     Preferred Pain Management is managing the pain.   1) Hidradentitis suppurativa - Associated symptoms of flair ups generally happen about 1-2 times per month that usually occurs around her menstrual cycle. Has noted small sites under each breast, at the top of her buttock and and around her stomach. Currently pain is treated with tramadol. She is not currently taking any antibiotics. Patient has been seen by multiple dermatologists including [facility], however stopped attending secondary to cost.   Allergies  Allergen Reactions  . Doxycycline Swelling    SWELLING IN OPTIC NERVE.    Current Outpatient Prescriptions on File Prior to Visit  Medication Sig Dispense Refill  . Desoximetasone (TOPICORT) 0.25 % ointment Apply 1 application topically 2 (two) times daily. 60 g 1  . traMADol (ULTRAM) 50 MG tablet Take 100 mg by mouth every 6 (six) hours as needed for moderate pain.    . traMADol (ULTRAM-ER) 300 MG 24 hr tablet Take 300 mg by mouth daily.     No current facility-administered medications on file prior to visit.    Review of Systems  Constitutional: Negative for fever and chills.  Skin: Positive for rash.      Objective:    BP 100/68 mmHg  Pulse 85  Temp(Src) 98.6 F (37 C) (Oral)  Resp 18  Ht 5\' 9"  (1.753 m)  Wt 240 lb (108.863 kg)  BMI 35.43 kg/m2  SpO2 99% Nursing note and vital signs reviewed.  Physical Exam  Constitutional: She is oriented to person, place, and  time. She appears well-developed and well-nourished. No distress.  Cardiovascular: Normal rate, regular rhythm, normal heart sounds and intact distal pulses.   Pulmonary/Chest: Effort normal and breath sounds normal.  Neurological: She is alert and oriented to person, place, and time.  Skin: Skin is warm and dry.  Small ruptured areas noted under each breast, on her stomach and just superior to her buttocks. No discharge presently.   Psychiatric: She has a normal mood and affect. Her behavior is normal. Judgment and thought content normal.       Assessment & Plan:

## 2014-09-05 NOTE — Progress Notes (Signed)
Pre visit review using our clinic review tool, if applicable. No additional management support is needed unless otherwise documented below in the visit note. 

## 2014-09-05 NOTE — Patient Instructions (Signed)
Thank you for choosing [facility].  Summary/Instructions:   If your symptoms worsen or fail to improve, please contact our office for further instruction, or in case of emergency go directly to the emergency room at the closest medical facility.   Hidradenitis Suppurativa, Sweat Gland Abscess Hidradenitis suppurativa is a long lasting (chronic), uncommon disease of the sweat glands. With this, boil-like lumps and scarring develop in the groin, some times under the arms (axillae), and under the breasts. It may also uncommonly occur behind the ears, in the crease of the buttocks, and around the genitals.  CAUSES  The cause is from a blocking of the sweat glands. They then become infected. It may cause drainage and odor. It is not contagious. So it cannot be given to someone else. It most often shows up in puberty (about 2110 to 32 years of age). But it may happen much later. It is similar to acne which is a disease of the sweat glands. This condition is slightly more common in African-Americans and women. SYMPTOMS   Hidradenitis usually starts as one or more red, tender, swellings in the groin or under the arms (axilla).  Over a period of hours to days the lesions get larger. They often open to the skin surface, draining clear to yellow-colored fluid.  The infected area heals with scarring. DIAGNOSIS  Your caregiver makes this diagnosis by looking at you. Sometimes cultures (growing germs on plates in the lab) may be taken. This is to see what germ (bacterium) is causing the infection.  TREATMENT   Topical germ killing medicine applied to the skin (antibiotics) are the treatment of choice. Antibiotics taken by mouth (systemic) are sometimes needed when the condition is getting worse or is severe.  Avoid tight-fitting clothing which traps moisture in.  Dirt does not cause hidradenitis and it is not caused by poor hygiene.  Involved areas should be cleaned daily using an antibacterial  soap. Some patients find that the liquid form of Lever 2000, applied to the involved areas as a lotion after bathing, can help reduce the odor related to this condition.  Sometimes surgery is needed to drain infected areas or remove scarred tissue. Removal of large amounts of tissue is used only in severe cases.  Birth control pills may be helpful.  Oral retinoids (vitamin A derivatives) for 6 to 12 months which are effective for acne may also help this condition.  Weight loss will improve but not cure hidradenitis. It is made worse by being overweight. But the condition is not caused by being overweight.  This condition is more common in people who have had acne.  It may become worse under stress. There is no medical cure for hidradenitis. It can be controlled, but not cured. The condition usually continues for years with periods of getting worse and getting better (remission). Document Released: 01/26/2004 Document Revised: 09/05/2011 Document Reviewed: 09/13/2013 [facility] Patient Information 2015 [employer] [place]. This information is not intended to replace advice given to you by your health care provider. Make sure you discuss any questions you have with your health care provider.

## 2014-09-05 NOTE — Assessment & Plan Note (Signed)
Appear to be flare ups of hidrandentitis suppurativa. Start clindamycin. Pending tolerance of clindamycin, start clindamycin cream as needed. Start fluconazole as needed for candidiasis. Follow up if symptoms worsen or fail to improve.

## 2014-10-06 ENCOUNTER — Ambulatory Visit: Payer: No Typology Code available for payment source | Admitting: Internal Medicine

## 2014-10-28 ENCOUNTER — Encounter: Payer: Self-pay | Admitting: Family

## 2014-11-06 ENCOUNTER — Telehealth: Payer: Self-pay | Admitting: Family

## 2014-11-06 ENCOUNTER — Ambulatory Visit: Payer: BLUE CROSS/BLUE SHIELD | Admitting: Family

## 2014-11-06 DIAGNOSIS — Z0289 Encounter for other administrative examinations: Secondary | ICD-10-CM

## 2014-11-06 NOTE — Telephone Encounter (Signed)
May reschedule if she calls back.  

## 2014-11-06 NOTE — Telephone Encounter (Signed)
Patient no showed for pain management visit.  Please advise.

## 2014-11-07 NOTE — Telephone Encounter (Signed)
noted 

## 2014-11-10 ENCOUNTER — Other Ambulatory Visit: Payer: Self-pay | Admitting: Family

## 2014-11-10 DIAGNOSIS — L732 Hidradenitis suppurativa: Secondary | ICD-10-CM

## 2014-11-11 MED ORDER — CLINDAMYCIN PHOSPHATE 1 % EX SOLN: Freq: Two times a day (BID) | CUTANEOUS | Status: DC

## 2014-11-11 MED ORDER — CLINDAMYCIN HCL 300 MG PO CAPS: 300.0000 mg | ORAL_CAPSULE | Freq: Four times a day (QID) | ORAL | Status: DC

## 2014-11-11 NOTE — Telephone Encounter (Signed)
Medication refilled and sent to pharmacy.

## 2014-11-11 NOTE — Addendum Note (Signed)
Addended by: Jeanine LuzALONE, Maylynn Orzechowski D on: 11/11/2014 10:39 AM   Modules accepted: Orders

## 2014-11-17 ENCOUNTER — Ambulatory Visit: Payer: BLUE CROSS/BLUE SHIELD | Admitting: Family

## 2014-12-01 ENCOUNTER — Ambulatory Visit: Payer: BLUE CROSS/BLUE SHIELD | Admitting: Family

## 2014-12-01 DIAGNOSIS — Z0289 Encounter for other administrative examinations: Secondary | ICD-10-CM

## 2015-01-09 ENCOUNTER — Other Ambulatory Visit: Payer: Self-pay | Admitting: Family

## 2015-01-25 ENCOUNTER — Emergency Department (HOSPITAL_COMMUNITY)
Admission: EM | Admit: 2015-01-25 | Discharge: 2015-01-25 | Disposition: A | Discharge status: Home / Self Care | Payer: 59 | Source: Home / Self Care | Attending: Family Medicine | Admitting: Family Medicine

## 2015-01-25 ENCOUNTER — Encounter (HOSPITAL_COMMUNITY): Payer: Self-pay | Admitting: Emergency Medicine

## 2015-01-25 DIAGNOSIS — M546 Pain in thoracic spine: Secondary | ICD-10-CM | POA: Diagnosis not present

## 2015-01-25 DIAGNOSIS — M62830 Muscle spasm of back: Secondary | ICD-10-CM | POA: Diagnosis not present

## 2015-01-25 MED ORDER — DICLOFENAC SODIUM 75 MG PO TBEC: DELAYED_RELEASE_TABLET | ORAL | Status: DC

## 2015-01-25 MED ORDER — CYCLOBENZAPRINE HCL 10 MG PO TABS
ORAL_TABLET | ORAL | Status: DC
Start: 2015-01-25 — End: 2015-07-02

## 2015-01-25 NOTE — ED Provider Notes (Signed)
CSN: [id number]     Arrival date & time 01/25/15  1509 History   First MD Initiated Contact with Patient 01/25/15 1540     Chief Complaint  Patient presents with  . Back Pain   (Consider location/radiation/quality/duration/timing/severity/associated sxs/prior Treatment) HPI Comments: Patient is a 34 yo black female who presents with right sided upper back pain. Onset Tuesday while doing a squat challenge. She felt a "pull" in the right upper back, and over the next few days it continued to worsen. She denies extremity weakness or numbness. No loss of bowel or bladder control.   The history is provided by the patient.    Past Medical History  Diagnosis Date  . Depression   . Peptic ulcer disease   . Condyloma acuminata   . Hidradenitis suppurativa     recurrent  . Pilonidal cyst   . Anxiety    Past Surgical History  Procedure Laterality Date  . Tubal ligation  2004   Family History  Problem Relation Age of Onset  . Cancer Other     lung  . Diabetes Other     1st degree relative  . Cancer Paternal Uncle     gastric  . Hyperthyroidism Mother   . Diabetes Father    History  Substance Use Topics  . Smoking status: Current Every Day Smoker -- 1.00 packs/day for 15 years    Types: Cigarettes  . Smokeless tobacco: Never Used  . Alcohol Use: Yes     Comment: socially    OB History    No data available     Review of Systems  Musculoskeletal: Positive for myalgias and back pain. Negative for joint swelling, gait problem and neck pain.  Neurological: Negative for weakness.  All other systems reviewed and are negative.   Allergies  Doxycycline  Home Medications   Prior to Admission medications   Medication Sig Start Date End Date Taking? Authorizing Provider  clindamycin (CLEOCIN) 300 MG capsule TAKE 1 CAPSULE FOUR TIMES DAILY 01/12/15   Veryl Speak, FNP  clindamycin (CLEOCIN-T) 1 % external solution Apply topically 2 (two) times daily. 11/11/14   Veryl Speak, FNP  cyclobenzaprine (FLEXERIL) 10 MG tablet 1/2 to 1 tablet every 8 hours prn muscle spasm 01/25/15   Riki Sheer, PA-C  Desoximetasone (TOPICORT) 0.25 % ointment Apply 1 application topically 2 (two) times daily. 12/19/13   Pecola Lawless, MD  diclofenac (VOLTAREN) 75 MG EC tablet 1 po bid pc x 2 weeks then prn pain 01/25/15   Riki Sheer, PA-C  fluconazole (DIFLUCAN) 150 MG tablet Take 1 tablet (150 mg total) by mouth once. 09/05/14   Veryl Speak, FNP  traMADol (ULTRAM) 50 MG tablet Take 100 mg by mouth every 6 (six) hours as needed for moderate pain.    Historical Provider, MD  traMADol (ULTRAM-ER) 300 MG 24 hr tablet Take 300 mg by mouth daily.    Historical Provider, MD   BP 113/80 mmHg  Pulse 75  Temp(Src) 98.2 F (36.8 C) (Oral)  Resp 16  SpO2 97% Physical Exam  Constitutional: She is oriented to person, place, and time. She appears well-developed and well-nourished. No distress.  HENT:  Head: Normocephalic and atraumatic.  Pulmonary/Chest: Effort normal.  Musculoskeletal:  Mild spasm noted in the upper right lat region, tender to palpation. Full ROM of the thoracic and lumbar spine, no spinal tenderness  Neurological: She is alert and oriented to person, place, and time. She displays normal  reflexes. She exhibits normal muscle tone. Coordination normal.  Skin: Skin is warm and dry. No rash noted. She is not diaphoretic.  Psychiatric: Her behavior is normal.  Nursing note and vitals reviewed.   ED Course  Procedures (including critical care time) Labs Review Labs Reviewed - No data to display  Imaging Review No results found.   MDM   1. Muscle spasm of back   2. Thoracic back pain, unspecified back pain laterality    Radiographs not indicated. Treat symptomatically with NSAIDs, Muscle relaxer's, heat and gentle stretching. She is already in pain management for a glandular problem and will f/u there if she finds no relief. ([place] [facility] [employer]  clear)   Riki Sheer, PA-C 01/25/15 1609

## 2015-01-25 NOTE — ED Notes (Signed)
Pt states that she was leaning forward to stretch and felt something pull in her back.

## 2015-01-25 NOTE — Discharge Instructions (Signed)
Back Exercises Back exercises help treat and prevent back injuries. The goal of back exercises is to increase the strength of your abdominal and back muscles and the flexibility of your back. These exercises should be started when you no longer have back pain. Back exercises include:  Pelvic Tilt. Lie on your back with your knees bent. Tilt your pelvis until the lower part of your back is against the floor. Hold this position 5 to 10 sec and repeat 5 to 10 times.  Knee to Chest. Pull first 1 knee up against your chest and hold for 20 to 30 seconds, repeat this with the other knee, and then both knees. This may be done with the other leg straight or bent, whichever feels better.  Sit-Ups or Curl-Ups. Bend your knees 90 degrees. Start with tilting your pelvis, and do a partial, slow sit-up, lifting your trunk only 30 to 45 degrees off the floor. Take at least 2 to 3 seconds for each sit-up. Do not do sit-ups with your knees out straight. If partial sit-ups are difficult, simply do the above but with only tightening your abdominal muscles and holding it as directed.  Hip-Lift. Lie on your back with your knees flexed 90 degrees. Push down with your feet and shoulders as you raise your hips a couple inches off the floor; hold for 10 seconds, repeat 5 to 10 times.  Back arches. Lie on your stomach, propping yourself up on bent elbows. Slowly press on your hands, causing an arch in your low back. Repeat 3 to 5 times. Any initial stiffness and discomfort should lessen with repetition over time.  Shoulder-Lifts. Lie face down with arms beside your body. Keep hips and torso pressed to floor as you slowly lift your head and shoulders off the floor. Do not overdo your exercises, especially in the beginning. Exercises may cause you some mild back discomfort which lasts for a few minutes; however, if the pain is more severe, or lasts for more than 15 minutes, do not continue exercises until you see your caregiver.  Improvement with exercise therapy for back problems is slow.  See your caregivers for assistance with developing a proper back exercise program. Document Released: 07/21/2004 Document Revised: 09/05/2011 Document Reviewed: 04/14/2011 [facility] Patient Information 2015 [place], [place]. This information is not intended to replace advice given to you by your health care provider. Make sure you discuss any questions you have with your health care provider.    You have a muscle spasm. Treat with ice, massage, as needed muscle relaxer's and daily anti-inflammatories. Start gentle exercises after 24 hours.

## 2015-01-29 ENCOUNTER — Other Ambulatory Visit: Payer: Self-pay | Admitting: Obstetrics and Gynecology

## 2015-02-02 LAB — CYTOLOGY - PAP

## 2015-02-16 ENCOUNTER — Other Ambulatory Visit (HOSPITAL_COMMUNITY): Payer: Self-pay | Admitting: Obstetrics and Gynecology

## 2015-02-16 DIAGNOSIS — Z3141 Encounter for fertility testing: Secondary | ICD-10-CM

## 2015-02-16 DIAGNOSIS — Z8759 Personal history of other complications of pregnancy, childbirth and the puerperium: Secondary | ICD-10-CM

## 2015-02-18 ENCOUNTER — Encounter: Payer: Self-pay | Admitting: Family

## 2015-02-19 ENCOUNTER — Other Ambulatory Visit: Payer: Self-pay

## 2015-02-24 ENCOUNTER — Ambulatory Visit (HOSPITAL_COMMUNITY)
Admission: RE | Admit: 2015-02-24 | Discharge: 2015-02-24 | Disposition: A | Discharge status: Home / Self Care | Payer: 59 | Source: Ambulatory Visit | Attending: Obstetrics and Gynecology | Admitting: Obstetrics and Gynecology

## 2015-02-24 DIAGNOSIS — Z8759 Personal history of other complications of pregnancy, childbirth and the puerperium: Secondary | ICD-10-CM | POA: Insufficient documentation

## 2015-02-24 DIAGNOSIS — N971 Female infertility of tubal origin: Secondary | ICD-10-CM | POA: Insufficient documentation

## 2015-02-24 MED ORDER — IOHEXOL 300 MG/ML  SOLN
30.0000 mL | Freq: Once | INTRAMUSCULAR | Status: DC | PRN
  Administered 2015-02-24: 30 mL
  Filled 2015-02-24: qty 30

## 2015-03-12 ENCOUNTER — Other Ambulatory Visit: Payer: Self-pay | Admitting: Internal Medicine

## 2015-03-26 ENCOUNTER — Other Ambulatory Visit: Payer: Self-pay | Admitting: Family

## 2015-03-27 MED ORDER — CLINDAMYCIN HCL 300 MG PO CAPS: 300.0000 mg | ORAL_CAPSULE | Freq: Four times a day (QID) | ORAL | Status: DC

## 2015-06-02 ENCOUNTER — Other Ambulatory Visit: Payer: Self-pay | Admitting: Family

## 2015-06-23 ENCOUNTER — Other Ambulatory Visit: Payer: Self-pay | Admitting: Family

## 2015-06-23 NOTE — Telephone Encounter (Signed)
Please advise, thanks.

## 2015-07-02 ENCOUNTER — Ambulatory Visit (INDEPENDENT_AMBULATORY_CARE_PROVIDER_SITE_OTHER): Payer: 59 | Admitting: Family

## 2015-07-02 ENCOUNTER — Encounter: Payer: Self-pay | Admitting: Family

## 2015-07-02 VITALS — BP 108/70 | HR 70 | Temp 97.9°F | Resp 18 | Ht 69.0 in | Wt 242.0 lb

## 2015-07-02 DIAGNOSIS — F17200 Nicotine dependence, unspecified, uncomplicated: Secondary | ICD-10-CM

## 2015-07-02 DIAGNOSIS — L732 Hidradenitis suppurativa: Secondary | ICD-10-CM

## 2015-07-02 MED ORDER — CLINDAMYCIN HCL 300 MG PO CAPS: 300.0000 mg | ORAL_CAPSULE | Freq: Four times a day (QID) | ORAL | Status: DC

## 2015-07-02 MED ORDER — DESOXIMETASONE 0.25 % EX OINT: TOPICAL_OINTMENT | CUTANEOUS | Status: DC

## 2015-07-02 NOTE — Patient Instructions (Signed)
Thank you for choosing [facility].  Summary/Instructions:  Your prescription(s) have been submitted to your pharmacy or been printed and provided for you. Please take as directed and contact our office if you believe you are having problem(s) with the medication(s) or have any questions.  If your symptoms worsen or fail to improve, please contact our office for further instruction, or in case of emergency go directly to the emergency room at the closest medical facility.    Smoking Cessation, Tips for Success If you are ready to quit smoking, congratulations! You have chosen to help yourself be healthier. Cigarettes bring nicotine, tar, carbon monoxide, and other irritants into your body. Your lungs, heart, and blood vessels will be able to work better without these poisons. There are many different ways to quit smoking. Nicotine gum, nicotine patches, a nicotine inhaler, or nicotine nasal spray can help with physical craving. Hypnosis, support groups, and medicines help break the habit of smoking. WHAT THINGS CAN I DO TO MAKE QUITTING EASIER?  Here are some tips to help you quit for good:  Pick a date when you will quit smoking completely. Tell all of your friends and family about your plan to quit on that date.  Do not try to slowly cut down on the number of cigarettes you are smoking. Pick a quit date and quit smoking completely starting on that day.  Throw away all cigarettes.   Clean and remove all ashtrays from your home, work, and car.  On a card, write down your reasons for quitting. Carry the card with you and read it when you get the urge to smoke.  Cleanse your body of nicotine. Drink enough water and fluids to keep your urine clear or pale yellow. Do this after quitting to flush the nicotine from your body.  Learn to predict your moods. Do not let a bad situation be your excuse to have a cigarette. Some situations in your life might tempt you into wanting a  cigarette.  Never have "just one" cigarette. It leads to wanting another and another. Remind yourself of your decision to quit.  Change habits associated with smoking. If you smoked while driving or when feeling stressed, try other activities to replace smoking. Stand up when drinking your coffee. Brush your teeth after eating. Sit in a different chair when you read the paper. Avoid alcohol while trying to quit, and try to drink fewer caffeinated beverages. Alcohol and caffeine may urge you to smoke.  Avoid foods and drinks that can trigger a desire to smoke, such as sugary or spicy foods and alcohol.  Ask people who smoke not to smoke around you.  Have something planned to do right after eating or having a cup of coffee. For example, plan to take a walk or exercise.  Try a relaxation exercise to calm you down and decrease your stress. Remember, you may be tense and nervous for the first 2 weeks after you quit, but this will pass.  Find new activities to keep your hands busy. Play with a pen, coin, or rubber band. Doodle or draw things on paper.  Brush your teeth right after eating. This will help cut down on the craving for the taste of tobacco after meals. You can also try mouthwash.   Use oral substitutes in place of cigarettes. Try using lemon drops, carrots, cinnamon sticks, or chewing gum. Keep them handy so they are available when you have the urge to smoke.  When you have the urge  to smoke, try deep breathing.  Designate your home as a nonsmoking area.  If you are a heavy smoker, ask your health care provider about a prescription for nicotine chewing gum. It can ease your withdrawal from nicotine.  Reward yourself. Set aside the cigarette money you save and buy yourself something nice.  Look for support from others. Join a support group or smoking cessation program. Ask someone at home or at work to help you with your plan to quit smoking.  Always ask yourself, "Do I need this  cigarette or is this just a reflex?" Tell yourself, "Today, I choose not to smoke," or "I do not want to smoke." You are reminding yourself of your decision to quit.  Do not replace cigarette smoking with electronic cigarettes (commonly called e-cigarettes). The safety of e-cigarettes is unknown, and some may contain harmful chemicals.  If you relapse, do not give up! Plan ahead and think about what you will do the next time you get the urge to smoke. HOW WILL I FEEL WHEN I QUIT SMOKING? You may have symptoms of withdrawal because your body is used to nicotine (the addictive substance in cigarettes). You may crave cigarettes, be irritable, feel very hungry, cough often, get headaches, or have difficulty concentrating. The withdrawal symptoms are only temporary. They are strongest when you first quit but will go away within 10-14 days. When withdrawal symptoms occur, stay in control. Think about your reasons for quitting. Remind yourself that these are signs that your body is healing and getting used to being without cigarettes. Remember that withdrawal symptoms are easier to treat than the major diseases that smoking can cause.  Even after the withdrawal is over, expect periodic urges to smoke. However, these cravings are generally short lived and will go away whether you smoke or not. Do not smoke! WHAT RESOURCES ARE AVAILABLE TO HELP ME QUIT SMOKING? Your health care provider can direct you to community resources or hospitals for support, which may include:  Group support.  Education.  Hypnosis.  Therapy.   This information is not intended to replace advice given to you by your health care provider. Make sure you discuss any questions you have with your health care provider.   Document Released: 03/11/2004 Document Revised: 07/04/2014 Document Reviewed: 11/29/2012 Elsevier Interactive Patient Education 2016 [employer].  Steps to Quit Smoking  Smoking tobacco can be harmful to your  health and can affect almost every organ in your body. Smoking puts you, and those around you, at risk for developing many serious chronic diseases. Quitting smoking is difficult, but it is one of the best things that you can do for your health. It is never too late to quit. WHAT ARE THE BENEFITS OF QUITTING SMOKING? When you quit smoking, you lower your risk of developing serious diseases and conditions, such as:  Lung cancer or lung disease, such as COPD.  Heart disease.  Stroke.  Heart attack.  Infertility.  Osteoporosis and bone fractures. Additionally, symptoms such as coughing, wheezing, and shortness of breath may get better when you quit. You may also find that you get sick less often because your body is stronger at fighting off colds and infections. If you are pregnant, quitting smoking can help to reduce your chances of having a baby of low birth weight. HOW DO I GET READY TO QUIT? When you decide to quit smoking, create a plan to make sure that you are successful. Before you quit:  Pick a date to quit.  Set a date within the next two weeks to give you time to prepare.  Write down the reasons why you are quitting. Keep this list in places where you will see it often, such as on your bathroom mirror or in your car or wallet.  Identify the people, places, things, and activities that make you want to smoke (triggers) and avoid them. Make sure to take these actions:  Throw away all cigarettes at home, at work, and in your car.  Throw away smoking accessories, such as Set designerashtrays and lighters.  Clean your car and make sure to empty the ashtray.  Clean your home, including curtains and carpets.  Tell your family, friends, and coworkers that you are quitting. Support from your loved ones can make quitting easier.  Talk with your health care provider about your options for quitting smoking.  Find out what treatment options are covered by your health insurance. WHAT STRATEGIES CAN  I USE TO QUIT SMOKING?  Talk with your healthcare provider about different strategies to quit smoking. Some strategies include:  Quitting smoking altogether instead of gradually lessening how much you smoke over a period of time. Research shows that quitting "cold [place]key" is more successful than gradually quitting.  Attending in-person counseling to help you build problem-solving skills. You are more likely to have success in quitting if you attend several counseling sessions. Even short sessions of 10 minutes can be effective.  Finding resources and support systems that can help you to quit smoking and remain smoke-free after you quit. These resources are most helpful when you use them often. They can include:  Online chats with a Veterinary surgeoncounselor.  Telephone quitlines.  Printed Materials engineerself-help materials.  Support groups or group counseling.  Text messaging programs.  Mobile phone applications.  Taking medicines to help you quit smoking. (If you are pregnant or breastfeeding, talk with your health care provider first.) Some medicines contain nicotine and some do not. Both types of medicines help with cravings, but the medicines that include nicotine help to relieve withdrawal symptoms. Your health care provider may recommend:  Nicotine patches, gum, or lozenges.  Nicotine inhalers or sprays.  Non-nicotine medicine that is taken by mouth. Talk with your health care provider about combining strategies, such as taking medicines while you are also receiving in-person counseling. Using these two strategies together makes you more likely to succeed in quitting than if you used either strategy on its own. If you are pregnant or breastfeeding, talk with your health care provider about finding counseling or other support strategies to quit smoking. Do not take medicine to help you quit smoking unless told to do so by your health care provider. WHAT THINGS CAN I DO TO MAKE IT EASIER TO QUIT? Quitting  smoking might feel overwhelming at first, but there is a lot that you can do to make it easier. Take these important actions:  Reach out to your family and friends and ask that they support and encourage you during this time. Call telephone quitlines, reach out to support groups, or work with a counselor for support.  Ask people who smoke to avoid smoking around you.  Avoid places that trigger you to smoke, such as bars, parties, or smoke-break areas at work.  Spend time around people who do not smoke.  Lessen stress in your life, because stress can be a smoking trigger for some people. To lessen stress, try:  Exercising regularly.  Deep-breathing exercises.  Yoga.  Meditating.  Performing a body scan. This  involves closing your eyes, scanning your body from head to toe, and noticing which parts of your body are particularly tense. Purposefully relax the muscles in those areas.  Download or purchase mobile phone or tablet apps (applications) that can help you stick to your quit plan by providing reminders, tips, and encouragement. There are many free apps, such as QuitGuide from the [employer] developer for Disease Control and Prevention). You can find other support for quitting smoking (smoking cessation) through smokefree.gov and other websites. HOW WILL I FEEL WHEN I QUIT SMOKING? Within the first 24 hours of quitting smoking, you may start to feel some withdrawal symptoms. These symptoms are usually most noticeable 2-3 days after quitting, but they usually do not last beyond 2-3 weeks. Changes or symptoms that you might experience include:  Mood swings.  Restlessness, anxiety, or irritation.  Difficulty concentrating.  Dizziness.  Strong cravings for sugary foods in addition to nicotine.  Mild weight gain.  Constipation.  Nausea.  Coughing or a sore throat.  Changes in how your medicines work in your body.  A depressed mood.  Difficulty sleeping (insomnia). After the  first 2-3 weeks of quitting, you may start to notice more positive results, such as:  Improved sense of smell and taste.  Decreased coughing and sore throat.  Slower heart rate.  Lower blood pressure.  Clearer skin.  The ability to breathe more easily.  Fewer sick days. Quitting smoking is very challenging for most people. Do not get discouraged if you are not successful the first time. Some people need to make many attempts to quit before they achieve long-term success. Do your best to stick to your quit plan, and talk with your health care provider if you have any questions or concerns.   This information is not intended to replace advice given to you by your health care provider. Make sure you discuss any questions you have with your health care provider.   Document Released: 06/07/2001 Document Revised: 10/28/2014 Document Reviewed: 10/28/2014 Elsevier Interactive Patient Education [employer]

## 2015-07-02 NOTE — Assessment & Plan Note (Signed)
Currently working on smoking cessation with patches and gum. Discussed and educated regarding smoking cessation techniques and tips with information provided. Follow up as needed.

## 2015-07-02 NOTE — Assessment & Plan Note (Signed)
Stable with current dosage of clindamycin and topicort. Pain is managed with Tramadol. Denies adverse symptoms or diarrhea. Continue current dosage of clindamycin cream and oral and desoximetasone. Follow up if symptoms worsen or are no longer controlled with current regimen.

## 2015-07-02 NOTE — Progress Notes (Signed)
   Subjective:    Patient ID: Tara CrateShani J Ward, female    DOB: 1981/03/10, 35 y.o.   MRN: [id number]  Chief Complaint  Patient presents with  . Medication Refill    wants to talk about clindamycin and tapicort    HPI:  Tara CrateShani J Lamp is a 35 y.o. female who  has a past medical history of Depression; Peptic ulcer disease; Condyloma acuminata; Hidradenitis suppurativa; Pilonidal cyst; and Anxiety. and presents today for a follow up office visit.  1.) Smoking Cessation - Currently working on smoking cessation and using the patches and the gum. She continues to reduce the amounts she smokes. Reports that she is down to 2 cigarettes per day.  2.) Hidradentitis suppurativa - Currently maintained on clindamycin creams, oral tablets and desoximetasone. Reports that she takes the medication as prescribed and denies adverse reactions. Reports that her symptoms are well controlled with the current regimen.  Allergies  Allergen Reactions  . Doxycycline Swelling    SWELLING IN OPTIC NERVE.     Current Outpatient Prescriptions on File Prior to Visit  Medication Sig Dispense Refill  . clindamycin (CLEOCIN T) 1 % external solution APPLY TOPICALLY TWICE DAILY 30 mL 0  . traMADol (ULTRAM) 50 MG tablet Take 100 mg by mouth every 6 (six) hours as needed for moderate pain.    . traMADol (ULTRAM-ER) 300 MG 24 hr tablet Take 300 mg by mouth daily.     No current facility-administered medications on file prior to visit.    Review of Systems  Constitutional: Negative for fever and chills.  Gastrointestinal: Negative for nausea and diarrhea.  Skin: Negative for rash.      Objective:    BP 108/70 mmHg  Pulse 70  Temp(Src) 97.9 F (36.6 C) (Oral)  Resp 18  Ht 5\' 9"  (1.753 m)  Wt 242 lb (109.77 kg)  BMI 35.72 kg/m2  SpO2 98% Nursing note and vital signs reviewed.  Physical Exam  Constitutional: She is oriented to person, place, and time. She appears well-developed and well-nourished. No  distress.  Cardiovascular: Normal rate, regular rhythm, normal heart sounds and intact distal pulses.   Pulmonary/Chest: Effort normal and breath sounds normal.  Neurological: She is alert and oriented to person, place, and time.  Skin: Skin is warm and dry.  Psychiatric: She has a normal mood and affect. Her behavior is normal. Judgment and thought content normal.       Assessment & Plan:   Problem List Items Addressed This Visit      Musculoskeletal and Integument   HIDRADENITIS SUPPURATIVA - Primary    Stable with current dosage of clindamycin and topicort. Pain is managed with Tramadol. Denies adverse symptoms or diarrhea. Continue current dosage of clindamycin cream and oral and desoximetasone. Follow up if symptoms worsen or are no longer controlled with current regimen.       Relevant Medications   Desoximetasone (TOPICORT) 0.25 % ointment   clindamycin (CLEOCIN) 300 MG capsule     Other   TOBACCO USE    Currently working on smoking cessation with patches and gum. Discussed and educated regarding smoking cessation techniques and tips with information provided. Follow up as needed.

## 2015-07-02 NOTE — Progress Notes (Signed)
Pre visit review using our clinic review tool, if applicable. No additional management support is needed unless otherwise documented below in the visit note. 

## 2015-07-08 MED FILL — TRAMADOL ER 300 MG TABLET: 300 | 30 days supply | Qty: 30 | Fill #0

## 2015-07-20 MED FILL — LETROZOLE 2.5 MG TABLET: 2.5 | 30 days supply | Qty: 5 | Fill #1

## 2015-07-22 ENCOUNTER — Ambulatory Visit (INDEPENDENT_AMBULATORY_CARE_PROVIDER_SITE_OTHER): Payer: 59 | Admitting: Nurse Practitioner

## 2015-07-22 ENCOUNTER — Encounter: Payer: Self-pay | Admitting: Nurse Practitioner

## 2015-07-22 ENCOUNTER — Telehealth: Payer: Self-pay | Admitting: Family

## 2015-07-22 VITALS — BP 106/74 | HR 104 | Temp 98.7°F | Resp 16 | Ht 69.0 in | Wt 242.0 lb

## 2015-07-22 DIAGNOSIS — L732 Hidradenitis suppurativa: Secondary | ICD-10-CM | POA: Diagnosis not present

## 2015-07-22 MED ORDER — HYDROCODONE-ACETAMINOPHEN 5-325 MG PO TABS: 1.0000 | ORAL_TABLET | Freq: Four times a day (QID) | ORAL | Status: DC | PRN

## 2015-07-22 NOTE — Progress Notes (Signed)
Pre visit review using our clinic review tool, if applicable. No additional management support is needed unless otherwise documented below in the visit note. 

## 2015-07-22 NOTE — Telephone Encounter (Signed)
Likely would need to be seen as a flare up is usually due to infection and may need antibiotic versus surgical drainage for resolution of pain. She is getting tramadol and tramadol ER already for pain. Can add tylenol or ibuprofen for the inflammation.

## 2015-07-22 NOTE — Telephone Encounter (Signed)
Please advise if you can help pt with this situation.

## 2015-07-22 NOTE — Telephone Encounter (Signed)
Pt states Tammy Sours has been treating her for a sweat gland disorder and she has had a flare up and is wondering if he can call her in something for pain.  Pharmacy [facility] [address] Please advise

## 2015-07-22 NOTE — Telephone Encounter (Signed)
Set pt an appointment with Naomie Dean.

## 2015-07-22 NOTE — Progress Notes (Signed)
Patient ID: Tara Ward, female    DOB: 09-28-1980  Age: 35 y.o. MRN: [id number]  CC: Pain medication   HPI Tara Ward presents for CC of pain from hidradenitis Suppurativa.  1) Pt is currently on antibiotics. Tramadol usually helps her, but has not touched the pain. She has tried Vicodin 5-325 mg in the past with relief of pain.   History Tara Ward has a past medical history of Depression; Peptic ulcer disease; Condyloma acuminata; Hidradenitis suppurativa; Pilonidal cyst; and Anxiety.   She has past surgical history that includes Tubal ligation (2004).   Her family history includes Cancer in her other and paternal uncle; Diabetes in her father and other; Hyperthyroidism in her mother.She reports that she has been smoking Cigarettes.  She has a 15 pack-year smoking history. She has never used smokeless tobacco. She reports that she drinks alcohol. She reports that she does not use illicit drugs.  Outpatient Prescriptions Prior to Visit  Medication Sig Dispense Refill  . clindamycin (CLEOCIN T) 1 % external solution APPLY TOPICALLY TWICE DAILY 30 mL 0  . clindamycin (CLEOCIN) 300 MG capsule Take 1 capsule (300 mg total) by mouth 4 (four) times daily. 40 capsule 1  . Desoximetasone (TOPICORT) 0.25 % ointment APPLY TOPICALLY TWICE DAILY 15 g 3  . traMADol (ULTRAM) 50 MG tablet Take 100 mg by mouth every 6 (six) hours as needed for moderate pain.    . traMADol (ULTRAM-ER) 300 MG 24 hr tablet Take 300 mg by mouth daily.     No facility-administered medications prior to visit.    ROS Review of Systems  Constitutional: Negative for fever, chills, diaphoresis and fatigue.  Respiratory: Negative for chest tightness, shortness of breath and wheezing.   Gastrointestinal: Negative for nausea, vomiting and diarrhea.  Skin: Positive for color change.       Large skin abscess of neck    Objective:  BP 106/74 mmHg  Pulse 104  Temp(Src) 98.7 F (37.1 C) (Oral)  Resp 16  Ht  (1.753  m)  Wt 242 lb (109.77 kg)  BMI 35.72 kg/m2  SpO2 96%  Physical Exam  Constitutional: She appears well-developed and well-nourished. No distress.  Skin: She is not diaphoretic.     Large and draining at a few sites, looks like dried purulent discharge    Assessment & Plan:   [place] was seen today for pain medication.  Diagnoses and all orders for this visit:  HIDRADENITIS SUPPURATIVA  Other orders -     HYDROcodone-acetaminophen (NORCO) 5-325 MG tablet; Take 1 tablet by mouth every 6 (six) hours as needed for moderate pain.  I am having Ms. Blinder start on HYDROcodone-acetaminophen. I am also having her maintain her traMADol, traMADol, clindamycin, Desoximetasone, and clindamycin.  Meds ordered this encounter  Medications  . HYDROcodone-acetaminophen (NORCO) 5-325 MG tablet    Sig: Take 1 tablet by mouth every 6 (six) hours as needed for moderate pain.    Dispense:  28 tablet    Refill:  0    Order Specific Question:  Supervising Provider    Answer:  Sherlene Shams [[id number]]     Follow-up: Return if symptoms worsen or fail to improve.

## 2015-07-22 NOTE — Assessment & Plan Note (Signed)
Stable with clindamycin- taking abx. Tramadol is no longer managing the pain, however. Will give 1 week course of Norco. Advised not to take additional tylenol. Norco script printed, signed, given to pt. [place] database checked for compliance.  FU prn worsening/failure to improve.

## 2015-07-30 DIAGNOSIS — L732 Hidradenitis suppurativa: Secondary | ICD-10-CM | POA: Diagnosis not present

## 2015-07-30 DIAGNOSIS — Z79899 Other long term (current) drug therapy: Secondary | ICD-10-CM | POA: Diagnosis not present

## 2015-07-30 DIAGNOSIS — M542 Cervicalgia: Secondary | ICD-10-CM | POA: Diagnosis not present

## 2015-07-30 DIAGNOSIS — G894 Chronic pain syndrome: Secondary | ICD-10-CM | POA: Diagnosis not present

## 2015-08-12 DIAGNOSIS — H52223 Regular astigmatism, bilateral: Secondary | ICD-10-CM | POA: Diagnosis not present

## 2015-08-12 DIAGNOSIS — H5213 Myopia, bilateral: Secondary | ICD-10-CM | POA: Diagnosis not present

## 2015-08-20 ENCOUNTER — Other Ambulatory Visit: Payer: Self-pay | Admitting: Family

## 2015-09-24 DIAGNOSIS — N979 Female infertility, unspecified: Secondary | ICD-10-CM | POA: Diagnosis not present

## 2015-11-05 DIAGNOSIS — G894 Chronic pain syndrome: Secondary | ICD-10-CM | POA: Diagnosis not present

## 2015-11-05 DIAGNOSIS — L732 Hidradenitis suppurativa: Secondary | ICD-10-CM | POA: Diagnosis not present

## 2015-11-05 DIAGNOSIS — M542 Cervicalgia: Secondary | ICD-10-CM | POA: Diagnosis not present

## 2015-11-05 DIAGNOSIS — Z79891 Long term (current) use of opiate analgesic: Secondary | ICD-10-CM | POA: Diagnosis not present

## 2015-11-05 DIAGNOSIS — Z79899 Other long term (current) drug therapy: Secondary | ICD-10-CM | POA: Diagnosis not present

## 2015-11-13 DIAGNOSIS — Z3149 Encounter for other procreative investigation and testing: Secondary | ICD-10-CM | POA: Diagnosis not present

## 2015-11-25 ENCOUNTER — Encounter: Payer: Self-pay | Admitting: Family

## 2015-11-25 ENCOUNTER — Other Ambulatory Visit (INDEPENDENT_AMBULATORY_CARE_PROVIDER_SITE_OTHER): Payer: 59

## 2015-11-25 ENCOUNTER — Ambulatory Visit (INDEPENDENT_AMBULATORY_CARE_PROVIDER_SITE_OTHER): Payer: 59 | Admitting: Family

## 2015-11-25 VITALS — BP 108/70 | HR 78 | Temp 98.1°F | Resp 16 | Ht 69.0 in | Wt 265.0 lb

## 2015-11-25 DIAGNOSIS — Z0000 Encounter for general adult medical examination without abnormal findings: Secondary | ICD-10-CM | POA: Diagnosis not present

## 2015-11-25 LAB — CBC
HCT: 37.4 % (ref 36.0–46.0)
Hemoglobin: 12.3 g/dL (ref 12.0–15.0)
MCHC: 32.8 g/dL (ref 30.0–36.0)
MCV: 83.1 fl (ref 78.0–100.0)
Platelets: 326 10*3/uL (ref 150.0–400.0)
RBC: 4.5 Mil/uL (ref 3.87–5.11)
RDW: 13.9 % (ref 11.5–15.5)
WBC: 8.4 10*3/uL (ref 4.0–10.5)

## 2015-11-25 LAB — COMPREHENSIVE METABOLIC PANEL
ALT: 19 U/L (ref 0–35)
AST: 20 U/L (ref 0–37)
Albumin: 4.1 g/dL (ref 3.5–5.2)
Alkaline Phosphatase: 49 U/L (ref 39–117)
BUN: 8 mg/dL (ref 6–23)
CO2: 27 mEq/L (ref 19–32)
Calcium: 9.1 mg/dL (ref 8.4–10.5)
Chloride: 106 mEq/L (ref 96–112)
Creatinine, Ser: 0.57 mg/dL (ref 0.40–1.20)
GFR: 157.21 mL/min (ref >60.00)
Glucose, Bld: 93 mg/dL (ref 70–99)
Potassium: 4 mEq/L (ref 3.5–5.1)
Sodium: 138 mEq/L (ref 135–145)
Total Bilirubin: 0.4 mg/dL (ref 0.2–1.2)
Total Protein: 6.8 g/dL (ref 6.0–8.3)

## 2015-11-25 LAB — LIPID PANEL
Cholesterol: 141 mg/dL (ref 0–200)
HDL: 38.6 mg/dL — ABNORMAL LOW (ref >39.00)
LDL Cholesterol: 86 mg/dL (ref 0–99)
NonHDL: 102.37
Total CHOL/HDL Ratio: 4
Triglycerides: 81 mg/dL (ref 0.0–149.0)
VLDL: 16.2 mg/dL (ref 0.0–40.0)

## 2015-11-25 LAB — TSH: TSH: 1.1 u[IU]/mL (ref 0.35–4.50)

## 2015-11-25 NOTE — Progress Notes (Signed)
Subjective:    Patient ID: Tara Ward, female    DOB: [birth year], 35 y.o.   MRN: [id number]  Chief Complaint  Patient presents with  . CPE    fasting    HPI:  Tara Ward is a 35 y.o. female who presents today for an annual wellness visit.   1) Health Maintenance -   Diet - Averages about 3 meals per day consisting of fruits, vegetables, chicken; working on decreasing processed foods; Caffeine intake of about 1-2 cups daily  Exercise - 3x per week; cardio and stair climber   2) Preventative Exams / Immunizations:  Dental -- Due for exam  Vision -- Up to date   Health Maintenance  Topic Date Due  . HIV Screening  01/07/1998  . INFLUENZA VACCINE  01/26/2016  . PAP SMEAR  01/28/2018  . TETANUS/TDAP  09/14/2020    Immunization History  Administered Date(s) Administered  . Td 02/22/2010  . Tdap 09/15/2010   Allergies  Allergen Reactions  . Doxycycline Swelling    SWELLING IN OPTIC NERVE.     Outpatient Prescriptions Prior to Visit  Medication Sig Dispense Refill  . clindamycin (CLEOCIN T) 1 % external solution APPLY TOPICALLY TWICE DAILY 30 mL 0  . clindamycin (CLEOCIN) 300 MG capsule Take 1 capsule (300 mg total) by mouth 4 (four) times daily. 40 capsule 1  . Desoximetasone (TOPICORT) 0.25 % ointment APPLY TOPICALLY TWICE DAILY 15 g 3  . HYDROcodone-acetaminophen (NORCO) 5-325 MG tablet Take 1 tablet by mouth every 6 (six) hours as needed for moderate pain. 28 tablet 0  . traMADol (ULTRAM) 50 MG tablet Take 100 mg by mouth every 6 (six) hours as needed for moderate pain.    . traMADol (ULTRAM-ER) 300 MG 24 hr tablet Take 300 mg by mouth daily.     No facility-administered medications prior to visit.     Past Medical History  Diagnosis Date  . Depression   . Peptic ulcer disease   . Condyloma acuminata   . Hidradenitis suppurativa     recurrent  . Pilonidal cyst   . Anxiety      Past Surgical History  Procedure Laterality Date  . Tubal  ligation  2004     Family History  Problem Relation Age of Onset  . Cancer Other     lung  . Diabetes Other     1st degree relative  . Cancer Paternal Uncle     gastric  . Hyperthyroidism Mother   . Diabetes Father      Social History   Social History  . Marital Status: Married    Spouse Name: N/A  . Number of Children: 0  . Years of Education: 15   Occupational History  . CNA, Med tech    Social History Main Topics  . Smoking status: Former Smoker -- 1.00 packs/day for 15 years    Types: Cigarettes    Quit date: 07/12/2015  . Smokeless tobacco: Never Used  . Alcohol Use: 0.0 oz/week    0 Standard drinks or equivalent per week     Comment: socially   . Drug Use: No  . Sexual Activity: Not on file   Other Topics Concern  . Not on file   Social History Narrative   No regular excerise   Fun: Spend time with family.   Denies religious beliefs that would effect healthcare.     Review of Systems  Constitutional: Denies fever, chills, fatigue, or significant weight  gain/loss. HENT: Head: Denies headache or neck pain Ears: Denies changes in hearing, ringing in ears, earache, drainage Nose: Denies discharge, stuffiness, itching, nosebleed, sinus pain Throat: Denies sore throat, hoarseness, dry mouth, sores, thrush Eyes: Denies loss/changes in vision, pain, redness, blurry/double vision, flashing lights Cardiovascular: Denies chest pain/discomfort, tightness, palpitations, shortness of breath with activity, difficulty lying down, swelling, sudden awakening with shortness of breath Respiratory: Denies shortness of breath, cough, sputum production, wheezing Gastrointestinal: Denies dysphasia, heartburn, change in appetite, nausea, change in bowel habits, rectal bleeding, constipation, diarrhea, yellow skin or eyes Genitourinary: Denies frequency, urgency, burning/pain, blood in urine, incontinence, change in urinary strength. Musculoskeletal: Denies muscle/joint pain,  stiffness, back pain, redness or swelling of joints, trauma Skin: Denies rashes, lumps, itching, dryness, color changes, or hair/nail changes Neurological: Denies dizziness, fainting, seizures, weakness, numbness, tingling, tremor Psychiatric - Denies nervousness, stress, depression or memory loss Endocrine: Denies heat or cold intolerance, sweating, frequent urination, excessive thirst, changes in appetite Hematologic: Denies ease of bruising or bleeding     Objective:    BP 108/70 mmHg  Pulse 78  Temp(Src) 98.1 F (36.7 C) (Oral)  Resp 16  Ht 5\' 9"  (1.753 m)  Wt 265 lb (120.203 kg)  BMI 39.12 kg/m2  SpO2 99% Nursing note and vital signs reviewed.  Physical Exam  Constitutional: She is oriented to person, place, and time. She appears well-developed and well-nourished.  HENT:  Head: Normocephalic.  Right Ear: Hearing, tympanic membrane, external ear and ear canal normal.  Left Ear: Hearing, tympanic membrane, external ear and ear canal normal.  Nose: Nose normal.  Mouth/Throat: Uvula is midline, oropharynx is clear and moist and mucous membranes are normal.  Eyes: Conjunctivae and EOM are normal. Pupils are equal, round, and reactive to light.  Neck: Neck supple. No JVD present. No tracheal deviation present. No thyromegaly present.  Cardiovascular: Normal rate, regular rhythm, normal heart sounds and intact distal pulses.   Pulmonary/Chest: Effort normal and breath sounds normal.  Abdominal: Soft. Bowel sounds are normal. She exhibits no distension and no mass. There is no tenderness. There is no rebound and no guarding.  Musculoskeletal: Normal range of motion. She exhibits no edema or tenderness.  Lymphadenopathy:    She has no cervical adenopathy.  Neurological: She is alert and oriented to person, place, and time. She has normal reflexes. No cranial nerve deficit. She exhibits normal muscle tone. Coordination normal.  Skin: Skin is warm and dry.  Psychiatric: She has a  normal mood and affect. Her behavior is normal. Judgment and thought content normal.       Assessment & Plan:   Problem List Items Addressed This Visit      Other   Health care maintenance - Primary    1) Anticipatory Guidance: Discussed importance of wearing a seatbelt while driving and not texting while driving; changing batteries in smoke detector at least once annually; wearing suntan lotion when outside; eating a balanced and moderate diet; getting physical activity at least 30 minutes per day.  2) Immunizations / Screenings / Labs:  All immunizations are up-to-date per recommendations. Due for a dental exam encouraged to be completed independently. Obtain CBC, CMET, Lipid profile and TSH.   Overall well exam with risk factors for cardiovascular disease including obesity and former tobacco use. Encouraged weight loss approximate 5-10% of current body weight through nutrition and physical activity. Recommend increasing physical activity to 30 minutes of moderate level activity daily. Encourage nutritional intake that focuses on nutrient dense foods  and is moderate, varied, and balanced and is low in saturated fats and processed/sugary foods. She has quit smoking a proximally for an half months ago. Continue other healthy lifestyle behaviors and choices. She is working to conceive with gynecology. Follow-up prevention exam in 1 year. Follow-up office visit pending blood work as necessary.       Relevant Orders   Ambulatory referral to Dermatology   Comprehensive metabolic panel   CBC   Lipid panel   TSH       I am having Ms. Beaston maintain her traMADol, traMADol, Desoximetasone, clindamycin, HYDROcodone-acetaminophen, and clindamycin.   Follow-up: Return if symptoms worsen or fail to improve.   Jeanine Luz, FNP

## 2015-11-25 NOTE — Progress Notes (Signed)
Pre visit review using our clinic review tool, if applicable. No additional management support is needed unless otherwise documented below in the visit note. 

## 2015-11-25 NOTE — Patient Instructions (Signed)
Thank you for choosing [facility].  Summary/Instructions:   Please stop by the lab on the basement level of the building for your blood work. Your results will be released to MyChart (or called to you) after review, usually within 72 hours after test completion. If any changes need to be made, you will be notified at that same time.  Health Maintenance, Female Adopting a healthy lifestyle and getting preventive care can go a long way to promote health and wellness. Talk with your health care provider about what schedule of regular examinations is right for you. This is a good chance for you to check in with your provider about disease prevention and staying healthy. In between checkups, there are plenty of things you can do on your own. Experts have done a lot of research about which lifestyle changes and preventive measures are most likely to keep you healthy. Ask your health care provider for more information. WEIGHT AND DIET  Eat a healthy diet  Be sure to include plenty of vegetables, fruits, low-fat dairy products, and lean protein.  Do not eat a lot of foods high in solid fats, added sugars, or salt.  Get regular exercise. This is one of the most important things you can do for your health.  Most adults should exercise for at least 150 minutes each week. The exercise should increase your heart rate and make you sweat (moderate-intensity exercise).  Most adults should also do strengthening exercises at least twice a week. This is in addition to the moderate-intensity exercise.  Maintain a healthy weight  Body mass index (BMI) is a measurement that can be used to identify possible weight problems. It estimates body fat based on height and weight. Your health care provider can help determine your BMI and help you achieve or maintain a healthy weight.  For females 20 years of age and older:   A BMI below 18.5 is considered underweight.  A BMI of 18.5 to 24.9 is normal.  A  BMI of 25 to 29.9 is considered overweight.  A BMI of 30 and above is considered obese.  Watch levels of cholesterol and blood lipids  You should start having your blood tested for lipids and cholesterol at 33 years of age, then have this test every 5 years.  You may need to have your cholesterol levels checked more often if:  Your lipid or cholesterol levels are high.  You are older than 33 years of age.  You are at high risk for heart disease.  CANCER SCREENING   Lung Cancer  Lung cancer screening is recommended for adults 55-80 years old who are at high risk for lung cancer because of a history of smoking.  A yearly low-dose CT scan of the lungs is recommended for people who:  Currently smoke.  Have quit within the past 15 years.  Have at least a 30-pack-year history of smoking. A pack year is smoking an average of one pack of cigarettes a day for 1 year.  Yearly screening should continue until it has been 15 years since you quit.  Yearly screening should stop if you develop a health problem that would prevent you from having lung cancer treatment.  Breast Cancer  Practice breast self-awareness. This means understanding how your breasts normally appear and feel.  It also means doing regular breast self-exams. Let your health care provider know about any changes, no matter how small.  If you are in your 20s or 30s, you should have a   breast exam (CBE) by a health care provider every 1-3 years as part of a regular health exam.  If you are 47 or older, have a CBE every year. Also consider having a breast X-ray (mammogram) every year.  If you have a family history of breast cancer, talk to your health care provider about genetic screening.  If you are at high risk for breast cancer, talk to your health care provider about having an MRI and a mammogram every year.  Breast cancer gene (BRCA) assessment is recommended for women who have family members with  BRCA-related cancers. BRCA-related cancers include:  Breast.  Ovarian.  Tubal.  Peritoneal cancers.  Results of the assessment will determine the need for genetic counseling and BRCA1 and BRCA2 testing. Cervical Cancer Your health care provider may recommend that you be screened regularly for cancer of the pelvic organs (ovaries, uterus, and vagina). This screening involves a pelvic examination, including checking for microscopic changes to the surface of your cervix (Pap test). You may be encouraged to have this screening done every 3 years, beginning at age 26.  For women ages 95-65, health care providers may recommend pelvic exams and Pap testing every 3 years, or they may recommend the Pap and pelvic exam, combined with testing for human papilloma virus (HPV), every 5 years. Some types of HPV increase your risk of cervical cancer. Testing for HPV may also be done on women of any age with unclear Pap test results.  Other health care providers may not recommend any screening for nonpregnant women who are considered low risk for pelvic cancer and who do not have symptoms. Ask your health care provider if a screening pelvic exam is right for you.  If you have had past treatment for cervical cancer or a condition that could lead to cancer, you need Pap tests and screening for cancer for at least 20 years after your treatment. If Pap tests have been discontinued, your risk factors (such as having a new sexual partner) need to be reassessed to determine if screening should resume. Some women have medical problems that increase the chance of getting cervical cancer. In these cases, your health care provider may recommend more frequent screening and Pap tests. Colorectal Cancer  This type of cancer can be detected and often prevented.  Routine colorectal cancer screening usually begins at 33 years of age and continues through 34 years of age.  Your health care provider may recommend screening at  an earlier age if you have risk factors for colon cancer.  Your health care provider may also recommend using home test kits to check for hidden blood in the stool.  A small camera at the end of a tube can be used to examine your colon directly (sigmoidoscopy or colonoscopy). This is done to check for the earliest forms of colorectal cancer.  Routine screening usually begins at age 60.  Direct examination of the colon should be repeated every 5-10 years through 33 years of age. However, you may need to be screened more often if early forms of precancerous polyps or small growths are found. Skin Cancer  Check your skin from head to toe regularly.  Tell your health care provider about any new moles or changes in moles, especially if there is a change in a mole's shape or color.  Also tell your health care provider if you have a mole that is larger than the size of a pencil eraser.  Always use sunscreen. Apply sunscreen liberally and  repeatedly throughout the day.  Protect yourself by wearing long sleeves, pants, a wide-brimmed hat, and sunglasses whenever you are outside. HEART DISEASE, DIABETES, AND HIGH BLOOD PRESSURE   High blood pressure causes heart disease and increases the risk of stroke. High blood pressure is more likely to develop in:  People who have blood pressure in the high end of the normal range (130-139/85-89 mm Hg).  People who are overweight or obese.  People who are African American.  If you are 6-75 years of age, have your blood pressure checked every 3-5 years. If you are 78 years of age or older, have your blood pressure checked every year. You should have your blood pressure measured twice--once when you are at a hospital or clinic, and once when you are not at a hospital or clinic. Record the average of the two measurements. To check your blood pressure when you are not at a hospital or clinic, you can use:  An automated blood pressure machine at a  pharmacy.  A home blood pressure monitor.  If you are between 33 years and 77 years old, ask your health care provider if you should take aspirin to prevent strokes.  Have regular diabetes screenings. This involves taking a blood sample to check your fasting blood sugar level.  If you are at a normal weight and have a low risk for diabetes, have this test once every three years after 33 years of age.  If you are overweight and have a high risk for diabetes, consider being tested at a younger age or more often. PREVENTING INFECTION  Hepatitis B  If you have a higher risk for hepatitis B, you should be screened for this virus. You are considered at high risk for hepatitis B if:  You were born in a country where hepatitis B is common. Ask your health care provider which countries are considered high risk.  Your parents were born in a high-risk country, and you have not been immunized against hepatitis B (hepatitis B vaccine).  You have HIV or AIDS.  You use needles to inject street drugs.  You live with someone who has hepatitis B.  You have had sex with someone who has hepatitis B.  You get hemodialysis treatment.  You take certain medicines for conditions, including cancer, organ transplantation, and autoimmune conditions. Hepatitis C  Blood testing is recommended for:  Everyone born from [birth year] through 1963.  Anyone with known risk factors for hepatitis C. Sexually transmitted infections (STIs)  You should be screened for sexually transmitted infections (STIs) including gonorrhea and chlamydia if:  You are sexually active and are younger than 33 years of age.  You are older than 33 years of age and your health care provider tells you that you are at risk for this type of infection.  Your sexual activity has changed since you were last screened and you are at an increased risk for chlamydia or gonorrhea. Ask your health care provider if you are at risk.  If you do not  have HIV, but are at risk, it may be recommended that you take a prescription medicine daily to prevent HIV infection. This is called pre-exposure prophylaxis (PrEP). You are considered at risk if:  You are sexually active and do not regularly use condoms or know the HIV status of your partner(s).  You take drugs by injection.  You are sexually active with a partner who has HIV. Talk with your health care provider about whether you are at  at high risk of being infected with HIV. If you choose to begin PrEP, you should first be tested for HIV. You should then be tested every 3 months for as long as you are taking PrEP.  PREGNANCY   If you are premenopausal and you may become pregnant, ask your health care provider about preconception counseling.  If you may become pregnant, take 400 to 800 micrograms (mcg) of folic acid every day.  If you want to prevent pregnancy, talk to your health care provider about birth control (contraception). OSTEOPOROSIS AND MENOPAUSE   Osteoporosis is a disease in which the bones lose minerals and strength with aging. This can result in serious bone fractures. Your risk for osteoporosis can be identified using a bone density scan.  If you are 65 years of age or older, or if you are at risk for osteoporosis and fractures, ask your health care provider if you should be screened.  Ask your health care provider whether you should take a calcium or vitamin D supplement to lower your risk for osteoporosis.  Menopause may have certain physical symptoms and risks.  Hormone replacement therapy may reduce some of these symptoms and risks. Talk to your health care provider about whether hormone replacement therapy is right for you.  HOME CARE INSTRUCTIONS   Schedule regular health, dental, and eye exams.  Stay current with your immunizations.   Do not use any tobacco products including cigarettes, chewing tobacco, or electronic cigarettes.  If you are pregnant, do not  drink alcohol.  If you are breastfeeding, limit how much and how often you drink alcohol.  Limit alcohol intake to no more than 1 drink per day for nonpregnant women. One drink equals 12 ounces of beer, 5 ounces of wine, or 1 ounces of hard liquor.  Do not use street drugs.  Do not share needles.  Ask your health care provider for help if you need support or information about quitting drugs.  Tell your health care provider if you often feel depressed.  Tell your health care provider if you have ever been abused or do not feel safe at home.   This information is not intended to replace advice given to you by your health care provider. Make sure you discuss any questions you have with your health care provider.   Document Released: 12/27/2010 Document Revised: 07/04/2014 Document Reviewed: 05/15/2013 Elsevier Interactive Patient Education 2016 [employer]    

## 2015-11-25 NOTE — Assessment & Plan Note (Signed)
1) Anticipatory Guidance: Discussed importance of wearing a seatbelt while driving and not texting while driving; changing batteries in smoke detector at least once annually; wearing suntan lotion when outside; eating a balanced and moderate diet; getting physical activity at least 30 minutes per day.  2) Immunizations / Screenings / Labs:  All immunizations are up-to-date per recommendations. Due for a dental exam encouraged to be completed independently. Obtain CBC, CMET, Lipid profile and TSH.   Overall well exam with risk factors for cardiovascular disease including obesity and former tobacco use. Encouraged weight loss approximate 5-10% of current body weight through nutrition and physical activity. Recommend increasing physical activity to 30 minutes of moderate level activity daily. Encourage nutritional intake that focuses on nutrient dense foods and is moderate, varied, and balanced and is low in saturated fats and processed/sugary foods. She has quit smoking a proximally for an half months ago. Continue other healthy lifestyle behaviors and choices. She is working to conceive with gynecology. Follow-up prevention exam in 1 year. Follow-up office visit pending blood work as necessary.

## 2015-12-17 MED FILL — LETROZOLE 2.5 MG TABLET: 2.5 | 5 days supply | Qty: 10 | Fill #0

## 2015-12-31 ENCOUNTER — Other Ambulatory Visit: Payer: Self-pay | Admitting: Family

## 2016-01-01 ENCOUNTER — Emergency Department (HOSPITAL_COMMUNITY)
Admission: EM | Admit: 2016-01-01 | Discharge: 2016-01-01 | Disposition: A | Discharge status: Home / Self Care | Payer: 59 | Attending: Emergency Medicine | Admitting: Emergency Medicine

## 2016-01-01 ENCOUNTER — Encounter (HOSPITAL_COMMUNITY): Payer: Self-pay

## 2016-01-01 DIAGNOSIS — Z6839 Body mass index (BMI) 39.0-39.9, adult: Secondary | ICD-10-CM | POA: Insufficient documentation

## 2016-01-01 DIAGNOSIS — E663 Overweight: Secondary | ICD-10-CM | POA: Diagnosis not present

## 2016-01-01 DIAGNOSIS — R000 Tachycardia, unspecified: Secondary | ICD-10-CM | POA: Insufficient documentation

## 2016-01-01 DIAGNOSIS — L0501 Pilonidal cyst with abscess: Secondary | ICD-10-CM | POA: Diagnosis not present

## 2016-01-01 DIAGNOSIS — M79604 Pain in right leg: Secondary | ICD-10-CM | POA: Diagnosis not present

## 2016-01-01 DIAGNOSIS — Z87891 Personal history of nicotine dependence: Secondary | ICD-10-CM | POA: Insufficient documentation

## 2016-01-01 MED ORDER — OXYCODONE-ACETAMINOPHEN 5-325 MG PO TABS
1.0000 | ORAL_TABLET | Freq: Once | ORAL | Status: AC
  Administered 2016-01-01: 1 via ORAL
  Filled 2016-01-01: qty 1

## 2016-01-01 MED ORDER — OXYCODONE-ACETAMINOPHEN 5-325 MG PO TABS: 1.0000 | ORAL_TABLET | Freq: Once | ORAL | Status: DC

## 2016-01-01 MED ORDER — LIDOCAINE-EPINEPHRINE (PF) 2 %-1:200000 IJ SOLN
20.0000 mL | Freq: Once | INTRAMUSCULAR | Status: AC
  Administered 2016-01-01: 20 mL
  Filled 2016-01-01: qty 20

## 2016-01-01 NOTE — ED Provider Notes (Signed)
CSN: [id number]     Arrival date & time 01/01/16  0515 History   First MD Initiated Contact with Patient 01/01/16 [phone]     Chief Complaint  Patient presents with  . Abscess  . Leg Pain    right     (Consider location/radiation/quality/duration/timing/severity/associated sxs/prior Treatment) HPI  This is a 35 year old female with a history of depression, pectus ulcer disease, hidradenitis, pilonidal cyst who presents with recurrent pilonidal cyst. Patient reports that she has had several days of worsening pain and swelling over the area of her known pilonidal cyst. She has not noted any drainage. She has been using sitz baths with only minimal relief. She states that she has not been evaluated because she has had bad experiences having it lanced in the past. She also has been told that she may need surgery and does not feel she is ready for this yet. However, tonight she was at work when she had worsening pain. She also began to experience pain in her right buttock and right leg. She states the pain is shooting. Currently her pain is 9 out of 10. She hasn't taken anything for the pain. She denies any weakness, numbness, tingling in the lower extremities.  She denies fever. She denies difficulty with her bowel or bladder.  Past Medical History  Diagnosis Date  . Depression   . Peptic ulcer disease   . Condyloma acuminata   . Hidradenitis suppurativa     recurrent  . Pilonidal cyst   . Anxiety    Past Surgical History  Procedure Laterality Date  . Tubal ligation  2004   Family History  Problem Relation Age of Onset  . Cancer Other     lung  . Diabetes Other     1st degree relative  . Cancer Paternal Uncle     gastric  . Hyperthyroidism Mother   . Diabetes Father    Social History  Substance Use Topics  . Smoking status: Former Smoker -- 1.00 packs/day for 15 years    Types: Cigarettes    Quit date: 07/12/2015  . Smokeless tobacco: Never Used  . Alcohol Use: 0.0 oz/week   0 Standard drinks or equivalent per week     Comment: socially    OB History    No data available     Review of Systems  Constitutional: Negative for fever.  Genitourinary: Negative for difficulty urinating.  Musculoskeletal: Negative for back pain and neck pain.       Right buttock and right lower extremity pain  Skin:       Pilonidal abscess  Neurological: Negative for weakness and numbness.  All other systems reviewed and are negative.     Allergies  Doxycycline  Home Medications   Prior to Admission medications   Medication Sig Start Date End Date Taking? Authorizing Provider  clindamycin (CLEOCIN T) 1 % external solution APPLY TOPICALLY TWICE DAILY 08/20/15   Veryl SpeakGregory D Calone, FNP  clindamycin (CLEOCIN) 300 MG capsule Take 1 capsule (300 mg total) by mouth 4 (four) times daily. 07/02/15   Veryl SpeakGregory D Calone, FNP  Desoximetasone (TOPICORT) 0.25 % ointment APPLY TOPICALLY TWICE DAILY 07/02/15   Veryl SpeakGregory D Calone, FNP  HYDROcodone-acetaminophen (NORCO) 5-325 MG tablet Take 1 tablet by mouth every 6 (six) hours as needed for moderate pain. 07/22/15   Carollee Leitzarrie M Doss, NP  oxyCODONE-acetaminophen (PERCOCET/ROXICET) 5-325 MG tablet Take 1-2 tablets by mouth once. 01/01/16   Shon Batonourtney F Kylan Veach, MD  traMADol (ULTRAM) 50 MG tablet  Take 100 mg by mouth every 6 (six) hours as needed for moderate pain.    Historical Provider, MD  traMADol (ULTRAM-ER) 300 MG 24 hr tablet Take 300 mg by mouth daily.    Historical Provider, MD   BP 101/63 mmHg  Pulse 101  Temp(Src) 99.5 F (37.5 C) (Oral)  Ht 5\' 9"  (1.753 m)  Wt 265 lb (120.203 kg)  BMI 39.12 kg/m2  SpO2 98%  LMP 12/18/2015 (Exact Date) Physical Exam  Constitutional: She is oriented to person, place, and time. She appears well-developed and well-nourished.  Overweight  HENT:  Head: Normocephalic and atraumatic.  Cardiovascular: Regular rhythm and normal heart sounds.   Tachycardia  Pulmonary/Chest: Effort normal and breath sounds normal. No  respiratory distress. She has no wheezes.  Abdominal: Soft. Bowel sounds are normal. There is no tenderness. There is no rebound.  Musculoskeletal: Normal range of motion. She exhibits no edema.  Neg straight leg raise  Neurological: She is alert and oriented to person, place, and time.  No clonus, 5 out of 5 strength in bilateral lower extremities, normal reflexes, normal gait  Skin: Skin is warm.  Large area of fluctuance and induration noted just right of midline at the gluteal cleft, tenderness to palpation, no significant surrounding erythema  Psychiatric: She has a normal mood and affect.  Nursing note and vitals reviewed.   ED Course  Procedures (including critical care time)  INCISION AND DRAINAGE Performed by: Shon BatonHORTON, Chairty Toman F Consent: Verbal consent obtained. Risks and benefits: risks, benefits and alternatives were discussed Type: abscess  Body area: pilonidal/buttocks  Anesthesia: local infiltration  Incision was made with a scalpel.  Local anesthetic: lidocaine 1% w epinephrine  Anesthetic total: 8 ml  Complexity: complex Blunt dissection to break up loculations  Drainage: purulent  Drainage amount: copious  Packing material: 12 /in iodoform gauze  Patient tolerance: Patient tolerated the procedure well with no immediate complications.    Labs Review Labs Reviewed - No data to display  Imaging Review No results found. I have personally reviewed and evaluated these images and lab results as part of my medical decision-making.   EKG Interpretation None      MDM   Final diagnoses:  Pilonidal abscess  Pain of right lower extremity    Patient presents with recurrent pilonidal abscess and new right buttock and right lower extremity pain. She she is neurologically intact. No red flags back pain. The distribution of pain appears radicular and could be related to nerve impingement in the buttock region secondary to this abscess. Abscess was very  large and required loculation breakup and had copious drainage. Patient had immediate relief of her pain. This was packed. I discussed at length with the patient that she needs to follow-up with general surgery. She likely needs to have this pilonidal surgically removed to prevent recurrence. At this time it does not appear infected. No indication at this time for antibiotics. Patient will be discharged with pain medication. Follow-up with primary physician in 2 days for recheck and repacking.  After history, exam, and medical workup I feel the patient has been appropriately medically screened and is safe for discharge home. Pertinent diagnoses were discussed with the patient. Patient was given return precautions.   Shon Batonourtney F Minh Roanhorse, MD 01/01/16 [phone]

## 2016-01-01 NOTE — Discharge Instructions (Signed)
Pilonidal Cyst A pilonidal cyst is a fluid-filled sac. It forms beneath the skin near your tailbone, at the top of the crease of your buttocks. A pilonidal cyst that is not large or infected may not cause symptoms or problems. If the cyst becomes irritated or infected, it may fill with pus. This causes pain and swelling (pilonidal abscess). An infected cyst may need to be treated with medicine, drained, or removed. CAUSES The cause of a pilonidal cyst is not known. One cause may be a hair that grows into your skin (ingrown hair). RISK FACTORS Pilonidal cysts are more common in boys and men. Risk factors include:  Having lots of hair near the crease of the buttocks.  Being overweight.  Having a pilonidal dimple.  Wearing tight clothing.  Not bathing or showering frequently.  Sitting for long periods of time. SIGNS AND SYMPTOMS Signs and symptoms of a pilonidal cyst may include:  Redness.  Pain and tenderness.  Warmth.  Swelling.  Pus.  Fever. DIAGNOSIS Your health care provider may diagnose a pilonidal cyst based on your symptoms and a physical exam. The health care provider may do a blood test to check for infection. If your cyst is draining pus, your health care provider may take a sample of the drainage to be tested at a laboratory. TREATMENT Surgery is the usual treatment for an infected pilonidal cyst. You may also have to take medicines before surgery. The type of surgery you have depends on the size and severity of the infected cyst. The different kinds of surgery include:  Incision and drainage. This is a procedure to open and drain the cyst.  Marsupialization. In this procedure, a large cyst or abscess may be opened and kept open by stitching the edges of the skin to the cyst walls.  Cyst removal. This procedure involves opening the skin and removing all or part of the cyst. HOME CARE INSTRUCTIONS  Follow all of your surgeon's instructions carefully if you had  surgery.  Take medicines only as directed by your health care provider.  If you were prescribed an antibiotic medicine, finish it all even if you start to feel better.  Keep the area around your pilonidal cyst clean and dry.  Clean the area as directed by your health care provider. Pat the area dry with a clean towel. Do not rub it as this may cause bleeding.  Remove hair from the area around the cyst as directed by your health care provider.  Do not wear tight clothing or sit in one place for long periods of time.  There are many different ways to close and cover an incision, including stitches, skin glue, and adhesive strips. Follow your health care provider's instructions on:  Incision care.  Bandage (dressing) changes and removal.  Incision closure removal. SEEK MEDICAL CARE IF:   You have drainage, redness, swelling, or pain at the site of the cyst.  You have a fever.   This information is not intended to replace advice given to you by your health care provider. Make sure you discuss any questions you have with your health care provider.   Document Released: 06/10/2000 Document Revised: 07/04/2014 Document Reviewed: 10/31/2013 Elsevier Interactive Patient Education 2016 Elsevier Inc. Incision and Drainage of a Pilonidal Cyst Incision and drainage is a surgical procedure to open and drain a fluid-filled sac that forms around a hair follicle in the tailbone area between your buttocks (pilonidal cyst). You may need this procedure if the cyst becomes painful,  painful, swollen, or infected. °There are three types of procedures that may be done. The type of procedure you have depends on the size and severity of your infected cyst. The procedure may be: °· Incision and drainage with a special type of bandage (wound packing). Packing is used for wounds that are deep or tunnel under the skin. °· Marsupialization. In this procedure, the cyst will be opened and kept open. The edges of the  incision will be stitched together to make a pocket. °· Incision and drainage without wound packing. °LET YOUR HEALTH CARE PROVIDER KNOW ABOUT: °· Any allergies you have. °· All medicines you are taking, including vitamins, herbs, eye drops, creams, and over-the-counter medicines. °· Previous problems you or members of your family have had with the use of anesthetics. °· Any blood disorders you have. °· Previous surgeries you have had. °· Medical conditions you have. °RISKS AND COMPLICATIONS °Generally, this is a safe procedure. However, problems can occur and include: °· Infection. °· Bleeding. °· Having another cyst develop. °· Need for more surgery. °BEFORE THE PROCEDURE °· Ask your health care provider about: °¨ Changing or stopping your regular medicines. This is especially important if you are taking diabetes medicines or blood thinners. °¨ Taking medicines such as aspirin and ibuprofen. These medicines can thin your blood. Do not take these medicines before your procedure if your health care provider tells you not to. °¨ Taking antibiotics before surgery to control the infection. °· Do not eat or drink anything for 6-8 hours before the procedure if you are having general anesthesia. °· Take a shower the night before the procedure to clean your buttocks area. Take another shower in the morning before surgery. °· Plan to have someone take you home after the procedure. °PROCEDURE  °· You will have an IV tube inserted in a vein in your hand or arm. °· You will be given one of the following: °¨ A medicine that numbs the area (local anesthetic). °¨ A medicine that makes you go to sleep (general anesthetic). °· You also may be given medicine to help you relax during the procedure (sedative). °· You will lie face down on the operating table. °· Your buttocks area may be shaved. °· Tape may be used to spread your buttocks. °· Germ-killing solution (antiseptic) may be used to clean the area. °Incision and Drainage With  Wound Packing:  °· Your surgeon will make a surgical cut (incision) over the cyst to open it. °· A probe may be used to see if there are tunnels extending away from the cyst under your skin. °· Fluid or pus inside the cyst will be drained. °· The cyst will be flushed out with a germ-free (sterile) solution. °· Packing will be placed into the open cyst. This keeps it open and draining after surgery. °· The area will be covered with a bandage (dressing). °Marsupialization: °· Your surgeon will make a surgical cut (incision) over the cyst to open it. °· A probe may be used to see if there are tunnels extending away from the cyst under your skin. °· Fluid or pus inside the cyst will be drained. °· The cyst will be flushed out with a germ-free (sterile) solution. °· The edges of the incision will be stitched (sutured) to the skin to keep it wide open. The cyst will not be packed. °· A rolled-up bandage (dressing) will be taped over the incision. °Incision and Drainage Without Packing:  °· Your surgeon will make a surgical cut (  over the cyst to open it.  A probe may be used to see if there are tunnels extending away from the cyst under your skin.  Fluid or pus inside the cyst will be drained.  The cyst will be flushed out with a germ-free (sterile) solution.  Your surgeon may also remove the tissue around the opened cyst.  The incision then will be closed with stitches (sutures). It will not be left open, and packing will not be used.  A bandage (dressing) will be put over the incision area. AFTER THE PROCEDURE  If you had general anesthesia, you will be taken to a recovery area. Your blood pressure, heart rate, breathing rate, and blood oxygen level will be monitored often until the medicines you were given have worn off.  It is normal to have some pain after this procedure. You may be given pain medicine.  Your IV tube can be taken out after you have recovered and your pain is under control.     This information is not intended to replace advice given to you by your health care provider. Make sure you discuss any questions you have with your health care provider.   Document Released: 01/08/2014 Document Reviewed: 01/08/2014 Elsevier Interactive Patient Education [employer]2016 Elsevier Inc[employer]

## 2016-01-01 NOTE — ED Notes (Signed)
Pt complaining of an abscess on the lower back as well as right leg pain that started tonight at work.

## 2016-01-04 ENCOUNTER — Emergency Department (HOSPITAL_COMMUNITY): Payer: 59

## 2016-01-04 ENCOUNTER — Encounter (HOSPITAL_COMMUNITY): Payer: Self-pay | Admitting: Emergency Medicine

## 2016-01-04 ENCOUNTER — Emergency Department (HOSPITAL_COMMUNITY)
Admission: EM | Admit: 2016-01-04 | Discharge: 2016-01-04 | Disposition: A | Discharge status: Home / Self Care | Payer: 59 | Attending: Emergency Medicine | Admitting: Emergency Medicine

## 2016-01-04 DIAGNOSIS — Z79899 Other long term (current) drug therapy: Secondary | ICD-10-CM | POA: Insufficient documentation

## 2016-01-04 DIAGNOSIS — J029 Acute pharyngitis, unspecified: Secondary | ICD-10-CM | POA: Diagnosis present

## 2016-01-04 DIAGNOSIS — Z87891 Personal history of nicotine dependence: Secondary | ICD-10-CM | POA: Diagnosis not present

## 2016-01-04 DIAGNOSIS — J069 Acute upper respiratory infection, unspecified: Secondary | ICD-10-CM | POA: Insufficient documentation

## 2016-01-04 DIAGNOSIS — R0602 Shortness of breath: Secondary | ICD-10-CM | POA: Diagnosis not present

## 2016-01-04 DIAGNOSIS — Z4800 Encounter for change or removal of nonsurgical wound dressing: Secondary | ICD-10-CM | POA: Insufficient documentation

## 2016-01-04 DIAGNOSIS — F329 Major depressive disorder, single episode, unspecified: Secondary | ICD-10-CM | POA: Insufficient documentation

## 2016-01-04 DIAGNOSIS — R05 Cough: Secondary | ICD-10-CM | POA: Diagnosis not present

## 2016-01-04 DIAGNOSIS — B9789 Other viral agents as the cause of diseases classified elsewhere: Secondary | ICD-10-CM

## 2016-01-04 DIAGNOSIS — Z5189 Encounter for other specified aftercare: Secondary | ICD-10-CM

## 2016-01-04 DIAGNOSIS — Z792 Long term (current) use of antibiotics: Secondary | ICD-10-CM | POA: Insufficient documentation

## 2016-01-04 DIAGNOSIS — Z09 Encounter for follow-up examination after completed treatment for conditions other than malignant neoplasm: Secondary | ICD-10-CM | POA: Diagnosis not present

## 2016-01-04 LAB — RAPID STREP SCREEN (MED CTR MEBANE ONLY): Streptococcus, Group A Screen (Direct): NEGATIVE

## 2016-01-04 MED ORDER — FLUTICASONE PROPIONATE 50 MCG/ACT NA SUSP: 2.0000 | Freq: Every day | NASAL | Status: DC

## 2016-01-04 MED ORDER — GUAIFENESIN-CODEINE 100-10 MG/5ML PO SOLN: 5.0000 mL | Freq: Three times a day (TID) | ORAL | Status: DC | PRN

## 2016-01-04 NOTE — ED Notes (Signed)
Bed: WA08 Expected date:  Expected time:  Means of arrival:  Comments: 

## 2016-01-04 NOTE — ED Notes (Addendum)
Pt treated for abscess [facility] buttocks at [facility] on Friday. Area was lanced and packed and pt told to return to PCP today for packing removal. Pt's PCP was unavailable today. Pt also c/o "scratchy throat" and head congestion with productive cough since Friday as well. A&Ox4 and ambulatory. Pt c/o chest pain when coughing or deep breathing.

## 2016-01-04 NOTE — Discharge Instructions (Signed)
Please continue wound care measures, please follow-up with her primary care provider on Friday for reevaluation and further management. Please return immediately if new or worsening signs or symptoms present.

## 2016-01-04 NOTE — ED Provider Notes (Signed)
CSN: [id number]     Arrival date & time 01/04/16  [id number] History   First MD Initiated Contact with Patient 01/04/16 1031     Chief Complaint  Patient presents with  . Packing Removal    . Sore Throat  . Nasal Congestion    HPI   35 year old female presents today for wound check and upper respiratory complaints. Patient reports 4 days ago patient was seen for pilonidal cyst which required drainage. Patient reports good wound care since then, and is here for packing removal.  Patient also notes that the same day she developed upper respiratory complaints including right-sided ear pain, sinus congestion, cough. Patient denies any shortness of breath, chest pain, abdominal pain, nausea vomiting or diaphoresis. Patient reports she has had intermittent subjective fever. Tolerating by mouth without difficulty.  Past Medical History  Diagnosis Date  . Depression   . Peptic ulcer disease   . Condyloma acuminata   . Hidradenitis suppurativa     recurrent  . Pilonidal cyst   . Anxiety    Past Surgical History  Procedure Laterality Date  . Tubal ligation  2004   Family History  Problem Relation Age of Onset  . Cancer Other     lung  . Diabetes Other     1st degree relative  . Cancer Paternal Uncle     gastric  . Hyperthyroidism Mother   . Diabetes Father    Social History  Substance Use Topics  . Smoking status: Former Smoker -- 1.00 packs/day for 15 years    Types: Cigarettes    Quit date: 07/12/2015  . Smokeless tobacco: Never Used  . Alcohol Use: 0.0 oz/week    0 Standard drinks or equivalent per week     Comment: socially    OB History    No data available     Review of Systems  All other systems reviewed and are negative.  Allergies  Doxycycline  Home Medications   Prior to Admission medications   Medication Sig Start Date End Date Taking? Authorizing Provider  clindamycin (CLEOCIN T) 1 % external solution APPLY TOPICALLY TWICE DAILY 01/01/16   Veryl SpeakGregory D Calone,  FNP  clindamycin (CLEOCIN) 300 MG capsule TAKE 1 CAPSULE(300 MG) BY MOUTH FOUR TIMES DAILY 01/01/16   Veryl SpeakGregory D Calone, FNP  Desoximetasone (TOPICORT) 0.25 % ointment APPLY TOPICALLY TWICE DAILY 07/02/15   Veryl SpeakGregory D Calone, FNP  fluticasone (FLONASE) 50 MCG/ACT nasal spray Place 2 sprays into both nostrils daily. 01/04/16   Napoleon Monacelli, PA-C  guaiFENesin-codeine 100-10 MG/5ML syrup Take 5 mLs by mouth 3 (three) times daily as needed for cough. 01/04/16   Eyvonne MechanicJeffrey Nicklos Gaxiola, PA-C  HYDROcodone-acetaminophen (NORCO) 5-325 MG tablet Take 1 tablet by mouth every 6 (six) hours as needed for moderate pain. 07/22/15   Carollee Leitzarrie M Doss, NP  oxyCODONE-acetaminophen (PERCOCET/ROXICET) 5-325 MG tablet Take 1-2 tablets by mouth once. 01/01/16   Shon Batonourtney F Horton, MD  traMADol (ULTRAM) 50 MG tablet Take 100 mg by mouth every 6 (six) hours as needed for moderate pain.    Historical Provider, MD  traMADol (ULTRAM-ER) 300 MG 24 hr tablet Take 300 mg by mouth daily.    Historical Provider, MD   BP 110/73 mmHg  Pulse 90  Temp(Src) 98.9 F (37.2 C) (Oral)  Resp 16  Ht 5\' 9"  (1.753 m)  Wt 120.657 kg  BMI 39.26 kg/m2  SpO2 100%  LMP 12/18/2015 (Exact Date)   Physical Exam  Constitutional: She is oriented to person, place,  and time. She appears well-developed and well-nourished.  HENT:  Head: Normocephalic and atraumatic.  Right Ear: Hearing and tympanic membrane normal.  Left Ear: Hearing and tympanic membrane normal.  Mouth/Throat: Uvula is midline, oropharynx is clear and moist and mucous membranes are normal. No oropharyngeal exudate, posterior oropharyngeal edema, posterior oropharyngeal erythema or tonsillar abscesses.  Eyes: Conjunctivae are normal. Pupils are equal, round, and reactive to light. Right eye exhibits no discharge. Left eye exhibits no discharge. No scleral icterus.  Neck: Normal range of motion. No JVD present. No tracheal deviation present.  Cardiovascular: Regular rhythm, normal heart sounds and  intact distal pulses.  Exam reveals no gallop and no friction rub.   No murmur heard. Pulmonary/Chest: Effort normal and breath sounds normal. No stridor. No respiratory distress. She has no wheezes. She has no rales. She exhibits no tenderness.  Neurological: She is alert and oriented to person, place, and time. Coordination normal.  Skin:  Post I&D of pilonidal cyst over the sacrum, no signs of surrounding infection, packing with minimal discharge  Psychiatric: She has a normal mood and affect. Her behavior is normal. Judgment and thought content normal.  Nursing note and vitals reviewed.   ED Course  Procedures (including critical care time) Labs Review Labs Reviewed  RAPID STREP SCREEN (NOT AT [facility])  CULTURE, GROUP A STREP [facility])    Imaging Review Dg Chest 2 View  01/04/2016  CLINICAL DATA:  Shortness of breath and fever for 3 days EXAM: CHEST  2 VIEW COMPARISON:  None. FINDINGS: There is minimal bibasilar atelectatic change. There is no edema or consolidation. Heart size and pulmonary vascularity are normal. No adenopathy. No bone lesions. IMPRESSION: Minimal bibasilar atelectasis.  No edema or consolidation. Electronically Signed   By: Bretta Bang III M.D.   On: 01/04/2016 12:16   I have personally reviewed and evaluated these images and lab results as part of my medical decision-making.   EKG Interpretation None      MDM   Final diagnoses:  Viral URI with cough  Wound check, abscess    Labs:Rapid strep- negative  Imaging: DG chest 2 view  Consults:  Therapeutics:  Discharge Meds:   Assessment/Plan:  35 year old female presents today for wound check, upper respiratory complaints. Patient wound appears to be healing appropriately, this was a deeper wound, and required packing, I again packed it today and instructed to the patient remained packing in 2 days, follow up with primary care provider in 3 days. No signs of surrounding cellulitis, wound care  instructions given. Patient's upper respiratory complaints most likely viral in nature, symptomatic care instructions given. Patient is given strict return precautions, she verbalized understanding and agreement to today's plan had no further questions or concerns at the time discharge       Eyvonne Mechanic, PA-C 01/05/16 1522  Leta Baptist, MD 01/18/16 [phone]

## 2016-01-07 LAB — CULTURE, GROUP A STREP (THRC)

## 2016-01-08 ENCOUNTER — Encounter: Payer: Self-pay | Admitting: Family

## 2016-01-08 ENCOUNTER — Ambulatory Visit (INDEPENDENT_AMBULATORY_CARE_PROVIDER_SITE_OTHER): Payer: 59 | Admitting: Family

## 2016-01-08 VITALS — BP 100/74 | HR 82 | Temp 98.0°F | Resp 16 | Ht 69.0 in | Wt 262.0 lb

## 2016-01-08 DIAGNOSIS — L0501 Pilonidal cyst with abscess: Secondary | ICD-10-CM | POA: Diagnosis not present

## 2016-01-08 DIAGNOSIS — J069 Acute upper respiratory infection, unspecified: Secondary | ICD-10-CM | POA: Diagnosis not present

## 2016-01-08 NOTE — Patient Instructions (Signed)
Thank you for choosing [facility].  Summary/Instructions:  Please continue to take the over the counter medications as prescribed. This appears like a viral infection. If your symptoms do not continue to improve please let us know.   Keep the site clean with soap and water. May use gauze to help absorb drainage.   Consider surgical referral if symptoms do not improve or return.   If your symptoms worsen or fail to improve, please contact our office for further instruction, or in case of emergency go directly to the emergency room at the closest medical facility.   General Recommendations:    Please drink plenty of fluids.  Get plenty of rest   Sleep in humidified air  Use saline nasal sprays  Netti pot   OTC Medications:  Decongestants - helps relieve congestion   Flonase (generic fluticasone) or Nasacort (generic triamcinolone) - please make sure to use the "cross-over" technique at a 45 degree angle towards the opposite eye as opposed to straight up the nasal passageway.   Sudafed (generic pseudoephedrine - Note this is the one that is available behind the pharmacy counter); Products with phenylephrine (-PE) may also be used but is often not as effective as pseudoephedrine.   If you have HIGH BLOOD PRESSURE - Coricidin HBP; AVOID any product that is -D as this contains pseudoephedrine which may increase your blood pressure.  Afrin (oxymetazoline) every 6-8 hours for up to 3 days.   Allergies - helps relieve runny nose, itchy eyes and sneezing   Claritin (generic loratidine), Allegra (fexofenidine), or Zyrtec (generic cyrterizine) for runny nose. These medications should not cause drowsiness.  Note - Benadryl (generic diphenhydramine) may be used however may cause drowsiness  Cough -   Delsym or Robitussin (generic dextromethorphan)  Expectorants - helps loosen mucus to ease removal   Mucinex (generic guaifenesin) as directed on the package.  Headaches  / General Aches   Tylenol (generic acetaminophen) - DO NOT EXCEED 3 grams (3,000 mg) in a 24 hour time period  Advil/Motrin (generic ibuprofen)   Sore Throat -   Salt water gargle   Chloraseptic (generic benzocaine) spray or lozenges / Sucrets (generic dyclonine)

## 2016-01-08 NOTE — Assessment & Plan Note (Addendum)
Pilonidal cyst appears well healing with no further evidence of infection or need for packing. Continue to keep clean and dry with basic wound care. Concern for possible return and may need surgical referral to have it removed. Follow up if symptoms worsen or do not improve.

## 2016-01-08 NOTE — Progress Notes (Signed)
Subjective:    Patient ID: Tara Ward, female    DOB: 1981-06-05, 35 y.o.   MRN: [id number]  Chief Complaint  Patient presents with  . Hospitalization Follow-up    HPI:  Tara Ward is a 35 y.o. female who  has a past medical history of Depression; Peptic ulcer disease; Condyloma acuminata; Hidradenitis suppurativa; Pilonidal cyst; and Anxiety. and presents today for an office visit.   Recently evaluated in the ED for swelling and worsening over an area of a known pilonidal cyst. Modifying factors include Sitz baths with minimal relief. She is also experiencing pain in her right buttock and down her right leg rated a 9/10. The site was incised, drained, and packed with Instructions to follow-up in 2 days. She returned to the emergency department with improved symptoms and have the packing removed and replaced. She was instructed to remove the packing and continue sitz bath. Upon her second visit she was also noted to have an upper respiratory infection and started on over-the-counter medications for symptom relief and supportive care. She was instructed to follow-up with primary care in 2-3 days.All ED records and labs were reviewed in detail.  Since leaving the emergency department she continues to have some drainage from the site with no fevers or other signs of infection. The packing was removed approximately 2 days ago and continues to have mild drainage on occasion. Continues to use sitz bath and is currently uncovered. Her respiratory infection has seen some improvements with the modifying factors of Flonase, cough syrup, and Nettie pot. Denies fevers. Course of the symptoms has improved since initial onset with some sinus tenderness remaining. She also endorses a moderate amount of congestion and a productive cough. She does say clindamycin for her Hidradentis suppurativa.     Allergies  Allergen Reactions  . Doxycycline Swelling    SWELLING IN OPTIC NERVE.     Current  Outpatient Prescriptions on File Prior to Visit  Medication Sig Dispense Refill  . clindamycin (CLEOCIN T) 1 % external solution APPLY TOPICALLY TWICE DAILY 30 mL 0  . clindamycin (CLEOCIN) 300 MG capsule TAKE 1 CAPSULE(300 MG) BY MOUTH FOUR TIMES DAILY 40 capsule 0  . Desoximetasone (TOPICORT) 0.25 % ointment APPLY TOPICALLY TWICE DAILY 15 g 3  . fluticasone (FLONASE) 50 MCG/ACT nasal spray Place 2 sprays into both nostrils daily. 9.9 g 2  . guaiFENesin-codeine 100-10 MG/5ML syrup Take 5 mLs by mouth 3 (three) times daily as needed for cough. 120 mL 0  . HYDROcodone-acetaminophen (NORCO) 5-325 MG tablet Take 1 tablet by mouth every 6 (six) hours as needed for moderate pain. 28 tablet 0  . oxyCODONE-acetaminophen (PERCOCET/ROXICET) 5-325 MG tablet Take 1-2 tablets by mouth once. 15 tablet 0  . traMADol (ULTRAM) 50 MG tablet Take 100 mg by mouth every 6 (six) hours as needed for moderate pain.    . traMADol (ULTRAM-ER) 300 MG 24 hr tablet Take 300 mg by mouth daily.     No current facility-administered medications on file prior to visit.     Past Surgical History  Procedure Laterality Date  . Tubal ligation  2004    Past Medical History  Diagnosis Date  . Depression   . Peptic ulcer disease   . Condyloma acuminata   . Hidradenitis suppurativa     recurrent  . Pilonidal cyst   . Anxiety       Review of Systems  Constitutional: Negative for fever and chills.  HENT: Positive for congestion  and sinus pressure.   Respiratory: Positive for cough. Negative for chest tightness and shortness of breath.   Skin: Positive for wound.  Neurological: Negative for dizziness, weakness and headaches.      Objective:    BP 100/74 mmHg  Pulse 82  Temp(Src) 98 F (36.7 C) (Oral)  Resp 16  Ht 5\' 9"  (1.753 m)  Wt 262 lb (118.842 kg)  BMI 38.67 kg/m2  SpO2 98%  LMP 12/18/2015 (Exact Date) Nursing note and vital signs reviewed.  Physical Exam  Constitutional: She is oriented to person,  place, and time. She appears well-developed and well-nourished. No distress.  HENT:  Right Ear: Hearing, tympanic membrane, external ear and ear canal normal.  Left Ear: Hearing, tympanic membrane, external ear and ear canal normal.  Nose: Right sinus exhibits maxillary sinus tenderness. Right sinus exhibits no frontal sinus tenderness. Left sinus exhibits maxillary sinus tenderness. Left sinus exhibits no frontal sinus tenderness.  Mouth/Throat: Uvula is midline, oropharynx is clear and moist and mucous membranes are normal.  Cardiovascular: Normal rate, regular rhythm, normal heart sounds and intact distal pulses.   Pulmonary/Chest: Effort normal and breath sounds normal. She has no wheezes. She has no rales.  Neurological: She is alert and oriented to person, place, and time.  Skin: Skin is warm and dry.  Cyst located on the right gluteal cleft appears with scant serious discharge with no evidence of infection. There is mild redness and tenderness.   Psychiatric: She has a normal mood and affect. Her behavior is normal. Judgment and thought content normal.       Assessment & Plan:   Problem List Items Addressed This Visit      Respiratory   Acute upper respiratory infection    This is a new problem. Symptoms and exam consistent with acute upper respiratory infection most likely viral given subtle improvements. Recommend continue supportive care with over-the-counter medications as needed for symptom relief. If symptoms worsen or do not continue to improve consider antibiotic therapy. Follow-up as needed.        Musculoskeletal and Integument   Pilonidal cyst with abscess - Primary    Pilonidal cyst appears well healing with no further evidence of infection or need for packing. Continue to keep clean and dry with basic wound care. Concern for possible return and may need surgical referral to have it removed. Follow up if symptoms worsen or do not improve.         I am having [name] maintain her traMADol, traMADol, Desoximetasone, HYDROcodone-acetaminophen, clindamycin, clindamycin, oxyCODONE-acetaminophen, guaiFENesin-codeine, and fluticasone.   Follow-up: Return in about 3 months (around 04/09/2016), or if symptoms worsen or fail to improve.  Jeanine Luzalone, Verlia Kaney, FNP

## 2016-01-08 NOTE — Assessment & Plan Note (Addendum)
This is a new problem. Symptoms and exam consistent with acute upper respiratory infection most likely viral given subtle improvements. Recommend continue supportive care with over-the-counter medications as needed for symptom relief. If symptoms worsen or do not continue to improve consider antibiotic therapy. Follow-up as needed.

## 2016-01-13 ENCOUNTER — Encounter: Payer: Self-pay | Admitting: Family

## 2016-01-18 MED FILL — LETROZOLE 2.5 MG TABLET: 2.5 | 28 days supply | Qty: 15 | Fill #0

## 2016-02-04 DIAGNOSIS — G894 Chronic pain syndrome: Secondary | ICD-10-CM | POA: Diagnosis not present

## 2016-02-04 DIAGNOSIS — M542 Cervicalgia: Secondary | ICD-10-CM | POA: Diagnosis not present

## 2016-02-04 DIAGNOSIS — Z79891 Long term (current) use of opiate analgesic: Secondary | ICD-10-CM | POA: Diagnosis not present

## 2016-02-04 DIAGNOSIS — Z79899 Other long term (current) drug therapy: Secondary | ICD-10-CM | POA: Diagnosis not present

## 2016-02-04 MED FILL — traMADol HCL 50 MG TABS: 50 | 30 days supply | Qty: 60 | Fill #0

## 2016-02-04 MED FILL — traMADol HCL ER 300 MG TB24: 300 | 30 days supply | Qty: 30 | Fill #0

## 2016-02-12 MED FILL — LETROZOLE 2.5 MG TABLET: 2.5 | 5 days supply | Qty: 15 | Fill #0

## 2016-03-03 MED FILL — traMADol HCL 50 MG TABS: 50 | 30 days supply | Qty: 60 | Fill #1

## 2016-03-03 MED FILL — traMADol HCL ER 300 MG TB24: 300 | 30 days supply | Qty: 30 | Fill #1

## 2016-04-04 MED FILL — traMADol HCL 50 MG TABS: 50 | 30 days supply | Qty: 60 | Fill #2

## 2016-04-04 MED FILL — traMADol HCL ER 300 MG TB24: 300 | 30 days supply | Qty: 30 | Fill #2

## 2016-04-06 ENCOUNTER — Ambulatory Visit: Payer: 59 | Admitting: Family

## 2016-04-07 ENCOUNTER — Telehealth: Payer: Self-pay | Admitting: Family

## 2016-04-07 NOTE — Telephone Encounter (Signed)
Ok to reschedule if she calls back.  

## 2016-04-07 NOTE — Telephone Encounter (Signed)
Patient no showed for acute visit on 10/11.  Please advise.

## 2016-05-06 DIAGNOSIS — Z79899 Other long term (current) drug therapy: Secondary | ICD-10-CM | POA: Diagnosis not present

## 2016-05-06 DIAGNOSIS — M542 Cervicalgia: Secondary | ICD-10-CM | POA: Diagnosis not present

## 2016-05-06 DIAGNOSIS — G894 Chronic pain syndrome: Secondary | ICD-10-CM | POA: Diagnosis not present

## 2016-05-06 DIAGNOSIS — Z79891 Long term (current) use of opiate analgesic: Secondary | ICD-10-CM | POA: Diagnosis not present

## 2016-05-06 MED FILL — traMADol HCL 50 MG TABS: 50 | 30 days supply | Qty: 60 | Fill #0

## 2016-05-06 MED FILL — traMADol HCL ER 300 MG TB24: 300 | 30 days supply | Qty: 30 | Fill #0

## 2016-05-11 ENCOUNTER — Encounter: Payer: Self-pay | Admitting: Family

## 2016-05-11 ENCOUNTER — Ambulatory Visit (INDEPENDENT_AMBULATORY_CARE_PROVIDER_SITE_OTHER): Payer: 59 | Admitting: Family

## 2016-05-11 DIAGNOSIS — L732 Hidradenitis suppurativa: Secondary | ICD-10-CM | POA: Diagnosis not present

## 2016-05-11 NOTE — Progress Notes (Signed)
Subjective:    Patient ID: Tara CrateShani J Mcduffie, female    DOB: 08-27-1980, 35 y.o.   MRN: [id number]  Chief Complaint  Patient presents with  . Disability    for schooling     HPI:  Tara CrateShani J Fabry is a 35 y.o. female who  has a past medical history of Anxiety; Condyloma acuminata; Depression; Hidradenitis suppurativa; Peptic ulcer disease; and Pilonidal cyst. and presents today for a follow up office visit.   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 She is scheduled to graduate in May in and there is a tuition surcharge that she is facing and secondary to health issues she has had to extend her schooling. Expresses that she has had to drop at least 3-4 classes secondary to her exacerbations of Hidradenitis Supprativa resulting in pain for which she is also managed through pain medicine. She continues to take the clindamycin and hydrocodone-acetaminophen as needed for pain. Notes that her symptoms are currently adequately maintained with the current regimen.   Allergies  Allergen Reactions  . Doxycycline Swelling    SWELLING IN OPTIC NERVE.      Outpatient Medications Prior to Visit  Medication Sig Dispense Refill  . clindamycin (CLEOCIN T) 1 % external solution APPLY TOPICALLY TWICE DAILY 30 mL 0  . clindamycin (CLEOCIN) 300 MG capsule TAKE 1 CAPSULE(300 MG) BY MOUTH FOUR TIMES DAILY 40 capsule 0  . Desoximetasone (TOPICORT) 0.25 % ointment APPLY TOPICALLY TWICE DAILY 15 g 3  . fluticasone (FLONASE) 50 MCG/ACT nasal spray Place 2 sprays into both nostrils daily. 9.9 g 2  . guaiFENesin-codeine 100-10 MG/5ML syrup Take 5 mLs by mouth 3 (three) times daily as needed for cough. 120 mL 0  . HYDROcodone-acetaminophen (NORCO) 5-325 MG tablet Take 1 tablet by mouth every 6 (six) hours as needed for moderate pain. 28 tablet 0  . oxyCODONE-acetaminophen (PERCOCET/ROXICET) 5-325 MG tablet Take 1-2 tablets by mouth once. 15 tablet 0  . traMADol (ULTRAM) 50 MG tablet Take 100 mg by mouth every 6  (six) hours as needed for moderate pain.    . traMADol (ULTRAM-ER) 300 MG 24 hr tablet Take 300 mg by mouth daily.     No facility-administered medications prior to visit.     Review of Systems  Constitutional: Negative for chills and fever.  Respiratory: Negative for chest tightness and shortness of breath.   Cardiovascular: Negative for chest pain.  Musculoskeletal: Positive for neck pain.  Skin: Negative for rash and wound.      Objective:    BP 110/78 (BP Location: Left Arm, Patient Position: Sitting, Cuff Size: Large)   Pulse 92   Temp 98.2 F (36.8 C) (Oral)   Resp 16   Ht 5\' 9"  (1.753 m)   Wt 256 lb (116.1 kg)   SpO2 98%   BMI 37.80 kg/m  Nursing note and vital signs reviewed.  Physical Exam  Constitutional: She is oriented to person, place, and time. She appears well-developed and well-nourished. No distress.  Cardiovascular: Normal rate, regular rhythm, normal heart sounds and intact distal pulses.   Pulmonary/Chest: Effort normal and breath sounds normal.  Neurological: She is alert and oriented to person, place, and time.  Skin: Skin is warm and dry.  Psychiatric: She has a normal mood and affect. Her behavior is normal. Judgment and thought content normal.       Assessment & Plan:   Problem List Items Addressed This Visit      Musculoskeletal and Integument   HIDRADENITIS SUPPURATIVA  Appears stable with current regimen and no adverse side effects. Letter written for school regarding exacerbations of Hidradenitis Suppurativa. Continue current dosage of clindamycin and management of pain through pain medicine. Continue to monitor.           I am having Ms. Mceachron maintain her traMADol, traMADol, Desoximetasone, HYDROcodone-acetaminophen, clindamycin, clindamycin, oxyCODONE-acetaminophen, guaiFENesin-codeine, and fluticasone.   Follow-up: Return if symptoms worsen or fail to improve.  Jeanine Luzalone, Ruther Ephraim, FNP

## 2016-05-11 NOTE — Patient Instructions (Signed)
Thank you for choosing [facility].  SUMMARY AND INSTRUCTIONS:  Please continue to take your medications as prescribed.   Follow up:  If your symptoms worsen or fail to improve, please contact our office for further instruction, or in case of emergency go directly to the emergency room at the closest medical facility.     

## 2016-05-11 NOTE — Assessment & Plan Note (Signed)
Appears stable with current regimen and no adverse side effects. Letter written for school regarding exacerbations of Hidradenitis Suppurativa. Continue current dosage of clindamycin and management of pain through pain medicine. Continue to monitor.

## 2016-06-06 MED FILL — traMADol HCL ER 300 MG TB24: 300 | 30 days supply | Qty: 30 | Fill #1

## 2016-06-06 MED FILL — traMADol HCL 50 MG TABS: 50 | 30 days supply | Qty: 60 | Fill #1

## 2016-07-04 MED FILL — traMADol HCL ER 300 MG TB24: 300 | 30 days supply | Qty: 30 | Fill #2

## 2016-07-04 MED FILL — traMADol HCL 50 MG TABS: 50 | 30 days supply | Qty: 60 | Fill #2

## 2016-08-04 DIAGNOSIS — Z79899 Other long term (current) drug therapy: Secondary | ICD-10-CM | POA: Diagnosis not present

## 2016-08-04 DIAGNOSIS — G894 Chronic pain syndrome: Secondary | ICD-10-CM | POA: Diagnosis not present

## 2016-08-04 DIAGNOSIS — Z79891 Long term (current) use of opiate analgesic: Secondary | ICD-10-CM | POA: Diagnosis not present

## 2016-08-04 DIAGNOSIS — M542 Cervicalgia: Secondary | ICD-10-CM | POA: Diagnosis not present

## 2016-09-01 DIAGNOSIS — F4320 Adjustment disorder, unspecified: Secondary | ICD-10-CM | POA: Diagnosis not present

## 2016-09-04 ENCOUNTER — Encounter (HOSPITAL_COMMUNITY): Payer: Self-pay | Admitting: Oncology

## 2016-09-04 ENCOUNTER — Emergency Department (HOSPITAL_COMMUNITY)
Admission: EM | Admit: 2016-09-04 | Discharge: 2016-09-05 | Disposition: A | Discharge status: Home / Self Care | Payer: 59 | Attending: Emergency Medicine | Admitting: Emergency Medicine

## 2016-09-04 DIAGNOSIS — Z79899 Other long term (current) drug therapy: Secondary | ICD-10-CM | POA: Diagnosis not present

## 2016-09-04 DIAGNOSIS — N390 Urinary tract infection, site not specified: Secondary | ICD-10-CM | POA: Insufficient documentation

## 2016-09-04 DIAGNOSIS — Z87891 Personal history of nicotine dependence: Secondary | ICD-10-CM | POA: Diagnosis not present

## 2016-09-04 NOTE — ED Triage Notes (Signed)
Pt c/o urgency, frequency, hematuria and lower abdominal pain x 3 hours.  Pt has had UTI's in the past however states this came on much quicker than she has experienced in the past.  Pt rates pain 7/10.

## 2016-09-04 NOTE — ED Provider Notes (Signed)
[facility]-EMERGENCY DEPT Provider Note   CSN: [id number] Arrival date & time: 09/04/16  2313  By signing my name below, I, Tara Ward, attest that this documentation has been prepared under the direction and in the presence of Tara Kohles C. Aviel Davalos, PA-C. Electronically Signed: Marnette Burgessyan Andrew Ward, Scribe. 09/04/2016. 1:15 AM.   History   Chief Complaint Chief Complaint  Patient presents with  . Urinary Tract Infection   The history is provided by the patient and medical records. No language interpreter was used.    HPI Comments:  Tara Ward is an obese 36 y.o. female with a PMHx of Pilonidal cyst, Peptic Ulcer Disease, Recurrent Hidradenitis Suppurativa, and Condyloma Acuminata with a PSHx of Tubal Ligation, who presents to the Emergency Department complaining of constant, sharp, pressure-like, suprapubic, 7/10 abdominal pain onset 8:00PM yesterday. Per pt, she has a h/o UTI's which today's symptoms feel similar to; however, her symptoms today arose much faster than in the past with sudden onset pain and dysuria arising while using the restroom yesterday evening. Pt has associated symptoms of frequency, urgency, dysuria, difficulty urinating, hematuria. She reports no BM since the onset of her symptoms. No h/o renal calculi. She tried tramadol at home with mild relief of her symptoms. Pt denies fever, nausea, vomiting, diarrhea, and any other complaints at this time. She is a former smoker with quit date- 07/12/15.    Past Medical History:  Diagnosis Date  . Anxiety   . Condyloma acuminata   . Depression   . Hidradenitis suppurativa    recurrent  . Peptic ulcer disease   . Pilonidal cyst     Patient Active Problem List   Diagnosis Date Noted  . Acute upper respiratory infection 01/08/2016  . Health care maintenance 08/21/2013  . Chronic upper back pain 07/31/2013  . Pilonidal cyst with abscess 05/11/2013  . Lichen simplex chronicus 11/09/2011  . Perforation of tympanic  membrane, traumatic 06/09/2011  . Benign positional vertigo 02/10/2011  . Eustachian tube dysfunction 02/10/2011  . OBESITY 02/22/2010  . TOBACCO USE 02/22/2010  . DEPRESSION 02/22/2010  . PEPTIC ULCER DISEASE 02/22/2010  . HIDRADENITIS SUPPURATIVA 02/22/2010    Past Surgical History:  Procedure Laterality Date  . TUBAL LIGATION  2004    OB History    No data available      Home Medications    Prior to Admission medications   Medication Sig Start Date End Date Taking? Authorizing Provider  traMADol (ULTRAM) 50 MG tablet Take 50 mg by mouth 3 (three) times daily as needed for moderate pain.    Yes Historical Provider, MD  traMADol (ULTRAM-ER) 300 MG 24 hr tablet Take 300 mg by mouth every morning.    Yes Historical Provider, MD  cephALEXin (KEFLEX) 500 MG capsule Take 1 capsule (500 mg total) by mouth 4 (four) times daily. 09/05/16   Tiffinie Caillier, PA-C  fluconazole (DIFLUCAN) 150 MG tablet Take 1 tablet (150 mg total) by mouth once. 09/05/16 09/05/16  Odelle Kosier, PA-C  phenazopyridine (PYRIDIUM) 200 MG tablet Take 1 tablet (200 mg total) by mouth 3 (three) times daily as needed for pain. 09/05/16   Dahlia ClientHannah Elon Lomeli, PA-C    Family History Family History  Problem Relation Age of Onset  . Hyperthyroidism Mother   . Diabetes Father   . Cancer Other     lung  . Diabetes Other     1st degree relative  . Cancer Paternal Uncle     gastric    Social History  Social History  Substance Use Topics  . Smoking status: Former Smoker    Packs/day: 1.00    Years: 15.00    Types: Cigarettes    Quit date: 07/12/2015  . Smokeless tobacco: Never Used  . Alcohol use 0.0 oz/week     Comment: socially      Allergies   Doxycycline   Review of Systems Review of Systems  Constitutional: Negative for fever.  Gastrointestinal: Positive for abdominal pain. Negative for diarrhea, nausea and vomiting.  Genitourinary: Positive for difficulty urinating, dysuria,  frequency, hematuria and urgency.  Neurological: Positive for headaches.  All other systems reviewed and are negative.   Physical Exam Updated Vital Signs BP 122/78 (BP Location: Left Arm)   Pulse 95   Temp 98.6 F (37 C) (Oral)   Resp 18   Ht 5\' 9"  (1.753 m)   Wt 111.1 kg   LMP 08/29/2016 (Exact Date)   SpO2 98%   BMI 36.18 kg/m   Physical Exam  Constitutional: She appears well-developed and well-nourished. No distress.  Awake, alert, nontoxic appearance  HENT:  Head: Normocephalic and atraumatic.  Mouth/Throat: Oropharynx is clear and moist. No oropharyngeal exudate.  Eyes: Conjunctivae are normal. No scleral icterus.  Neck: Normal range of motion. Neck supple.  Cardiovascular: Normal rate, regular rhythm and intact distal pulses.   Pulmonary/Chest: Effort normal and breath sounds normal. No respiratory distress. She has no wheezes.  Equal chest expansion  Abdominal: Soft. Bowel sounds are normal. She exhibits no distension and no mass. There is tenderness ( mild) in the suprapubic area. There is CVA tenderness ( right, very mild ). There is no rebound and no guarding.  Musculoskeletal: Normal range of motion. She exhibits no edema.  Neurological: She is alert.  Speech is clear and goal oriented Moves extremities without ataxia  Skin: Skin is warm and dry. She is not diaphoretic.  Psychiatric: She has a normal mood and affect.  Nursing note and vitals reviewed.    ED Treatments / Results  DIAGNOSTIC STUDIES:  Oxygen Saturation is 98% on RA, normal by my interpretation.    COORDINATION OF CARE:  12:59 AM Discussed treatment plan with pt at bedside and pt agreed to plan.  Labs (all labs ordered are listed, but only abnormal results are displayed) Labs Reviewed  URINALYSIS, ROUTINE W REFLEX MICROSCOPIC - Abnormal; Notable for the following:       Result Value   APPearance CLOUDY (*)    Hgb urine dipstick LARGE (*)    Protein, ur 100 (*)    Leukocytes, UA LARGE  (*)    Squamous Epithelial / LPF 0-5 (*)    All other components within normal limits  POC URINE PREG, ED     Procedures Procedures (including critical care time)  Medications Ordered in ED Medications  phenazopyridine (PYRIDIUM) tablet 200 mg (200 mg Oral Given 09/05/16 0124)  cephALEXin (KEFLEX) capsule 500 mg (500 mg Oral Given 09/05/16 0124)     Initial Impression / Assessment and Plan / ED Course  I have reviewed the triage vital signs and the nursing notes.  Pertinent labs & imaging results that were available during my care of the patient were reviewed by me and considered in my medical decision making (see chart for details).     Patient presents with symptoms of the cystitis. Urinalysis confirms same. She is afebrile with normal vital signs. Very mild right CVA tenderness with potential for very early pyelonephritis. Patient without nausea or vomiting. Will treat  for cystitis with Keflex and Pyridium. Patient requests Diflucan for use after her antibiotics. Patient given prescriptions for these things. Discussed potential for early Pylo with patient who states understanding. She will follow with primary care this week. She will return to the emergency room if worsening symptoms.  Final Clinical Impressions(s) / ED Diagnoses   Final diagnoses:  Lower urinary tract infectious disease    New Prescriptions Discharge Medication List as of 09/05/2016  1:33 AM    START taking these medications   Details  cephALEXin (KEFLEX) 500 MG capsule Take 1 capsule (500 mg total) by mouth 4 (four) times daily., Starting Mon 09/05/2016, Print    phenazopyridine (PYRIDIUM) 200 MG tablet Take 1 tablet (200 mg total) by mouth 3 (three) times daily as needed for pain., Starting Mon 09/05/2016, Print       I personally performed the services described in this documentation, which was scribed in my presence. The recorded information has been reviewed and is accurate.     Dahlia Client Loys Shugars,  PA-C 09/05/16 0210    Devoria Albe, MD 09/05/16 0345

## 2016-09-05 DIAGNOSIS — Z79899 Other long term (current) drug therapy: Secondary | ICD-10-CM | POA: Diagnosis not present

## 2016-09-05 DIAGNOSIS — N390 Urinary tract infection, site not specified: Secondary | ICD-10-CM | POA: Diagnosis not present

## 2016-09-05 DIAGNOSIS — Z87891 Personal history of nicotine dependence: Secondary | ICD-10-CM | POA: Diagnosis not present

## 2016-09-05 LAB — URINALYSIS, ROUTINE W REFLEX MICROSCOPIC
Bacteria, UA: NONE SEEN
Bilirubin Urine: NEGATIVE
Glucose, UA: NEGATIVE mg/dL
Ketones, ur: NEGATIVE mg/dL
Nitrite: NEGATIVE
Protein, ur: 100 mg/dL — AB
Specific Gravity, Urine: 1.018 (ref 1.005–1.030)
pH: 6 (ref 5.0–8.0)

## 2016-09-05 MED ORDER — CEPHALEXIN 500 MG PO CAPS: 500.0000 mg | ORAL_CAPSULE | Freq: Four times a day (QID) | ORAL | 0 refills | Status: DC

## 2016-09-05 MED ORDER — PHENAZOPYRIDINE HCL 200 MG PO TABS: 200.0000 mg | ORAL_TABLET | Freq: Three times a day (TID) | ORAL | 0 refills | Status: DC | PRN

## 2016-09-05 MED ORDER — CEPHALEXIN 500 MG PO CAPS
500.0000 mg | ORAL_CAPSULE | Freq: Once | ORAL | Status: AC
  Administered 2016-09-05: 500 mg via ORAL
  Filled 2016-09-05: qty 1

## 2016-09-05 MED ORDER — PHENAZOPYRIDINE HCL 200 MG PO TABS
200.0000 mg | ORAL_TABLET | Freq: Once | ORAL | Status: AC
  Administered 2016-09-05: 200 mg via ORAL
  Filled 2016-09-05: qty 1

## 2016-09-05 MED ORDER — FLUCONAZOLE 150 MG PO TABS: 150.0000 mg | ORAL_TABLET | Freq: Once | ORAL | 0 refills | Status: AC

## 2016-09-05 NOTE — ED Notes (Signed)
ED Provider at bedside. 

## 2016-09-05 NOTE — Discharge Instructions (Signed)
1. Medications: keflex, pyridium, usual home medications °2. Treatment: rest, drink plenty of fluids, take medications as prescribed °3. Follow Up: Please followup with your primary doctor in 3 days for discussion of your diagnoses and further evaluation after today's visit; if you do not have a primary care doctor use the resource guide provided to find one; return to the ER for fevers, persistent vomiting, worsening abdominal pain or other concerning symptoms. ° °

## 2016-09-06 ENCOUNTER — Encounter: Payer: Self-pay | Admitting: Family

## 2016-09-06 MED ORDER — PHENAZOPYRIDINE HCL 200 MG PO TABS: 200.0000 mg | ORAL_TABLET | Freq: Three times a day (TID) | ORAL | 1 refills | Status: DC | PRN

## 2016-09-08 DIAGNOSIS — F4320 Adjustment disorder, unspecified: Secondary | ICD-10-CM | POA: Diagnosis not present

## 2016-09-12 ENCOUNTER — Encounter: Payer: Self-pay | Admitting: Family

## 2016-09-12 MED ORDER — CIPROFLOXACIN HCL 500 MG PO TABS: 500.0000 mg | ORAL_TABLET | Freq: Two times a day (BID) | ORAL | 0 refills | Status: DC

## 2016-09-13 DIAGNOSIS — F4320 Adjustment disorder, unspecified: Secondary | ICD-10-CM | POA: Diagnosis not present

## 2016-09-20 DIAGNOSIS — F4320 Adjustment disorder, unspecified: Secondary | ICD-10-CM | POA: Diagnosis not present

## 2016-10-04 DIAGNOSIS — F4320 Adjustment disorder, unspecified: Secondary | ICD-10-CM | POA: Diagnosis not present

## 2016-10-06 ENCOUNTER — Emergency Department (HOSPITAL_COMMUNITY): Payer: 59

## 2016-10-06 ENCOUNTER — Encounter (HOSPITAL_COMMUNITY): Payer: Self-pay | Admitting: Vascular Surgery

## 2016-10-06 DIAGNOSIS — R1013 Epigastric pain: Secondary | ICD-10-CM | POA: Diagnosis not present

## 2016-10-06 DIAGNOSIS — Z87891 Personal history of nicotine dependence: Secondary | ICD-10-CM | POA: Diagnosis not present

## 2016-10-06 DIAGNOSIS — K2900 Acute gastritis without bleeding: Secondary | ICD-10-CM | POA: Insufficient documentation

## 2016-10-06 DIAGNOSIS — R079 Chest pain, unspecified: Secondary | ICD-10-CM | POA: Diagnosis not present

## 2016-10-06 LAB — BASIC METABOLIC PANEL
Anion gap: 11 (ref 5–15)
BUN: 8 mg/dL (ref 6–20)
CO2: 25 mmol/L (ref 22–32)
Calcium: 9.3 mg/dL (ref 8.9–10.3)
Chloride: 102 mmol/L (ref 101–111)
Creatinine, Ser: 0.71 mg/dL (ref 0.44–1.00)
GFR calc Af Amer: >60 mL/min (ref >60)
GFR calc non Af Amer: >60 mL/min (ref >60)
Glucose, Bld: 98 mg/dL (ref 65–99)
Potassium: 4.1 mmol/L (ref 3.5–5.1)
Sodium: 138 mmol/L (ref 135–145)

## 2016-10-06 LAB — CBC
HCT: 40 % (ref 36.0–46.0)
Hemoglobin: 12.7 g/dL (ref 12.0–15.0)
MCH: 27.4 pg (ref 26.0–34.0)
MCHC: 31.8 g/dL (ref 30.0–36.0)
MCV: 86.2 fL (ref 78.0–100.0)
Platelets: 406 10*3/uL — ABNORMAL HIGH (ref 150–400)
RBC: 4.64 MIL/uL (ref 3.87–5.11)
RDW: 14.1 % (ref 11.5–15.5)
WBC: 11.6 10*3/uL — ABNORMAL HIGH (ref 4.0–10.5)

## 2016-10-06 LAB — I-STAT TROPONIN, ED: Troponin i, poc: 0 ng/mL (ref 0.00–0.08)

## 2016-10-06 NOTE — ED Triage Notes (Signed)
Pt states that for the past few days she has had a nagging left arm pain. She has tingling all the way down to her fingertips. States that she has also had some CP that began on Tuesday. She states that she was belching a lot so she assumed that the CP was related to indigestion. She drank a Pepsi and it helped the pain a little bit. The pain never completely went away. Describes the pain as a sharp pain with intermittent chest pressure. She reports some associated SOB. Denies any other associated symptoms.

## 2016-10-07 ENCOUNTER — Encounter (HOSPITAL_COMMUNITY): Payer: Self-pay | Admitting: Emergency Medicine

## 2016-10-07 ENCOUNTER — Emergency Department (HOSPITAL_COMMUNITY): Payer: 59

## 2016-10-07 ENCOUNTER — Emergency Department (HOSPITAL_COMMUNITY)
Admission: EM | Admit: 2016-10-07 | Discharge: 2016-10-07 | Disposition: A | Discharge status: Home / Self Care | Payer: 59 | Attending: Emergency Medicine | Admitting: Emergency Medicine

## 2016-10-07 DIAGNOSIS — M542 Cervicalgia: Secondary | ICD-10-CM | POA: Diagnosis not present

## 2016-10-07 DIAGNOSIS — K2900 Acute gastritis without bleeding: Secondary | ICD-10-CM

## 2016-10-07 LAB — I-STAT TROPONIN, ED: Troponin i, poc: 0 ng/mL (ref 0.00–0.08)

## 2016-10-07 LAB — D-DIMER, QUANTITATIVE: D-Dimer, Quant: <0.27 ug/mL-FEU (ref 0.00–0.50)

## 2016-10-07 MED ORDER — KETOROLAC TROMETHAMINE 60 MG/2ML IM SOLN
60.0000 mg | Freq: Once | INTRAMUSCULAR | Status: AC
  Administered 2016-10-07: 60 mg via INTRAMUSCULAR
  Filled 2016-10-07: qty 2

## 2016-10-07 MED ORDER — OMEPRAZOLE 20 MG PO CPDR: 20.0000 mg | DELAYED_RELEASE_CAPSULE | Freq: Every day | ORAL | 0 refills | Status: DC

## 2016-10-07 MED ORDER — GI COCKTAIL ~~LOC~~
30.0000 mL | Freq: Once | ORAL | Status: AC
  Administered 2016-10-07: 30 mL via ORAL
  Filled 2016-10-07: qty 30

## 2016-10-07 NOTE — ED Notes (Signed)
Patient transported to X-ray 

## 2016-10-07 NOTE — ED Provider Notes (Signed)
[facility]-EMERGENCY DEPT Provider Note   CSN: [id number] Arrival date & time: 10/06/16  2210  By signing my name below, I, Elder Negus, attest that this documentation has been prepared under the direction and in the presence of Adora Yeh, MD. Electronically Signed: Elder Negus, Scribe. 10/07/16. 12:41 AM.   History   Chief Complaint Chief Complaint  Patient presents with  . Chest Pain    HPI Tara Ward is a 36 y.o. female with history of anxiety, peptic ulcers who presents for arm pain and chest pain. The patient states that 5 days ago she initially developed L sided arm pain with paresthesias across the entire arm. No focal weakness or decreased sensation. In the last 2 days she has also experienced intermittent chest pressure and presented today for workup. She denies any dyspnea. No leg swelling or recent travel.  The history is provided by the patient. No language interpreter was used.  Chest Pain   This is a new problem. The current episode started more than 2 days ago. The problem occurs constantly. The problem has not changed since onset.The pain is present in the epigastric region. The pain is moderate. Quality: dull. The pain does not radiate. Pertinent negatives include no abdominal pain, no back pain, no cough, no fever, no palpitations, no shortness of breath and no vomiting. Treatments tried: pepsi. The treatment provided moderate relief. Risk factors include obesity.  Pertinent negatives for past medical history include no aneurysm, no Marfan's syndrome and no seizures.  Pertinent negatives for family medical history include: no Marfan's syndrome.  Procedure history is negative for cardiac catheterization.    Past Medical History:  Diagnosis Date  . Anxiety   . Condyloma acuminata   . Depression   . Hidradenitis suppurativa    recurrent  . Peptic ulcer disease   . Pilonidal cyst     Patient Active Problem List   Diagnosis Date Noted  . Acute upper  respiratory infection 01/08/2016  . Health care maintenance 08/21/2013  . Chronic upper back pain 07/31/2013  . Pilonidal cyst with abscess 05/11/2013  . Lichen simplex chronicus 11/09/2011  . Perforation of tympanic membrane, traumatic 06/09/2011  . Benign positional vertigo 02/10/2011  . Eustachian tube dysfunction 02/10/2011  . OBESITY 02/22/2010  . TOBACCO USE 02/22/2010  . DEPRESSION 02/22/2010  . PEPTIC ULCER DISEASE 02/22/2010  . HIDRADENITIS SUPPURATIVA 02/22/2010    Past Surgical History:  Procedure Laterality Date  . TUBAL LIGATION  2004    OB History    No data available       Home Medications    Prior to Admission medications   Medication Sig Start Date End Date Taking? Authorizing Provider  ciprofloxacin (CIPRO) 500 MG tablet Take 1 tablet (500 mg total) by mouth 2 (two) times daily. 09/12/16   Veryl Speak, FNP  phenazopyridine (PYRIDIUM) 200 MG tablet Take 1 tablet (200 mg total) by mouth 3 (three) times daily as needed for pain. 09/06/16   Veryl Speak, FNP  traMADol (ULTRAM) 50 MG tablet Take 50 mg by mouth 3 (three) times daily as needed for moderate pain.     Historical Provider, MD  traMADol (ULTRAM-ER) 300 MG 24 hr tablet Take 300 mg by mouth every morning.     Historical Provider, MD    Family History Family History  Problem Relation Age of Onset  . Hyperthyroidism Mother   . Diabetes Father   . Cancer Other     lung  . Diabetes Other  1st degree relative  . Cancer Paternal Uncle     gastric    Social History Social History  Substance Use Topics  . Smoking status: Former Smoker    Packs/day: 1.00    Years: 15.00    Types: Cigarettes    Quit date: 07/12/2015  . Smokeless tobacco: Never Used  . Alcohol use 0.0 oz/week     Comment: socially      Allergies   Doxycycline   Review of Systems Review of Systems  Constitutional: Negative for chills and fever.  HENT: Negative for ear pain and sore throat.   Eyes: Negative for  pain and visual disturbance.  Respiratory: Negative for cough and shortness of breath.   Cardiovascular: Positive for chest pain. Negative for palpitations and leg swelling.  Gastrointestinal: Negative for abdominal pain and vomiting.  Genitourinary: Negative for dysuria and hematuria.  Musculoskeletal: Negative for arthralgias and back pain.       L arm pain  Skin: Negative for color change and rash.  Neurological: Negative for seizures and syncope.  All other systems reviewed and are negative.    Physical Exam Updated Vital Signs BP 125/81 (BP Location: Left Arm)   Pulse 92   Temp 98.5 F (36.9 C) (Oral)   Resp 18   Ht  (1.778 m)   Wt 255 lb (115.7 kg)   SpO2 100%   BMI 36.59 kg/m   Physical Exam  Constitutional: She is oriented to person, place, and time. She appears well-developed and well-nourished. No distress.  HENT:  Head: Normocephalic and atraumatic.  Mouth/Throat: No oropharyngeal exudate.  Eyes: Conjunctivae are normal. Pupils are equal, round, and reactive to light.  Neck: Normal range of motion. Neck supple. No JVD present. Carotid bruit is not present.  Cardiovascular: Normal rate and regular rhythm.   No murmur heard. Pulmonary/Chest: Effort normal and breath sounds normal. No stridor. No respiratory distress. She has no wheezes. She has no rales. She exhibits no tenderness.  Abdominal: Soft. She exhibits no mass. There is no tenderness. There is no rebound and no guarding.  Gassy.  Musculoskeletal: She exhibits no edema, tenderness or deformity.  Muscle spasms in the L trapezius. Negative Neer's sign.   Neurological: She is alert and oriented to person, place, and time. She has normal reflexes. She displays normal reflexes. No sensory deficit. She exhibits normal muscle tone. Coordination normal. She displays no Babinski's sign on the right side. She displays no Babinski's sign on the left side.  5/5 strength in all four extremities.   Skin: Skin is  warm and dry. Capillary refill takes less than 2 seconds.  Psychiatric: She has a normal mood and affect.  Nursing note and vitals reviewed.    ED Treatments / Results   Vitals:   10/07/16 0330 10/07/16 0345  BP: 118/85 107/74  Pulse: 85 84  Resp: 18 19  Temp:      Labs (all labs ordered are listed, but only abnormal results are displayed)  Results for orders placed or performed during the hospital encounter of 10/07/16  Basic metabolic panel  Result Value Ref Range   Sodium 138 135 - 145 mmol/L   Potassium 4.1 3.5 - 5.1 mmol/L   Chloride 102 101 - 111 mmol/L   CO2 25 22 - 32 mmol/L   Glucose, Bld 98 65 - 99 mg/dL   BUN 8 6 - 20 mg/dL   Creatinine, Ser 1.61 0.44 - 1.00 mg/dL   Calcium 9.3 8.9 - 10.3  mg/dL   GFR calc non Af Amer >60 >60 mL/min   GFR calc Af Amer >60 >60 mL/min   Anion gap 11 5 - 15  CBC  Result Value Ref Range   WBC 11.6 (H) 4.0 - 10.5 K/uL   RBC 4.64 3.87 - 5.11 MIL/uL   Hemoglobin 12.7 12.0 - 15.0 g/dL   HCT 32.4 40.1 - 02.7 %   MCV 86.2 78.0 - 100.0 fL   MCH 27.4 26.0 - 34.0 pg   MCHC 31.8 30.0 - 36.0 g/dL   RDW 25.3 66.4 - 40.3 %   Platelets 406 (H) 150 - 400 K/uL  D-dimer, quantitative (not at [facility])  Result Value Ref Range   D-Dimer, Quant <0.27 0.00 - 0.50 ug/mL-FEU  I-stat troponin, ED  Result Value Ref Range   Troponin i, poc 0.00 0.00 - 0.08 ng/mL   Comment 3          I-stat troponin, ED  Result Value Ref Range   Troponin i, poc 0.00 0.00 - 0.08 ng/mL   Comment 3           Dg Chest 2 View  Result Date: 10/06/2016 CLINICAL DATA:  Chest pain and dyspnea times 2-3 days EXAM: CHEST  2 VIEW COMPARISON:  01/04/2016 CXR FINDINGS: The heart size and mediastinal contours are within normal limits. No pneumonic consolidation or CHF. No effusion or pneumothorax. Minimal atelectasis and/or scarring at the left lung base. The visualized skeletal structures are unremarkable. IMPRESSION: No active cardiopulmonary disease. Minimal atelectasis and/or  scarring at the left lung base. Electronically Signed   By: Tollie Eth M.D.   On: 10/06/2016 23:09   Dg Cervical Spine Complete  Result Date: 10/07/2016 CLINICAL DATA:  Neck pain EXAM: CERVICAL SPINE - COMPLETE 4+ VIEW COMPARISON:  None. FINDINGS: There is no evidence of cervical spine fracture or prevertebral soft tissue swelling. Alignment is normal. No jumped appearing facets. The C1-C2 relationship and atlantodental interval are maintained. The craniocervical relationship appears intact. No significant neural foraminal encroachment. IMPRESSION: Negative cervical spine radiographs. Electronically Signed   By: Tollie Eth M.D.   On: 10/07/2016 01:37     EKG Interpretation  Date/Time:    Ventricular Rate:    PR Interval:    QRS Duration:   QT Interval:    QTC Calculation:   R Axis:     Text Interpretation:         EKG  EKG Interpretation None       Radiology Dg Chest 2 View  Result Date: 10/06/2016 CLINICAL DATA:  Chest pain and dyspnea times 2-3 days EXAM: CHEST  2 VIEW COMPARISON:  01/04/2016 CXR FINDINGS: The heart size and mediastinal contours are within normal limits. No pneumonic consolidation or CHF. No effusion or pneumothorax. Minimal atelectasis and/or scarring at the left lung base. The visualized skeletal structures are unremarkable. IMPRESSION: No active cardiopulmonary disease. Minimal atelectasis and/or scarring at the left lung base. Electronically Signed   By: Tollie Eth M.D.   On: 10/06/2016 23:09    Procedures Procedures (including critical care time)  Medications Ordered in ED  Results for orders placed or performed during the hospital encounter of 10/07/16  Basic metabolic panel  Result Value Ref Range   Sodium 138 135 - 145 mmol/L   Potassium 4.1 3.5 - 5.1 mmol/L   Chloride 102 101 - 111 mmol/L   CO2 25 22 - 32 mmol/L   Glucose, Bld 98 65 - 99 mg/dL   BUN 8  6 - 20 mg/dL   Creatinine, Ser 1.61 0.44 - 1.00 mg/dL   Calcium 9.3 8.9 - 09.6 mg/dL    GFR calc non Af Amer >60 >60 mL/min   GFR calc Af Amer >60 >60 mL/min   Anion gap 11 5 - 15  CBC  Result Value Ref Range   WBC 11.6 (H) 4.0 - 10.5 K/uL   RBC 4.64 3.87 - 5.11 MIL/uL   Hemoglobin 12.7 12.0 - 15.0 g/dL   HCT 04.5 40.9 - 81.1 %   MCV 86.2 78.0 - 100.0 fL   MCH 27.4 26.0 - 34.0 pg   MCHC 31.8 30.0 - 36.0 g/dL   RDW 91.4 78.2 - 95.6 %   Platelets 406 (H) 150 - 400 K/uL  D-dimer, quantitative (not at [facility])  Result Value Ref Range   D-Dimer, Quant <0.27 0.00 - 0.50 ug/mL-FEU  I-stat troponin, ED  Result Value Ref Range   Troponin i, poc 0.00 0.00 - 0.08 ng/mL   Comment 3          I-stat troponin, ED  Result Value Ref Range   Troponin i, poc 0.00 0.00 - 0.08 ng/mL   Comment 3           Dg Chest 2 View  Result Date: 10/06/2016 CLINICAL DATA:  Chest pain and dyspnea times 2-3 days EXAM: CHEST  2 VIEW COMPARISON:  01/04/2016 CXR FINDINGS: The heart size and mediastinal contours are within normal limits. No pneumonic consolidation or CHF. No effusion or pneumothorax. Minimal atelectasis and/or scarring at the left lung base. The visualized skeletal structures are unremarkable. IMPRESSION: No active cardiopulmonary disease. Minimal atelectasis and/or scarring at the left lung base. Electronically Signed   By: Tollie Eth M.D.   On: 10/06/2016 23:09   Dg Cervical Spine Complete  Result Date: 10/07/2016 CLINICAL DATA:  Neck pain EXAM: CERVICAL SPINE - COMPLETE 4+ VIEW COMPARISON:  None. FINDINGS: There is no evidence of cervical spine fracture or prevertebral soft tissue swelling. Alignment is normal. No jumped appearing facets. The C1-C2 relationship and atlantodental interval are maintained. The craniocervical relationship appears intact. No significant neural foraminal encroachment. IMPRESSION: Negative cervical spine radiographs. Electronically Signed   By: Tollie Eth M.D.   On: 10/07/2016 01:37    EKG Interpretation  Date/Time:  Thursday Jaydin Jalomo 12 2018 22:15:32  EDT Ventricular Rate:  97 PR Interval:  168 QRS Duration: 92 QT Interval:  364 QTC Calculation: 462 R Axis:   95 Text Interpretation:  Normal sinus rhythm Confirmed by [facility]  MD, Kenslee Achorn ([id number]) on 10/07/2016 4:19:17 AM       Medications  gi cocktail (Maalox,Lidocaine,Donnatal) (30 mLs Oral Given 10/07/16 0053)  ketorolac (TORADOL) injection 60 mg (60 mg Intramuscular Given 10/07/16 0053)   Negative Ddimer HEART score is one low risk for MACE and ruled out for MI in the ED.    Final Clinical Impressions(s) / ED Diagnoses  Gastritis:  Exam, and vitals are benign and reassuring. The patient is nontoxic-appearing and labs and imaging is negative for acute finding.  Moreover, sx improved with Pepsi and belching consistent with GERD.  Given ongoing symptoms tests would be positive.  Her arm is neurovascularly intact and has sensation and is a separate issue.  I suspect radicular pain.  No acute C spine findings.  Will start GERD friendly diet and PPI and have patient follow up with PMD.   After history, exam, and medical workup I feel the patient has been appropriately medically screened and  is safe for discharge home. Pertinent diagnoses were discussed with the patient. Patient was given return precautions.  I personally performed the services described in this documentation, which was scribed in my presence. The recorded information has been reviewed and is accurate.      Cy Blamer, MD 10/07/16 0425

## 2016-10-11 DIAGNOSIS — F4320 Adjustment disorder, unspecified: Secondary | ICD-10-CM | POA: Diagnosis not present

## 2016-10-18 DIAGNOSIS — F4320 Adjustment disorder, unspecified: Secondary | ICD-10-CM | POA: Diagnosis not present

## 2016-10-25 DIAGNOSIS — F4320 Adjustment disorder, unspecified: Secondary | ICD-10-CM | POA: Diagnosis not present

## 2016-11-01 DIAGNOSIS — G894 Chronic pain syndrome: Secondary | ICD-10-CM | POA: Diagnosis not present

## 2016-11-01 DIAGNOSIS — Z79891 Long term (current) use of opiate analgesic: Secondary | ICD-10-CM | POA: Diagnosis not present

## 2016-11-01 DIAGNOSIS — Z79899 Other long term (current) drug therapy: Secondary | ICD-10-CM | POA: Diagnosis not present

## 2016-11-01 DIAGNOSIS — M542 Cervicalgia: Secondary | ICD-10-CM | POA: Diagnosis not present

## 2016-11-16 DIAGNOSIS — F4320 Adjustment disorder, unspecified: Secondary | ICD-10-CM | POA: Diagnosis not present

## 2016-11-22 DIAGNOSIS — H5213 Myopia, bilateral: Secondary | ICD-10-CM | POA: Diagnosis not present

## 2016-11-22 DIAGNOSIS — F4320 Adjustment disorder, unspecified: Secondary | ICD-10-CM | POA: Diagnosis not present

## 2016-11-22 DIAGNOSIS — H52223 Regular astigmatism, bilateral: Secondary | ICD-10-CM | POA: Diagnosis not present

## 2016-11-29 ENCOUNTER — Encounter: Payer: Self-pay | Admitting: Family

## 2016-11-29 ENCOUNTER — Ambulatory Visit (INDEPENDENT_AMBULATORY_CARE_PROVIDER_SITE_OTHER)
Admission: RE | Admit: 2016-11-29 | Discharge: 2016-11-29 | Disposition: A | Discharge status: Home / Self Care | Payer: 59 | Source: Ambulatory Visit | Attending: Family | Admitting: Family

## 2016-11-29 ENCOUNTER — Ambulatory Visit (INDEPENDENT_AMBULATORY_CARE_PROVIDER_SITE_OTHER): Payer: 59 | Admitting: Family

## 2016-11-29 VITALS — BP 118/80 | HR 82 | Temp 98.6°F | Resp 16 | Ht 70.0 in | Wt 260.1 lb

## 2016-11-29 DIAGNOSIS — M659 Synovitis and tenosynovitis, unspecified: Secondary | ICD-10-CM | POA: Diagnosis not present

## 2016-11-29 DIAGNOSIS — F4320 Adjustment disorder, unspecified: Secondary | ICD-10-CM | POA: Diagnosis not present

## 2016-11-29 DIAGNOSIS — S6991XA Unspecified injury of right wrist, hand and finger(s), initial encounter: Secondary | ICD-10-CM | POA: Diagnosis not present

## 2016-11-29 DIAGNOSIS — M25531 Pain in right wrist: Secondary | ICD-10-CM | POA: Diagnosis not present

## 2016-11-29 MED ORDER — DICLOFENAC SODIUM 2 % TD SOLN: 1.0000 "application " | Freq: Two times a day (BID) | TRANSDERMAL | 1 refills | Status: DC | PRN

## 2016-11-29 MED ORDER — IBUPROFEN-FAMOTIDINE 800-26.6 MG PO TABS: 1.0000 | ORAL_TABLET | Freq: Three times a day (TID) | ORAL | 1 refills | Status: DC | PRN

## 2016-11-29 NOTE — Patient Instructions (Addendum)
Thank you for choosing [facility]!  Ice x 20 minutes every 2 hours and as needed or following activity  Brace when able x 2 weeks for protection.  Medications  Pennsaid - Approximately 1/2 packet to the affected site twice daily.  Duexis - 1 tablet 3 times per day for the next 5-7 days and then as needed.  You will receive a call from [facility] regarding your Pennsaid/Duexis/Vimovo. The medication will be mailed to you and should cost you no more than $10 per item or possibly free depending upon your insurance.   Your prescription(s) have been submitted to your pharmacy or been printed and provided for you. Please take as directed and contact our office if you believe you are having problem(s) with the medication(s) or have any questions.  If your symptoms worsen or fail to improve, please contact our office for further instruction, or in case of emergency go directly to the emergency room at the closest medical facility.   De Quervain Tenosynovitis Tendons attach muscles to bones. They also help with joint movements. When tendons become irritated or swollen, it is called tendinitis. The extensor pollicis brevis (EPB) tendon connects the EPB muscle to a bone that is near the base of the thumb. The EPB muscle helps to straighten and extend the thumb. De Quervain tenosynovitis is a condition in which the EPB tendon lining (sheath) becomes irritated, thickened, and swollen. This condition is sometimes called stenosing tenosynovitis. This condition causes pain on the thumb side of the back of the wrist. What are the causes? Causes of this condition include:  Activities that repeatedly cause your thumb and wrist to extend.  A sudden increase in activity or change in activity that affects your wrist.  What increases the risk? This condition is more likely to develop in:  Females.  People who have diabetes.  Women who have recently given birth.  People who are over 47  years of age.  People who do activities that involve repeated hand and wrist motions, such as tennis, racquetball, volleyball, gardening, and taking care of children.  People who do heavy labor.  People who have poor wrist strength and flexibility.  People who do not warm up properly before activities.  What are the signs or symptoms? Symptoms of this condition include:  Pain or tenderness over the thumb side of the back of the wrist when your thumb and wrist are not moving.  Pain that gets worse when you straighten your thumb or extend your thumb or wrist.  Pain when the injured area is touched.  Locking or catching of the thumb joint while you bend and straighten your thumb.  Decreased thumb motion due to pain.  Swelling over the affected area.  How is this diagnosed? This condition is diagnosed with a medical history and physical exam. Your health care provider will ask for details about your injury and ask about your symptoms. How is this treated? Treatment may include the use of icing and medicines to reduce pain and swelling. You may also be advised to wear a splint or brace to limit your thumb and wrist motion. In less severe cases, treatment may also include working with a physical therapist to strengthen your wrist and calm the irritation around your EPB tendon sheath. In severe cases, surgery may be needed. Follow these instructions at home: If you have a splint or brace:  Wear it as told by your health care provider. Remove it only as told by your health  care provider.  Loosen the splint or brace if your fingers become numb and tingle, or if they turn cold and blue.  Keep the splint or brace clean and dry. Managing pain, stiffness, and swelling  If directed, apply ice to the injured area. ? Put ice in a plastic bag. ? Place a towel between your skin and the bag. ? Leave the ice on for 20 minutes, 2-3 times per day.  Move your fingers often to avoid stiffness and  to lessen swelling.  Raise (elevate) the injured area above the level of your heart while you are sitting or lying down. General instructions  Return to your normal activities as told by your health care provider. Ask your health care provider what activities are safe for you.  Take over-the-counter and prescription medicines only as told by your health care provider.  Keep all follow-up visits as told by your health care provider. This is important.  Do not drive or operate heavy machinery while taking prescription pain medicine. Contact a health care provider if:  Your pain, tenderness, or swelling gets worse, even if you have had treatment.  You have numbness or tingling in your wrist, hand, or fingers on the injured side. This information is not intended to replace advice given to you by your health care provider. Make sure you discuss any questions you have with your health care provider. Document Released: 06/13/2005 Document Revised: 11/19/2015 Document Reviewed: 08/19/2014 Elsevier Interactive Patient Education  [employer]2018 [employer]vier Inc[employer]

## 2016-11-29 NOTE — Progress Notes (Signed)
Subjective:    Patient ID: Tara CrateShani J Ward, female    DOB: 03/28/1981, 36 y.o.   MRN: [id number]  Chief Complaint  Patient presents with  . Wrist Pain    right wrist pain, pain travels from wrist to thumb, x2 weeks    HPI:  Tara CrateShani J Cronin is a 36 y.o. female who  has a past medical history of Anxiety; Condyloma acuminata; Depression; Hidradenitis suppurativa; Peptic ulcer disease; and Pilonidal cyst. and presents today for an office visit.   This is a new problem. Associated symptom of pain located in her right wrist that has been going on for about 2 weeks and travels from her wrist to her thumb. Modifying factors include a cock up wrist splint. 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 Has also tried ice and heat which did not help very much.    Allergies  Allergen Reactions  . Doxycycline Swelling    SWELLING IN OPTIC NERVE.      Outpatient Medications Prior to Visit  Medication Sig Dispense Refill  . phenazopyridine (PYRIDIUM) 200 MG tablet Take 1 tablet (200 mg total) by mouth 3 (three) times daily as needed for pain. 20 tablet 1  . traMADol (ULTRAM) 50 MG tablet Take 50 mg by mouth 3 (three) times daily as needed for moderate pain.     . traMADol (ULTRAM-ER) 300 MG 24 hr tablet Take 300 mg by mouth every morning.     . ciprofloxacin (CIPRO) 500 MG tablet Take 1 tablet (500 mg total) by mouth 2 (two) times daily. 6 tablet 0  . omeprazole (PRILOSEC) 20 MG capsule Take 1 capsule (20 mg total) by mouth daily. 30 capsule 0   No facility-administered medications prior to visit.       Past Surgical History:  Procedure Laterality Date  . TUBAL LIGATION  2004      Past Medical History:  Diagnosis Date  . Anxiety   . Condyloma acuminata   . Depression   . Hidradenitis suppurativa    recurrent  . Peptic ulcer disease   . Pilonidal cyst       Review of Systems  Constitutional: Negative for chills and fever.    Musculoskeletal:       Positive for right thumb pain.  Neurological: Positive for weakness. Negative for numbness.      Objective:    BP 118/80 (BP Location: Left Arm, Patient Position: Sitting, Cuff Size: Large)   Pulse 82   Temp 98.6 F (37 C) (Oral)   Resp 16   Ht 5\' 10"  (1.778 m)   Wt 260 lb 1.9 oz (118 kg)   LMP 11/15/2016   SpO2 98%   BMI 37.32 kg/m  Nursing note and vital signs reviewed.  Physical Exam  Constitutional: She is oriented to person, place, and time. She appears well-developed and well-nourished. No distress.  Cardiovascular: Normal rate, regular rhythm, normal heart sounds and intact distal pulses.   Pulmonary/Chest: Effort normal and breath sounds normal.  Musculoskeletal:  Right wrist/thumb - no obvious deformity, discoloration, with mild edema. Palpable tenderness along extensor pollicis brevis tendon. Increased pain with some flexion and extension. Capillary refill and pulses intact and appropriate. Sensation intact and appropriate. Positive Finkelstein's test.  Neurological: She is alert and oriented to person, place, and time.  Skin: Skin is warm and dry.  Psychiatric: She has a normal mood and affect. Her behavior is normal. Judgment and thought  content normal.       Assessment & Plan:   Problem List Items Addressed This Visit      Musculoskeletal and Integument   Tenosynovitis of thumb - Primary    New onset some pain with no significant trauma with concern for tenosynovitis of the thumb and possible fracture. Obtain x-ray. Treat conservatively with ice, thumb spica brace and start Pennsaid and Duexis. Follow-up if symptoms worsen or do not improve.      Relevant Orders   DG Wrist Complete Right (Completed)       I have discontinued Ms. Kiesling's ciprofloxacin and omeprazole. I am also having her start on Ibuprofen-Famotidine and Diclofenac Sodium. Additionally, I am having her maintain her traMADol, traMADol, and phenazopyridine.   Meds  ordered this encounter  Medications  . Ibuprofen-Famotidine 800-26.6 MG TABS    Sig: Take 1 tablet by mouth 3 (three) times daily as needed.    Dispense:  90 tablet    Refill:  1    Order Specific Question:   Supervising Provider    Answer:   Hillard Danker A [[id number]]  . Diclofenac Sodium (PENNSAID) 2 % SOLN    Sig: Place 1 application onto the skin 2 (two) times daily as needed.    Dispense:  112 g    Refill:  1    Order Specific Question:   Supervising Provider    Answer:   Hillard Danker A [[id number]]     Follow-up: Return if symptoms worsen or fail to improve.  Jeanine Luz, FNP

## 2016-11-29 NOTE — Assessment & Plan Note (Signed)
New onset some pain with no significant trauma with concern for tenosynovitis of the thumb and possible fracture. Obtain x-ray. Treat conservatively with ice, thumb spica brace and start Pennsaid and Duexis. Follow-up if symptoms worsen or do not improve.

## 2016-12-06 DIAGNOSIS — F4320 Adjustment disorder, unspecified: Secondary | ICD-10-CM | POA: Diagnosis not present

## 2016-12-08 ENCOUNTER — Telehealth: Payer: Self-pay | Admitting: *Deleted

## 2016-12-08 NOTE — Telephone Encounter (Signed)
Rec'd call from [facility] dept stating they are needing clinical information faxed for rx Pennsaid that was rx on 11/29/16. Faxed notes to [phone]...Raechel Chute/lmb

## 2016-12-13 DIAGNOSIS — F4320 Adjustment disorder, unspecified: Secondary | ICD-10-CM | POA: Diagnosis not present

## 2016-12-20 DIAGNOSIS — F339 Major depressive disorder, recurrent, unspecified: Secondary | ICD-10-CM | POA: Diagnosis not present

## 2016-12-27 DIAGNOSIS — F339 Major depressive disorder, recurrent, unspecified: Secondary | ICD-10-CM | POA: Diagnosis not present

## 2017-01-03 DIAGNOSIS — F339 Major depressive disorder, recurrent, unspecified: Secondary | ICD-10-CM | POA: Diagnosis not present

## 2017-01-04 ENCOUNTER — Ambulatory Visit (INDEPENDENT_AMBULATORY_CARE_PROVIDER_SITE_OTHER): Payer: 59 | Admitting: Family

## 2017-01-04 ENCOUNTER — Encounter: Payer: Self-pay | Admitting: Family

## 2017-01-04 VITALS — BP 116/80 | HR 84 | Temp 98.3°F | Resp 16 | Ht 70.0 in | Wt 255.8 lb

## 2017-01-04 DIAGNOSIS — M65949 Unspecified synovitis and tenosynovitis, unspecified hand: Secondary | ICD-10-CM

## 2017-01-04 DIAGNOSIS — M659 Synovitis and tenosynovitis, unspecified: Secondary | ICD-10-CM | POA: Diagnosis not present

## 2017-01-04 NOTE — Patient Instructions (Signed)
Thank you for choosing [facility].  SUMMARY AND INSTRUCTIONS:  Please continue with the brace and icing.  Schedule to see Dr. Katrinka BlazingSmith for injection.  Follow up:  If your symptoms worsen or fail to improve, please contact our office for further instruction, or in case of emergency go directly to the emergency room at the closest medical facility.

## 2017-01-04 NOTE — Assessment & Plan Note (Signed)
Symptoms and exam remain consistent with tenosynovitis of the right thumb with mild improvements with conservative treatment. Recommend cortisone injection. Continue conservative treatment with icing and bracing. Follow up if symptoms worsen or do not improve.

## 2017-01-04 NOTE — Progress Notes (Signed)
Subjective:    Patient ID: Tara Ward, female    DOB: 01-Oct-1980, 36 y.o.   MRN: [id number]  Chief Complaint  Patient presents with  . Wrist Pain    still having issues with right wrist pain and needs new brace    HPI:  Tara Ward is a 36 y.o. female who  has a past medical history of Anxiety; Condyloma acuminata; Depression; Hidradenitis suppurativa; Peptic ulcer disease; and Pilonidal cyst. and presents today for a follow up office visit.   Continues to experience the associated symptom of pain located in her right wrist with concern for tenosynovitis of the right extensor tendon. Reports taking the medications as prescribed and using the brace. 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 Pennsaid gave her a rash. Wants to pursue the next steps.   Allergies  Allergen Reactions  . Doxycycline Swelling    SWELLING IN OPTIC NERVE.      Outpatient Medications Prior to Visit  Medication Sig Dispense Refill  . Diclofenac Sodium (PENNSAID) 2 % SOLN Place 1 application onto the skin 2 (two) times daily as needed. 112 g 1  . Ibuprofen-Famotidine 800-26.6 MG TABS Take 1 tablet by mouth 3 (three) times daily as needed. 90 tablet 1  . phenazopyridine (PYRIDIUM) 200 MG tablet Take 1 tablet (200 mg total) by mouth 3 (three) times daily as needed for pain. 20 tablet 1  . traMADol (ULTRAM) 50 MG tablet Take 50 mg by mouth 3 (three) times daily as needed for moderate pain.     . traMADol (ULTRAM-ER) 300 MG 24 hr tablet Take 300 mg by mouth every morning.      No facility-administered medications prior to visit.     Review of Systems  Constitutional: Negative for chills and fever.  Musculoskeletal:       Positive for thumb pain.   Neurological: Negative for weakness and numbness.      Objective:    BP 116/80 (BP Location: Left Arm, Patient Position: Sitting,  Cuff Size: Large)   Pulse 84   Temp 98.3 F (36.8 C) (Oral)   Resp 16   Ht 5\' 10"  (1.778 m)   Wt 255 lb 12.8 oz (116 kg)   SpO2 98%   BMI 36.70 kg/m  Nursing note and vital signs reviewed.  Physical Exam  Constitutional: She is oriented to person, place, and time. She appears well-developed and well-nourished. No distress.  Cardiovascular: Normal rate, regular rhythm, normal heart sounds and intact distal pulses.   Pulmonary/Chest: Effort normal and breath sounds normal.  Musculoskeletal:  Right thumb not obvious deformity, discoloration, or edema. Palpable tenderness along the extensor tendon exacerbated with some flexion. Positive Finkelstein's. Capillary refill pulses are intact and appropriate. -   Neurological: She is alert and oriented to person, place, and time.  Skin: Skin is warm and dry.  Psychiatric: She has a normal mood and affect. Her behavior is normal. Judgment and thought content normal.       Assessment & Plan:   Problem List Items Addressed This Visit      Musculoskeletal and Integument   Tenosynovitis of thumb - Primary    Symptoms and exam remain consistent with tenosynovitis of the right thumb with mild improvements with conservative treatment. Recommend cortisone injection. Continue conservative treatment with icing and bracing. Follow up if symptoms worsen or do not improve.  I am having Ms. Janik maintain her traMADol, traMADol, phenazopyridine, Ibuprofen-Famotidine, and Diclofenac Sodium.   Follow-up: Return if symptoms worsen or fail to improve.  Jeanine Luzalone, Icy Fuhrmann, FNP

## 2017-01-10 DIAGNOSIS — F339 Major depressive disorder, recurrent, unspecified: Secondary | ICD-10-CM | POA: Diagnosis not present

## 2017-01-11 NOTE — Progress Notes (Signed)
[name] [address] [place], [place] [address] Phone: [phone] Subjective:    I'm seeing this patient by the request  of:  Veryl Speak, FNP   CC: right thumb pain   BJY:NWGNFAOZHY  Tara Ward is a 36 y.o. female coming in with complaint of right thumb pain. Patient has had this pain for multiple weeks if not months. Has noticed significant increasing in discomfort and pain. Patient states doing the home exercises and was given by primary care provider with no significant improvement. Patient states that it is affecting her ability to work. Sometimes can wake her up at night. Denies any numbness or weakness but states sharp pain with certain movements.     Past Medical History:  Diagnosis Date  . Anxiety   . Condyloma acuminata   . Depression   . Hidradenitis suppurativa    recurrent  . Peptic ulcer disease   . Pilonidal cyst    Past Surgical History:  Procedure Laterality Date  . TUBAL LIGATION  2004   Social History   Social History  . Marital status: Married    Spouse name: N/A  . Number of children: 0  . Years of education: 15   Occupational History  . CNA, Med tech [employer]   Social History Main Topics  . Smoking status: Former Smoker    Packs/day: 1.00    Years: 15.00    Types: Cigarettes    Quit date: 07/12/2015  . Smokeless tobacco: Never Used  . Alcohol use 0.0 oz/week     Comment: socially   . Drug use: No  . Sexual activity: Not on file   Other Topics Concern  . Not on file   Social History Narrative   No regular excerise   Fun: Spend time with family.   Denies religious beliefs that would effect healthcare.    Allergies  Allergen Reactions  . Doxycycline Swelling    SWELLING IN OPTIC NERVE.   Family History  Problem Relation Age of Onset  . Hyperthyroidism Mother   . Diabetes Father   . Cancer Other        lung  . Diabetes Other        1st degree relative  . Cancer Paternal  Uncle        gastric    Past medical history, social, surgical and family history all reviewed in electronic medical record.  No pertanent information unless stated regarding to the chief complaint.   Review of Systems: No headache, visual changes, nausea, vomiting, diarrhea, constipation, dizziness, abdominal pain, skin rash, fevers, chills, night sweats, weight loss, swollen lymph nodes, body aches, joint swelling, muscle aches, chest pain, shortness of breath, mood changes.    Objective  There were no vitals taken for this visit. Systems examined below as of 01/11/17   General: No apparent distress alert and oriented x3 mood and affect normal, dressed appropriately.  HEENT: Pupils equal, extraocular movements intact  Respiratory: Patient's speak in full sentences and does not appear short of breath  Cardiovascular: No lower extremity edema, non tender, no erythema  Skin: Warm dry intact with no signs of infection or rash on extremities or on axial skeleton.  Abdomen: Soft nontender  Neuro: Cranial nerves II through XII are intact, neurovascularly intact in all extremities with 2+ DTRs and 2+ pulses.  Lymph: No lymphadenopathy of posterior or anterior cervical chain or axillae bilaterally.  Gait normal with good balance and coordination.  MSK:  Non tender with full range of motion and good stability and symmetric strength and tone of shoulders, elbows, hip, knee and ankles bilaterally.  Wrist:Right Inspection normal with no visible erythema or swelling. ROM smooth and normal with good flexion and extension and ulnar/radial deviation that is symmetrical with opposite wrist. Palpation is normal over metacarpals, navicular, lunate, and TFCC; tendons without tenderness/ swelling No snuffbox tenderness. No tenderness over Canal of Guyon. Strength 5/5 in all directions without pain. Positive Finkelstein, negative tinel's and phalens. Negative Watson's test.  MSK US performed of: Right  wrist This study was ordered, performed, and interpreted by Terrilee FilesZach Seth Higginbotham D.O.  Wrist: Significant hypoechoic changes within the abductor pollicis longus tendon sheath No effusion seen. Possible healing radial fracture with a hard callus formation in the area where there is the most hypoechoic changes within the tendon sheath.  IMPRESSION:  De Quervain's tenosynovitis  Procedure: Real-time Ultrasound Guided Injection of right Abductor pollicis longs tendon sheath Device: GE Logiq E  Ultrasound guided injection is preferred based studies that show increased duration, increased effect, greater accuracy, decreased procedural pain, increased response rate with ultrasound guided versus blind injection.  Verbal informed consent obtained.  Time-out conducted.  Noted no overlying erythema, induration, or other signs of local infection.  Skin prepped in a sterile fashion.  Local anesthesia: Topical Ethyl chloride.  With sterile technique and under real time ultrasound guidance:  tendon visualized.  23g 5/8 inch needle inserted distal to proximal approach into tendon sheath. Pictures taken  for needle placement. Patient did have injection of 0.5 cc of 0.5% Marcaine, and 0.5 cc of Kenalog 40 mg/dL. Completed without difficulty  Pain immediately resolved suggesting accurate placement of the medication.  Advised to call if fevers/chills, erythema, induration, drainage, or persistent bleeding.  Images permanently stored and available for review in the ultrasound unit.  Impression: Technically successful ultrasound guided injection.  Procedure note 97110; 15 minutes spent for Therapeutic exercises as stated in above notes.  This included exercises focusing on stretching, strengthening, with significant focus on eccentric aspects.  We reviewed the hand anatomy in [employer] and reviewed the components involved.  Eccentric and abduction exercises given focusing on the eccentric motion  If  continues to do poorly, may need to inject the 1st dorsal sheath  Proper technique shown and discussed handout in great detail with ATC.  All questions were discussed and answered.     Impression and Recommendations:     This case required medical decision making of moderate complexity.      Note: This dictation was prepared with Dragon dictation along with smaller phrase technology. Any transcriptional errors that result from this process are unintentional.

## 2017-01-12 ENCOUNTER — Encounter: Payer: Self-pay | Admitting: Emergency Medicine

## 2017-01-12 ENCOUNTER — Ambulatory Visit: Payer: Self-pay

## 2017-01-12 ENCOUNTER — Ambulatory Visit (INDEPENDENT_AMBULATORY_CARE_PROVIDER_SITE_OTHER): Payer: 59 | Admitting: Family Medicine

## 2017-01-12 ENCOUNTER — Encounter: Payer: Self-pay | Admitting: Family Medicine

## 2017-01-12 VITALS — BP 106/76 | HR 86 | Wt 259.0 lb

## 2017-01-12 DIAGNOSIS — M659 Synovitis and tenosynovitis, unspecified: Secondary | ICD-10-CM | POA: Diagnosis not present

## 2017-01-12 DIAGNOSIS — M25531 Pain in right wrist: Secondary | ICD-10-CM | POA: Diagnosis not present

## 2017-01-12 DIAGNOSIS — M65949 Unspecified synovitis and tenosynovitis, unspecified hand: Secondary | ICD-10-CM

## 2017-01-12 NOTE — Assessment & Plan Note (Signed)
Patient does have worsening symptoms. Continue the brace. Given injection today. Tolerated the procedure well. Discussed icing regimen, discussed home exercises. Patient will start increasing range of motion. Work with Event organiserathletic trainer to learn home exercises. Follow-up again in 4 weeks

## 2017-01-12 NOTE — Patient Instructions (Signed)
Good to see you  Ice 20 minutes 2 times daily. Usually after activity and before bed. Wear brace day and night for 1 week and then at night for 2 weeks.  Vitamin D 2000 IU daily  Exercises 3 times a week.  See me again in 3 weeks

## 2017-01-17 DIAGNOSIS — F339 Major depressive disorder, recurrent, unspecified: Secondary | ICD-10-CM | POA: Diagnosis not present

## 2017-01-23 ENCOUNTER — Encounter: Payer: Self-pay | Admitting: Family Medicine

## 2017-01-24 DIAGNOSIS — G894 Chronic pain syndrome: Secondary | ICD-10-CM | POA: Diagnosis not present

## 2017-01-24 DIAGNOSIS — F339 Major depressive disorder, recurrent, unspecified: Secondary | ICD-10-CM | POA: Diagnosis not present

## 2017-01-24 DIAGNOSIS — M542 Cervicalgia: Secondary | ICD-10-CM | POA: Diagnosis not present

## 2017-01-31 DIAGNOSIS — F4320 Adjustment disorder, unspecified: Secondary | ICD-10-CM | POA: Diagnosis not present

## 2017-02-03 ENCOUNTER — Ambulatory Visit (INDEPENDENT_AMBULATORY_CARE_PROVIDER_SITE_OTHER): Payer: 59 | Admitting: Family Medicine

## 2017-02-03 ENCOUNTER — Encounter: Payer: Self-pay | Admitting: *Deleted

## 2017-02-03 ENCOUNTER — Encounter: Payer: Self-pay | Admitting: Family Medicine

## 2017-02-03 DIAGNOSIS — M659 Synovitis and tenosynovitis, unspecified: Secondary | ICD-10-CM | POA: Diagnosis not present

## 2017-02-03 MED ORDER — MELOXICAM 7.5 MG PO TABS: 7.5000 mg | ORAL_TABLET | Freq: Every day | ORAL | 0 refills | Status: DC

## 2017-02-03 MED ORDER — VITAMIN D (ERGOCALCIFEROL) 1.25 MG (50000 UNIT) PO CAPS: 50000.0000 [IU] | ORAL_CAPSULE | ORAL | 0 refills | Status: DC

## 2017-02-03 NOTE — Patient Instructions (Addendum)
Good to see you  Meloxicam daily for 10 days then as needed but watch your stomach  OK to start lifting more.  Ice is your friend.  Once weekly vtiamain D for 12 weeks will help strength and if bone needs a little more help See me again in 4 weeks.

## 2017-02-03 NOTE — Progress Notes (Signed)
[facility] D[facility]. [facility] [address] [place], [place] [address] Phone: [phone] Subjective:    I'm seeing this patient by the request  of:  Veryl Speakalone, Gregory D, FNP   CC: right thumb pain f/u  BJY:NWGNFAOZHYHPI:Subjective  [name] is a 36 y.o. female coming in with complaint of right thumb pain. Patient stings and doing better. Impressively 50-60% better. Still concerned about going to full duty at work. Patient states that the injection did help out significantly and maybe some mild increasing in pain     Past Medical History:  Diagnosis Date  . Anxiety   . Condyloma acuminata   . Depression   . Hidradenitis suppurativa    recurrent  . Peptic ulcer disease   . Pilonidal cyst    Past Surgical History:  Procedure Laterality Date  . TUBAL LIGATION  2004   Social History   Social History  . Marital status: Married    Spouse name: N/A  . Number of children: 0  . Years of education: 15   Occupational History  . 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 Smoking status: Former Smoker    Packs/day: 1.00    Years: 15.00    Types: Cigarettes    Quit date: 07/12/2015  . Smokeless tobacco: Never Used  . Alcohol use 0.0 oz/week     Comment: socially   . Drug use: No  . Sexual activity: Not Asked   Other Topics Concern  . None   Social History Narrative   No regular excerise   Fun: Spend time with family.   Denies religious beliefs that would effect healthcare.    Allergies  Allergen Reactions  . Doxycycline Swelling    SWELLING IN OPTIC NERVE.   Family History  Problem Relation Age of Onset  . Hyperthyroidism Mother   . Diabetes Father   . Cancer Other        lung  . Diabetes Other        1st degree relative  . Cancer Paternal Uncle        gastric    Past medical history, social, surgical and family history all reviewed in electronic medical record.  No pertanent information unless stated regarding to the chief  complaint.   Review of Systems: No headache, visual changes, nausea, vomiting, diarrhea, constipation, dizziness, abdominal pain, skin rash, fevers, chills, night sweats, weight loss, swollen lymph nodes, body aches, joint swelling,  chest pain, shortness of breath, mood changes. Positive muscle aches    Objective  Blood pressure 114/80, pulse 79, height 5\' 10"  (1.778 m), weight 255 lb (115.7 kg), SpO2 99 %.   Systems examined below as of 02/03/17 General: NAD A&O x3 mood, affect normal  HEENT: Pupils equal, extraocular movements intact no nystagmus Respiratory: not short of breath at rest or with speaking Cardiovascular: No lower extremity edema, non tender Skin: Warm dry intact with no signs of infection or rash on extremities or on axial skeleton. Abdomen: Soft nontender, no masses Neuro: Cranial nerves  intact, neurovascularly intact in all extremities with 2+ DTRs and 2+ pulses. Lymph: No lymphadenopathy appreciated today  Gait normal with good balance and coordination.  MSK: Non tender with full range of motion and good stability and symmetric strength and tone of shoulders, elbows,  knee hips and ankles bilaterally.   Wrist:right Inspection normal with no visible erythema or swelling. ROM smooth and normal with good flexion and extension and ulnar/radial  deviation that is symmetrical with opposite wrist. Palpation is normal over metacarpals, navicular, lunate, and TFCC; tendons without tenderness/ swelling No snuffbox tenderness. No tenderness over Canal of Guyon. Strength 5/5 in all directions without pain. Mild positive Finkelstein's but improved Negative Watson's test. Contralateral wrist unremarkable    Impression and Recommendations:     This case required medical decision making of moderate complexity.      Note: This dictation was prepared with Dragon dictation along with smaller phrase technology. Any transcriptional errors that result from this process are  unintentional.

## 2017-02-03 NOTE — Assessment & Plan Note (Signed)
Improvement at this time. Possible low small callus could be that her. Started once weekly vitamin D. Patient has had difficulty with peptic ulcer disease up on a very low dose of meloxicam to see if this will be beneficial with patient being unable to do the topical anti-inflammatory. We discussed icing regimen. Continue home exercises. Patient will have restrictions upgraded to 25 pound lifting limit for 2 weeks and see me again in 4 weeks

## 2017-02-07 DIAGNOSIS — F4320 Adjustment disorder, unspecified: Secondary | ICD-10-CM | POA: Diagnosis not present

## 2017-02-14 DIAGNOSIS — F4320 Adjustment disorder, unspecified: Secondary | ICD-10-CM | POA: Diagnosis not present

## 2017-02-20 MED FILL — traMADol HCL ER 300 MG TB24: 300 | 30 days supply | Qty: 30 | Fill #0

## 2017-02-20 MED FILL — traMADol HCL 50 MG TABS: 50 | 30 days supply | Qty: 90 | Fill #0

## 2017-02-21 DIAGNOSIS — F4320 Adjustment disorder, unspecified: Secondary | ICD-10-CM | POA: Diagnosis not present

## 2017-03-01 DIAGNOSIS — F4320 Adjustment disorder, unspecified: Secondary | ICD-10-CM | POA: Diagnosis not present

## 2017-03-15 ENCOUNTER — Encounter: Payer: Self-pay | Admitting: Family

## 2017-03-15 DIAGNOSIS — F339 Major depressive disorder, recurrent, unspecified: Secondary | ICD-10-CM | POA: Diagnosis not present

## 2017-03-24 DIAGNOSIS — F339 Major depressive disorder, recurrent, unspecified: Secondary | ICD-10-CM | POA: Diagnosis not present

## 2017-03-29 MED FILL — traMADol HCL 50 MG TABS: 50 | 30 days supply | Qty: 90 | Fill #1

## 2017-03-29 MED FILL — traMADol HCL ER 300 MG TB24: 300 | 30 days supply | Qty: 30 | Fill #1

## 2017-03-30 DIAGNOSIS — F339 Major depressive disorder, recurrent, unspecified: Secondary | ICD-10-CM | POA: Diagnosis not present

## 2017-04-11 DIAGNOSIS — F339 Major depressive disorder, recurrent, unspecified: Secondary | ICD-10-CM | POA: Diagnosis not present

## 2017-04-24 DIAGNOSIS — F339 Major depressive disorder, recurrent, unspecified: Secondary | ICD-10-CM | POA: Diagnosis not present

## 2017-04-27 DIAGNOSIS — G894 Chronic pain syndrome: Secondary | ICD-10-CM | POA: Diagnosis not present

## 2017-04-27 DIAGNOSIS — L732 Hidradenitis suppurativa: Secondary | ICD-10-CM | POA: Diagnosis not present

## 2017-04-27 DIAGNOSIS — M542 Cervicalgia: Secondary | ICD-10-CM | POA: Diagnosis not present

## 2017-04-27 MED FILL — traMADol HCL 50 MG TABS: 50 | 30 days supply | Qty: 90 | Fill #0

## 2017-04-27 MED FILL — traMADol HCL ER 300 MG TB24: 300 | 30 days supply | Qty: 30 | Fill #0

## 2017-05-03 DIAGNOSIS — F4320 Adjustment disorder, unspecified: Secondary | ICD-10-CM | POA: Diagnosis not present

## 2017-05-09 NOTE — Telephone Encounter (Signed)
Pt called wanting the status of this form, states she dropped it off and gave it to the person closest to the door,  Please advise have you seen this.

## 2017-05-10 ENCOUNTER — Encounter: Payer: Self-pay | Admitting: Family

## 2017-05-10 DIAGNOSIS — F4320 Adjustment disorder, unspecified: Secondary | ICD-10-CM | POA: Diagnosis not present

## 2017-05-10 NOTE — Telephone Encounter (Signed)
Pt called to make an appointment to est care with new provider so she can get this form filled out,  She did not want to wait til Janurary for this from I spoke with Apolonio SchneidersAshleigh and if the information that is needed can be pulled from previous charts she will do it, the patient states the information is her vitals, weight and if she has any chronic medical issues,  I also made her an appointment with Ashleigh on 05/26/2018 She will faxing this form over in the next couple days because she has to get a new one

## 2017-05-12 DIAGNOSIS — Z0279 Encounter for issue of other medical certificate: Secondary | ICD-10-CM

## 2017-05-16 NOTE — Telephone Encounter (Signed)
Form has been completed and signed, faxed, original to patient, sent to scan &charged for. 11/20 bo

## 2017-05-25 DIAGNOSIS — F4320 Adjustment disorder, unspecified: Secondary | ICD-10-CM | POA: Diagnosis not present

## 2017-05-25 MED FILL — traMADol HCL 50 MG TABS: 50 | 30 days supply | Qty: 90 | Fill #1

## 2017-05-25 MED FILL — traMADol HCL ER 300 MG TB24: 300 | 30 days supply | Qty: 30 | Fill #1

## 2017-05-29 DIAGNOSIS — F339 Major depressive disorder, recurrent, unspecified: Secondary | ICD-10-CM | POA: Diagnosis not present

## 2017-06-15 DIAGNOSIS — F339 Major depressive disorder, recurrent, unspecified: Secondary | ICD-10-CM | POA: Diagnosis not present

## 2017-06-22 DIAGNOSIS — F339 Major depressive disorder, recurrent, unspecified: Secondary | ICD-10-CM | POA: Diagnosis not present

## 2017-06-28 MED FILL — traMADol HCL ER 300 MG TB24: 300 | 30 days supply | Qty: 30 | Fill #2

## 2017-06-30 DIAGNOSIS — F339 Major depressive disorder, recurrent, unspecified: Secondary | ICD-10-CM | POA: Diagnosis not present

## 2017-07-17 DIAGNOSIS — F339 Major depressive disorder, recurrent, unspecified: Secondary | ICD-10-CM | POA: Diagnosis not present

## 2017-07-20 DIAGNOSIS — G894 Chronic pain syndrome: Secondary | ICD-10-CM | POA: Diagnosis not present

## 2017-07-20 DIAGNOSIS — M542 Cervicalgia: Secondary | ICD-10-CM | POA: Diagnosis not present

## 2017-07-20 DIAGNOSIS — L732 Hidradenitis suppurativa: Secondary | ICD-10-CM | POA: Diagnosis not present

## 2017-07-20 MED FILL — traMADol HCL 50 MG TABS: 50 | 30 days supply | Qty: 60 | Fill #0

## 2017-07-26 ENCOUNTER — Encounter: Payer: Self-pay | Admitting: Nurse Practitioner

## 2017-07-26 ENCOUNTER — Ambulatory Visit: Payer: 59 | Admitting: Nurse Practitioner

## 2017-07-26 VITALS — BP 132/80 | HR 78 | Temp 98.8°F | Resp 16 | Ht 70.0 in | Wt 258.0 lb

## 2017-07-26 DIAGNOSIS — F339 Major depressive disorder, recurrent, unspecified: Secondary | ICD-10-CM | POA: Diagnosis not present

## 2017-07-26 DIAGNOSIS — R7309 Other abnormal glucose: Secondary | ICD-10-CM

## 2017-07-26 DIAGNOSIS — E6609 Other obesity due to excess calories: Secondary | ICD-10-CM

## 2017-07-26 DIAGNOSIS — R05 Cough: Secondary | ICD-10-CM

## 2017-07-26 DIAGNOSIS — Z6837 Body mass index (BMI) 37.0-37.9, adult: Secondary | ICD-10-CM | POA: Diagnosis not present

## 2017-07-26 DIAGNOSIS — L989 Disorder of the skin and subcutaneous tissue, unspecified: Secondary | ICD-10-CM

## 2017-07-26 DIAGNOSIS — Z13220 Encounter for screening for lipoid disorders: Secondary | ICD-10-CM | POA: Diagnosis not present

## 2017-07-26 DIAGNOSIS — R059 Cough, unspecified: Secondary | ICD-10-CM

## 2017-07-26 DIAGNOSIS — Z0001 Encounter for general adult medical examination with abnormal findings: Secondary | ICD-10-CM | POA: Diagnosis not present

## 2017-07-26 NOTE — Progress Notes (Signed)
Name: Tara Ward   MRN: [id number]    DOB: August 31, 1980   Date:07/26/2017       Progress Note  Subjective  Chief Complaint  Chief Complaint  Patient presents with  . Establish Care    CPE not fasting    HPI  Patient presents for annual CPE.  Diet: Breakfast-omelet, low sodium bacon; Lunch-grilled meat; Dinner-cooks at home-grilled meat/fish; vegetables; Drinks-water; no soda No fast food; no red meat- for current diet Exercise: once a week attends exercise class She has intentionally lost about 7 lbs through diet and exercise.  USPSTF grade A and B recommendations  Depression:  Depression screen [facility] 2/9 11/25/2015  Decreased Interest 0  Down, Depressed, Hopeless 0  PHQ - 2 Score 0  She does not feel anxious or depressed.  Hypertension: BP Readings from Last 3 Encounters:  07/26/17 132/80  02/03/17 114/80  01/12/17 106/76   Obesity: Wt Readings from Last 3 Encounters:  07/26/17 258 lb (117 kg)  02/03/17 255 lb (115.7 kg)  01/12/17 259 lb (117.5 kg)   BMI Readings from Last 3 Encounters:  07/26/17 37.02 kg/m  02/03/17 36.59 kg/m  01/12/17 37.16 kg/m    Alcohol: social- drinks about once a month Tobacco use: Current- she is not interested in quitting today HIV screening: declines STD testing and prevention: declines today  Vaccinations: up to date  Advanced Care Planning: A voluntary discussion about advance care planning including the explanation and discussion of advance directives.  Discussed health care proxy and Living will, and the patient DOES NOT have a living will at present time. If patient does have living will, I have requested they bring this to the clinic to be scanned in to their chart.  Cervical cancer screening: follows with GYN for womens health  Lipids:  Lab Results  Component Value Date   CHOL 141 11/25/2015   CHOL 129 08/21/2013   Lab Results  Component Value Date   HDL 38.60 (L) 11/25/2015   HDL 37.80 (L) 08/21/2013   Lab  Results  Component Value Date   LDLCALC 86 11/25/2015   LDLCALC 67 08/21/2013   Lab Results  Component Value Date   TRIG 81.0 11/25/2015   TRIG 122.0 08/21/2013   Lab Results  Component Value Date   CHOLHDL 4 11/25/2015   CHOLHDL 3 08/21/2013   No results found for: LDLDIRECT  Glucose:  Glucose, Bld  Date Value Ref Range Status  10/06/2016 98 65 - 99 mg/dL Final  40/98/1191 93 70 - 99 mg/dL Final  47/82/9562 87 70 - 99 mg/dL Final    Skin cancer: She requests referral to derm for annual skin check Aspirin: Not indicated ECG: Not indicated   Patient Active Problem List   Diagnosis Date Noted  . Tenosynovitis of thumb 11/29/2016  . Acute upper respiratory infection 01/08/2016  . Health care maintenance 08/21/2013  . Chronic upper back pain 07/31/2013  . Pilonidal cyst with abscess 05/11/2013  . Lichen simplex chronicus 11/09/2011  . Perforation of tympanic membrane, traumatic 06/09/2011  . Benign positional vertigo 02/10/2011  . Eustachian tube dysfunction 02/10/2011  . OBESITY 02/22/2010  . TOBACCO USE 02/22/2010  . DEPRESSION 02/22/2010  . PEPTIC ULCER DISEASE 02/22/2010  . HIDRADENITIS SUPPURATIVA 02/22/2010    Past Surgical History:  Procedure Laterality Date  . TUBAL LIGATION  2004    Family History  Problem Relation Age of Onset  . Hyperthyroidism Mother   . Diabetes Father   . Cancer Other  lung  . Diabetes Other        1st degree relative  . Cancer Paternal Uncle        gastric    Social History   Socioeconomic History  . Marital status: Married    Spouse name: Not on file  . Number of children: 0  . Years of education: 51  . Highest education level: Not on file  Social Needs  . Financial resource strain: Not on file  . Food insecurity - worry: Not on file  . Food insecurity - inability: Not on file  . Transportation needs - medical: Not on file  . Transportation needs - non-medical: Not on file  Occupational History  .  Occupation: Lawyer, Research officer, political party: [employer]  Tobacco Use  . Smoking status: Former Smoker    Packs/day: 1.00    Years: 15.00    Pack years: 15.00    Types: Cigarettes    Last attempt to quit: 07/12/2015    Years since quitting: 2.0  . Smokeless tobacco: Never Used  Substance and Sexual Activity  . Alcohol use: Yes    Alcohol/week: 0.0 oz    Comment: socially   . Drug use: No  . Sexual activity: Not on file  Other Topics Concern  . Not on file  Social History Narrative   No regular excerise   Fun: Spend time with family.   Denies religious beliefs that would effect healthcare.      Current Outpatient Medications:  .  traMADol (ULTRAM) 50 MG tablet, Take 50 mg by mouth 3 (three) times daily as needed for moderate pain. , Disp: , Rfl:  .  traMADol (ULTRAM-ER) 300 MG 24 hr tablet, Take 300 mg by mouth every morning. , Disp: , Rfl:   Allergies  Allergen Reactions  . Doxycycline Swelling    SWELLING IN OPTIC NERVE.     ROS  Constitutional: Negative for fever. Positive for weight loss. Respiratory: Negative for shortness of breath.  Positive for cough. Cardiovascular: Negative for chest pain or palpitations.  Gastrointestinal: Negative for abdominal pain, no bowel changes.  Musculoskeletal: Negative for gait problem or joint swelling.  Skin: Negative for rash.  Neurological: Negative for dizziness or headache.  No other specific complaints in a complete review of systems (except as listed in HPI above).  Cough-  This is an acute problem. She complains of cough, nasal congestion for the past week. She denies fevers, malaise, body aches. She overall feels well. She has not tried any over the counter medications for her symptoms.  Objective  Vitals:   07/26/17 0804  BP: 132/80  Pulse: 78  Resp: 16  Temp: 98.8 F (37.1 C)  TempSrc: Oral  SpO2: 98%  Weight: 258 lb (117 kg)  Height: 5\' 10"  (1.778 m)   Body mass index is 37.02 kg/m.  Physical  Exam- Constitutional: Patient appears well-developed and well-nourished. No distress.  HENT: Head: Normocephalic and atraumatic. Ears: Bilateral TMs bulging, without erythema or effusion; Nose: Nose normal. Mouth/Throat: Oropharynx is clear and moist. No oropharyngeal exudate.  Eyes: Conjunctivae and EOM are normal. Pupils are equal, round, and reactive to light. No scleral icterus.  Neck: Normal range of motion. Neck supple. No JVD present. No thyromegaly present.  Cardiovascular: Normal rate, regular rhythm and normal heart sounds.  No murmur heard. No BLE edema. Pulmonary/Chest: Effort normal and breath sounds normal. No respiratory distress. Abdominal: Soft. Bowel sounds are normal, no distension. There is no tenderness.  no masses Breast: deferred to GYN FEMALE GENITALIA: deferred to GYN RECTAL: deferred to GYN Musculoskeletal: Normal range of motion, no joint effusions. No gross deformities Neurological: She is alert and oriented to person, place, and time. No cranial nerve deficit. Coordination, balance, strength, speech and gait are normal.  Skin: Skin is warm and dry. No rash noted. No erythema.  Psychiatric: Patient has a normal mood and affect. Behavior is normal. Judgment and thought content normal.   Assessment & Plan RTC in 1 year for CPE  Elevated hemoglobin A1c - Comprehensive metabolic panel; Future - Lipid panel; Future - TSH; Future - Hemoglobin A1c; Future  Cough Discussed use of OTC medications and home care of URI. -Red flags and when to present for emergency care or RTC including fever >101.35F, chest pain, shortness of breath, new/worsening/un-resolving symptoms, reviewed with patient at time of visit. Follow up and care instructions discussed and provided in AVS.

## 2017-07-26 NOTE — Assessment & Plan Note (Signed)
-  USPSTF grade A and B recommendations reviewed with patient; age-appropriate recommendations, preventive care, screening tests, etc discussed and encouraged; healthy living encouraged; see AVS for patient education given to patient -Discussed importance of 150 minutes of physical activity weekly, eat two servings of fish weekly, eat one serving of tree nuts ( cashews, pistachios, pecans, almonds.Marland Kitchen.) every other day, eat 6 servings of fruit/vegetables daily and drink plenty of water and avoid sweet beverages.  -Red flags and when to present for emergency care or RTC including fever >101.74F, chest pain, shortness of breath, new/worsening/un-resolving symptoms, reviewed with patient at time of visit. Follow up and care instructions discussed and provided in AVS. -Reviewed Health Maintenance: Declines HIV screening; otherwise up to date - Lipid panel; Future-Screening for cholesterol level - CBC with Differential/Platelet; Future; Hemoglobin A1c; Future-Class 2 obesity due to excess calories without serious comorbidity with body mass index (BMI) of 37.0 to 37.9 in adult - Ambulatory referral to Dermatology-Skin problem-annual skin check

## 2017-07-26 NOTE — Patient Instructions (Signed)
Please return to the lab downstairs for labwork when fasting- only water or black coffee for 8 hours prior.  Keep up the good work on healthy diet and exercise! Remember half of your plate should be veggies, one-fourth carbs, one-fourth meat, and don't eat meat at every meal. Also, remember to stay away from sugary drinks. I'd like for you to start incorporating exercise into your daily schedule. Start at 10 minutes a day, working up to 30 minutes five times a week.   For nasal and head congestion may take Sudafed PE 10 mg every 4 hour as needed. Saline nasal spray used frequently. For drainage may use Allegra, Claritin or Zyrtec. If you need stronger medicine to stop drainage may take Chlor-Trimeton 2-4 mg every 4 hours. This may cause drowsiness. Ibuprofen 600 mg every 6 hours as needed for pain, discomfort or fever. May try mucinex DM for your cough. Drink plenty of fluids and stay well-hydrated.  It was so nice to meet you. Thanks for letting me take care of you today :)  Upper Respiratory Infection, Adult Most upper respiratory infections (URIs) are caused by a virus. A URI affects the nose, throat, and upper air passages. The most common type of URI is often called "the common cold." Follow these instructions at home:  Take medicines only as told by your doctor.  Gargle warm saltwater or take cough drops to comfort your throat as told by your doctor.  Use a warm mist humidifier or inhale steam from a shower to increase air moisture. This may make it easier to breathe.  Drink enough fluid to keep your pee (urine) clear or pale yellow.  Eat soups and other clear broths.  Have a healthy diet.  Rest as needed.  Go back to work when your fever is gone or your doctor says it is okay. ? You may need to stay home longer to avoid giving your URI to others. ? You can also wear a face mask and wash your hands often to prevent spread of the virus.  Use your inhaler more if you have  asthma.  Do not use any tobacco products, including cigarettes, chewing tobacco, or electronic cigarettes. If you need help quitting, ask your doctor. Contact a doctor if:  You are getting worse, not better.  Your symptoms are not helped by medicine.  You have chills.  You are getting more short of breath.  You have brown or red mucus.  You have yellow or brown discharge from your nose.  You have pain in your face, especially when you bend forward.  You have a fever.  You have puffy (swollen) neck glands.  You have pain while swallowing.  You have white areas in the back of your throat. Get help right away if:  You have very bad or constant: ? Headache. ? Ear pain. ? Pain in your forehead, behind your eyes, and over your cheekbones (sinus pain). ? Chest pain.  You have long-lasting (chronic) lung disease and any of the following: ? Wheezing. ? Long-lasting cough. ? Coughing up blood. ? A change in your usual mucus.  You have a stiff neck.  You have changes in your: ? Vision. ? Hearing. ? Thinking. ? Mood. This information is not intended to replace advice given to you by your health care provider. Make sure you discuss any questions you have with your health care provider. Document Released: 11/30/2007 Document Revised: 02/14/2016 Document Reviewed: 09/18/2013 Elsevier Interactive Patient Education  2018 [employer].  Preventive Care 18-39 Years, Female Preventive care refers to lifestyle choices and visits with your health care provider that can promote health and wellness. What does preventive care include?  A yearly physical exam. This is also called an annual well check.  Dental exams once or twice a year.  Routine eye exams. Ask your health care provider how often you should have your eyes checked.  Personal lifestyle choices, including: ? Daily care of your teeth and gums. ? Regular physical activity. ? Eating a healthy diet. ? Avoiding  tobacco and drug use. ? Limiting alcohol use. ? Practicing safe sex. ? Taking vitamin and mineral supplements as recommended by your health care provider. What happens during an annual well check? The services and screenings done by your health care provider during your annual well check will depend on your age, overall health, lifestyle risk factors, and family history of disease. Counseling Your health care provider may ask you questions about your:  Alcohol use.  Tobacco use.  Drug use.  Emotional well-being.  Home and relationship well-being.  Sexual activity.  Eating habits.  Work and work Statistician.  Method of birth control.  Menstrual cycle.  Pregnancy history.  Screening You may have the following tests or measurements:  Height, weight, and BMI.  Diabetes screening. This is done by checking your blood sugar (glucose) after you have not eaten for a while (fasting).  Blood pressure.  Lipid and cholesterol levels. These may be checked every 5 years starting at age 29.  Skin check.  Hepatitis C blood test.  Hepatitis B blood test.  Sexually transmitted disease (STD) testing.  BRCA-related cancer screening. This may be done if you have a family history of breast, ovarian, tubal, or peritoneal cancers.  Pelvic exam and Pap test. This may be done every 3 years starting at age 26. Starting at age 42, this may be done every 5 years if you have a Pap test in combination with an HPV test.  Discuss your test results, treatment options, and if necessary, the need for more tests with your health care provider. Vaccines Your health care provider may recommend certain vaccines, such as:  Influenza vaccine. This is recommended every year.  Tetanus, diphtheria, and acellular pertussis (Tdap, Td) vaccine. You may need a Td booster every 10 years.  Varicella vaccine. You may need this if you have not been vaccinated.  HPV vaccine. If you are 80 or younger, you  may need three doses over 6 months.  Measles, mumps, and rubella (MMR) vaccine. You may need at least one dose of MMR. You may also need a second dose.  Pneumococcal 13-valent conjugate (PCV13) vaccine. You may need this if you have certain conditions and were not previously vaccinated.  Pneumococcal polysaccharide (PPSV23) vaccine. You may need one or two doses if you smoke cigarettes or if you have certain conditions.  Meningococcal vaccine. One dose is recommended if you are age 60-21 years and a first-year college student living in a residence hall, or if you have one of several medical conditions. You may also need additional booster doses.  Hepatitis A vaccine. You may need this if you have certain conditions or if you travel or work in places where you may be exposed to hepatitis A.  Hepatitis B vaccine. You may need this if you have certain conditions or if you travel or work in places where you may be exposed to hepatitis B.  Haemophilus influenzae type b (Hib) vaccine. You may need this  if you have certain risk factors.  Talk to your health care provider about which screenings and vaccines you need and how often you need them. This information is not intended to replace advice given to you by your health care provider. Make sure you discuss any questions you have with your health care provider. Document Released: 08/09/2001 Document Revised: 03/02/2016 Document Reviewed: 04/14/2015 Elsevier Interactive Patient Education  [employer].

## 2017-07-28 MED FILL — traMADol HCL ER 300 MG TB24: 300 | 30 days supply | Qty: 30 | Fill #0

## 2017-08-03 DIAGNOSIS — F339 Major depressive disorder, recurrent, unspecified: Secondary | ICD-10-CM | POA: Diagnosis not present

## 2017-08-10 DIAGNOSIS — F339 Major depressive disorder, recurrent, unspecified: Secondary | ICD-10-CM | POA: Diagnosis not present

## 2017-08-22 ENCOUNTER — Encounter: Payer: Self-pay | Admitting: Family Medicine

## 2017-08-22 ENCOUNTER — Ambulatory Visit: Payer: 59 | Admitting: Family Medicine

## 2017-08-22 VITALS — BP 128/74 | HR 74 | Temp 98.3°F | Ht 70.0 in | Wt 250.0 lb

## 2017-08-22 DIAGNOSIS — J069 Acute upper respiratory infection, unspecified: Secondary | ICD-10-CM | POA: Diagnosis not present

## 2017-08-22 MED ORDER — CETIRIZINE-PSEUDOEPHEDRINE ER 5-120 MG PO TB12: 1.0000 | ORAL_TABLET | Freq: Two times a day (BID) | ORAL | 0 refills | Status: DC

## 2017-08-22 MED FILL — SM ALL DAY ALLERGY-D TABLET: 5-120 | 12 days supply | Qty: 24 | Fill #0

## 2017-08-22 NOTE — Assessment & Plan Note (Signed)
Symptoms likely viral in nature - Counseled on supportive care - Given indications to follow-up 

## 2017-08-22 NOTE — Patient Instructions (Signed)
Please try things such as zyrtec-D or allegra-D which is an antihistamine and decongestant.   Please try afrin which will help with nasal congestion but use for only three days.   Please also try using a netti pot on a regular occasion.  Honey can help with a sore throat.   

## 2017-08-22 NOTE — Progress Notes (Signed)
Tara Ward - 37 y.o. female MRN [id number]  Date of birth: [birth year]  SUBJECTIVE:  Including CC & ROS.  Chief Complaint  Patient presents with  . Flu Vaccine  . Cough    Tara Ward is a 37 y.o. female that is presenting with cough and sinus pressure. Ongoing for two days. Admits to body aches and fevers. She has been producing mucous. She has received her flu shot. She has been taking tylenol cold over the counter medication with no improvement. She has been around coworkers with similar symptoms.   Review of Systems  Constitutional: Positive for activity change. Negative for fever.  HENT: Positive for postnasal drip and sinus pain.   Respiratory: Positive for cough.   Cardiovascular: Negative for chest pain.  Gastrointestinal: Negative for abdominal pain.  Musculoskeletal: Negative for back pain.    HISTORY: Past Medical, Surgical, Social, and Family History Reviewed & Updated per EMR.   Pertinent Historical Findings include:  Past Medical History:  Diagnosis Date  . Anxiety   . Condyloma acuminata   . Depression   . Hidradenitis suppurativa    recurrent  . Peptic ulcer disease   . Pilonidal cyst     Past Surgical History:  Procedure Laterality Date  . TUBAL LIGATION  2004    Allergies  Allergen Reactions  . Doxycycline Swelling    SWELLING IN OPTIC NERVE.    Family History  Problem Relation Age of Onset  . Hyperthyroidism Mother   . Diabetes Father   . Cancer Other        lung  . Diabetes Other        1st degree relative  . Cancer Paternal Uncle        gastric     Social History   Socioeconomic History  . Marital status: Married    Spouse name: Not on file  . Number of children: 0  . Years of education: 1615  . Highest education level: Not on file  Social Needs  . Financial resource strain: Not on file  . Food insecurity - worry: Not on file  . Food insecurity - inability: Not on file  . Transportation needs - medical: Not on file  .  Transportation needs - non-medical: Not on file  Occupational History  . Occupation: LawyerCNA, Research officer, political partyMed tech    Employer: [employer]  Tobacco Use  . Smoking status: Former Smoker    Packs/day: 1.00    Years: 15.00    Pack years: 15.00    Types: Cigarettes    Last attempt to quit: 07/12/2015    Years since quitting: 2.1  . Smokeless tobacco: Never Used  Substance and Sexual Activity  . Alcohol use: Yes    Alcohol/week: 0.0 oz    Comment: socially   . Drug use: No  . Sexual activity: Not on file  Other Topics Concern  . Not on file  Social History Narrative   No regular excerise   Fun: Spend time with family.   Denies religious beliefs that would effect healthcare.      PHYSICAL EXAM:  VS: BP 128/74 (BP Location: Left Arm, Patient Position: Sitting, Cuff Size: Normal)   Pulse 74   Temp 98.3 F (36.8 C) (Oral)   Ht 5\' 10"  (1.778 m)   Wt 250 lb (113.4 kg)   SpO2 97%   BMI 35.87 kg/m  Physical Exam Gen: NAD, alert, cooperative with exam,  ENT: normal lips, normal nasal mucosa, tympanic membranes clear and  intact bilaterally, normal oropharynx, no cervical lymphadenopathy Eye: normal EOM, normal conjunctiva and lids CV:  no edema, +2 pedal pulses, regular rate and rhythm, S1-S2   Resp: no accessory muscle use, non-labored, clear to auscultation bilaterally, no crackles or wheezes  Skin: no rashes, no areas of induration  Neuro: normal tone, normal sensation to touch Psych:  normal insight, alert and oriented MSK: Normal gait, normal strength       ASSESSMENT & PLAN:   Upper respiratory tract infection Symptoms likely viral in nature - Counseled on supportive care - Given indications to follow-up

## 2017-08-23 DIAGNOSIS — F339 Major depressive disorder, recurrent, unspecified: Secondary | ICD-10-CM | POA: Diagnosis not present

## 2017-08-24 MED FILL — traMADol HCL 50 MG TABS: 50 | 30 days supply | Qty: 60 | Fill #1

## 2017-08-25 MED FILL — traMADol HCL ER 300 MG TB24: 300 | 30 days supply | Qty: 30 | Fill #1

## 2017-09-02 DIAGNOSIS — F339 Major depressive disorder, recurrent, unspecified: Secondary | ICD-10-CM | POA: Diagnosis not present

## 2017-09-16 DIAGNOSIS — F339 Major depressive disorder, recurrent, unspecified: Secondary | ICD-10-CM | POA: Diagnosis not present

## 2017-09-22 MED FILL — traMADol HCL ER 300 MG TB24: 300 | 30 days supply | Qty: 30 | Fill #2

## 2017-09-22 MED FILL — traMADol HCL 50 MG TABS: 50 | 30 days supply | Qty: 60 | Fill #2

## 2017-10-05 DIAGNOSIS — F339 Major depressive disorder, recurrent, unspecified: Secondary | ICD-10-CM | POA: Diagnosis not present

## 2017-10-20 MED FILL — traMADol HCL 50 MG TABS: 50 | 30 days supply | Qty: 60 | Fill #0

## 2017-10-20 MED FILL — traMADol HCL ER 300 MG TB24: 300 | 30 days supply | Qty: 30 | Fill #0

## 2017-11-14 IMAGING — CR DG CERVICAL SPINE COMPLETE 4+V
7 series · 7 of 7 positions shown · non-contrast
Comparison: None.

CLINICAL DATA: Neck pain

EXAM:
CERVICAL SPINE - COMPLETE 4+ VIEW

[c-spine lat]
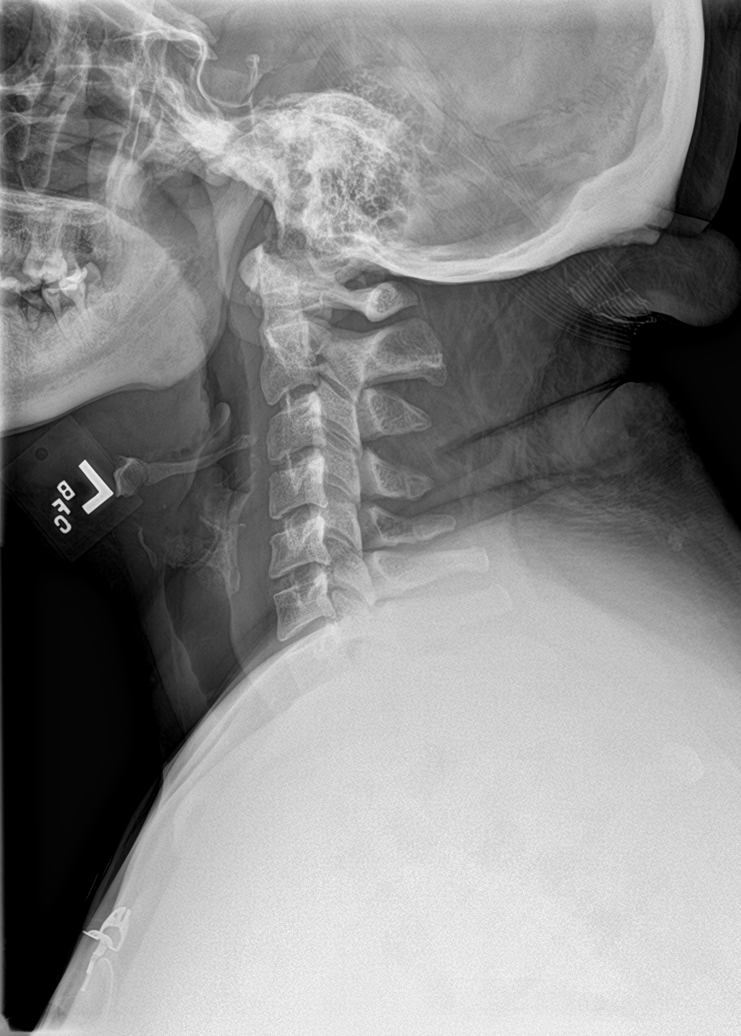

[c-spine obl (1 of 2)]
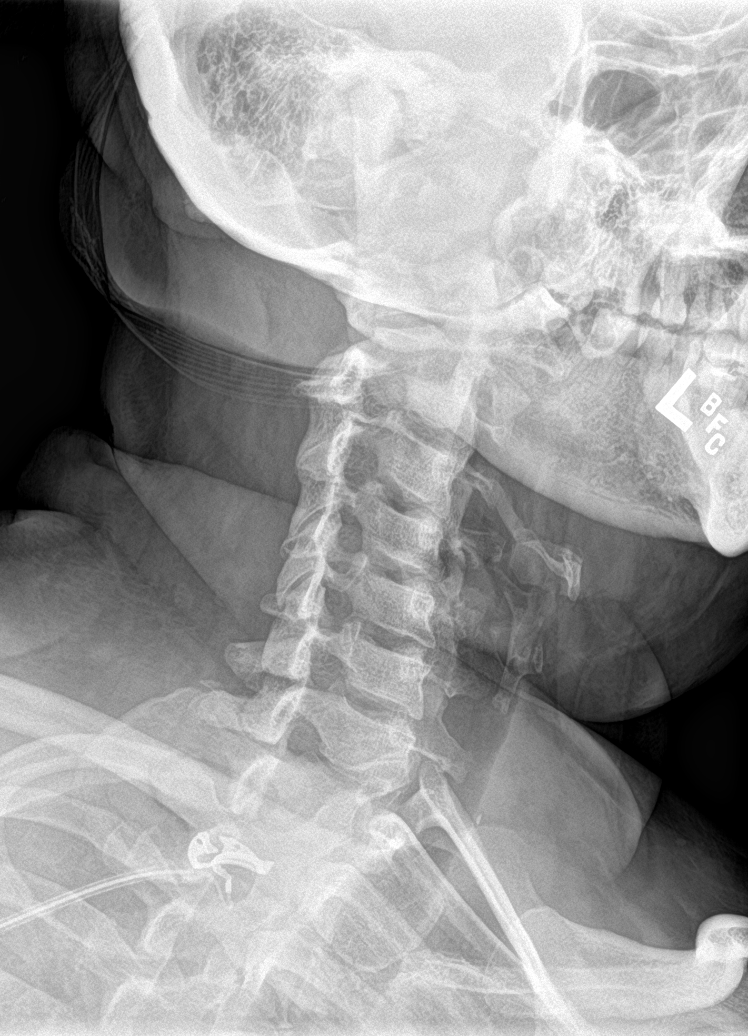

[c-spine obl (2 of 2)]
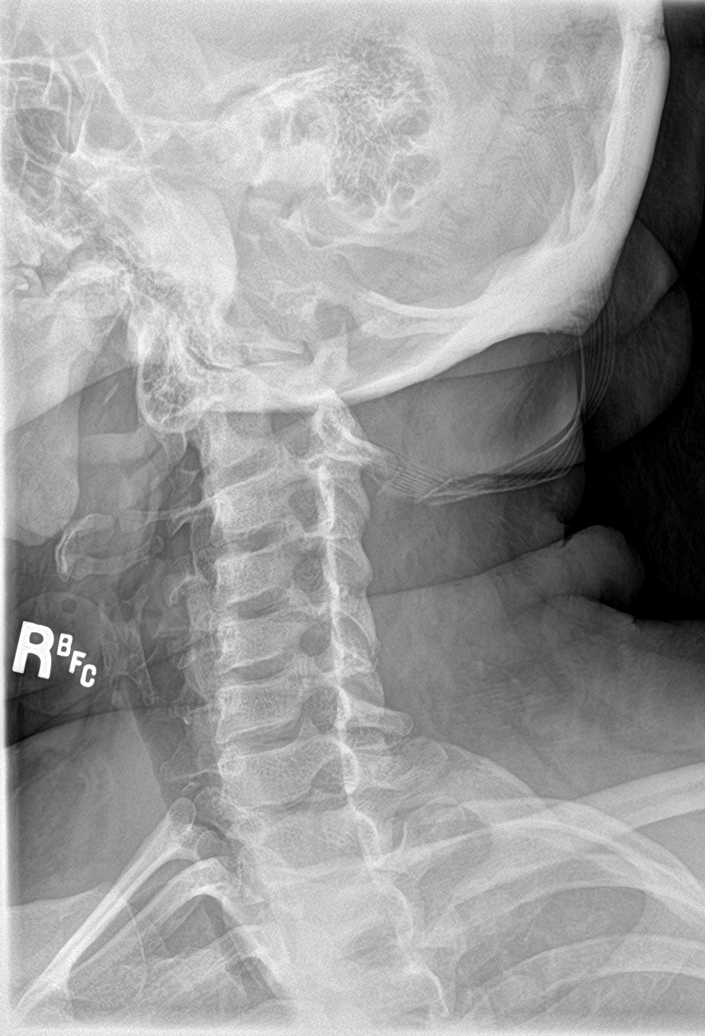

[c-spine ap]
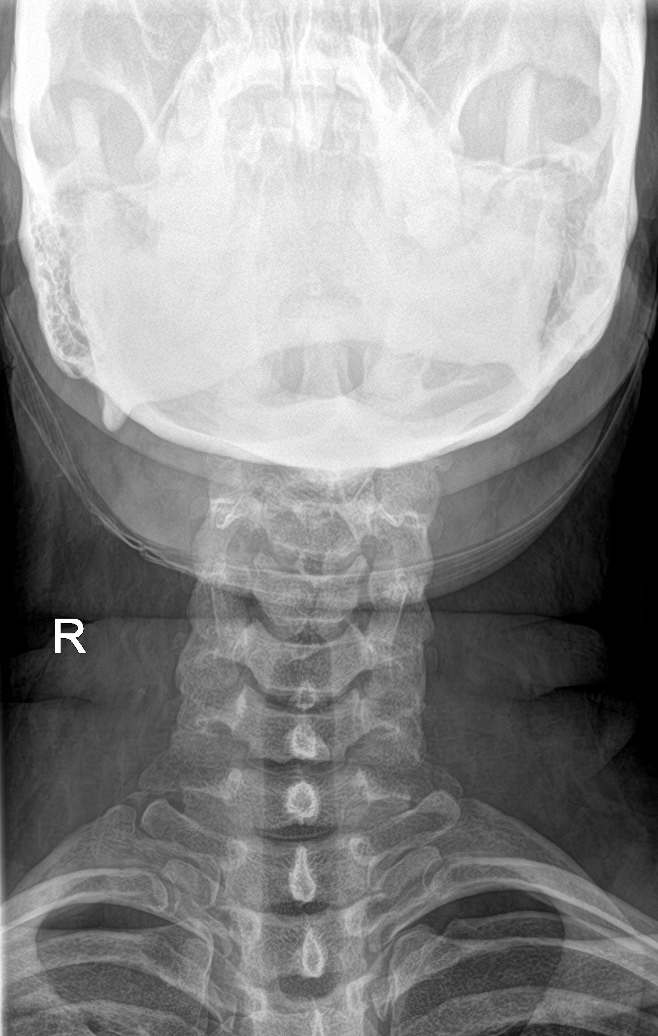

[c-spine open mouth (1 of 2)]
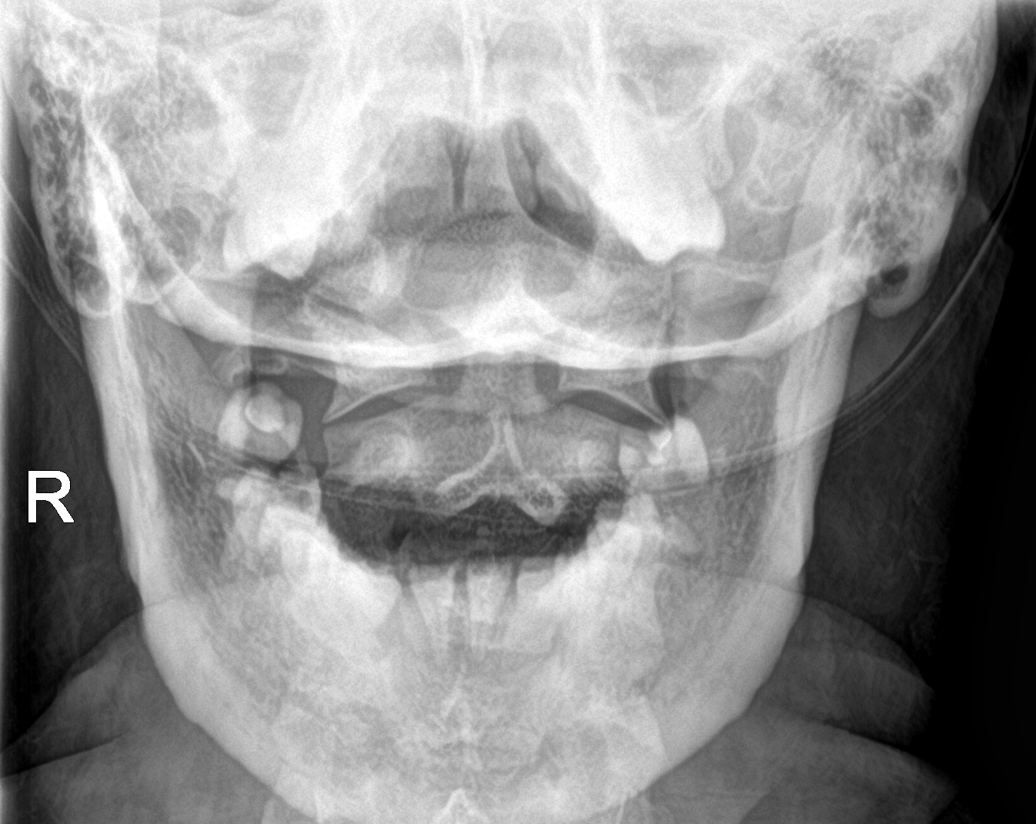

[[person_name]]
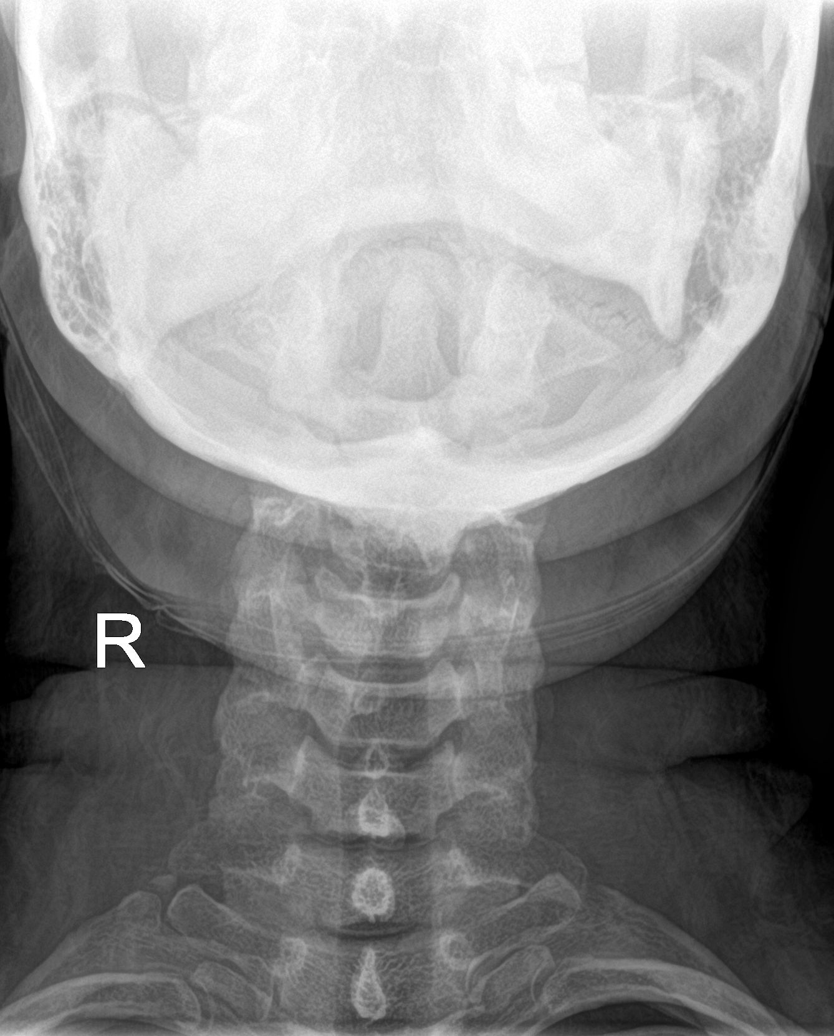

[c-spine open mouth (2 of 2)]
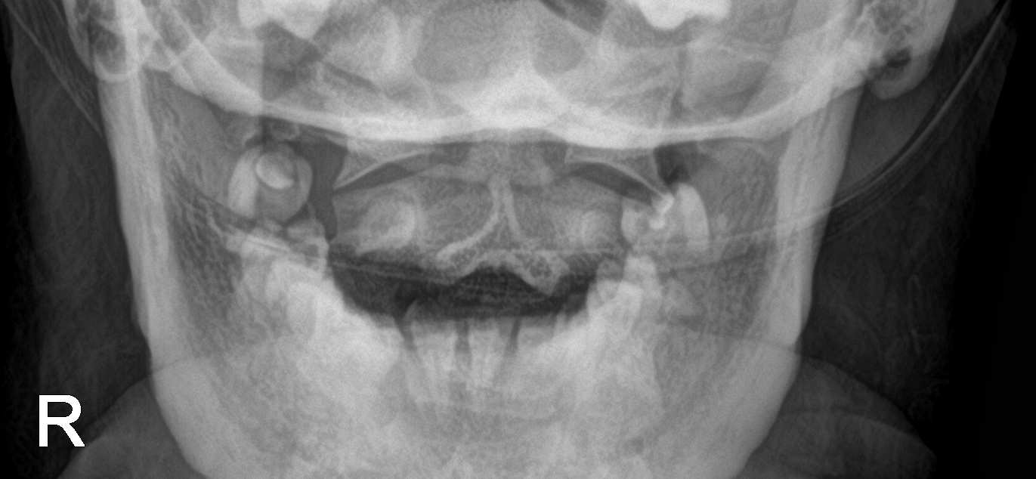

[7 of 7 positions shown; findings below may reference images not displayed]

FINDINGS: There is no evidence of cervical spine fracture or prevertebral soft
tissue swelling. Alignment is normal. No jumped appearing facets.
The C1-C2 relationship and atlantodental interval are maintained.
The craniocervical relationship appears intact. No significant
neural foraminal encroachment.
IMPRESSION: Negative cervical spine radiographs.

## 2017-11-24 MED FILL — traMADol HCL ER 300 MG TB24: 300 | 30 days supply | Qty: 30 | Fill #1

## 2017-12-25 MED FILL — traMADol HCL ER 300 MG TB24: 300 | 30 days supply | Qty: 30 | Fill #2

## 2018-01-16 MED FILL — traMADol HCL 50 MG TABS: 50 | 30 days supply | Qty: 60 | Fill #0

## 2018-01-24 MED FILL — traMADol HCL ER 300 MG TB24: 300 | 30 days supply | Qty: 30 | Fill #0 | Status: TO

## 2018-02-23 MED FILL — traMADol HCL 50 MG TABS: 50 | 30 days supply | Qty: 60 | Fill #1

## 2018-03-23 MED FILL — traMADol HCL 50 MG TABS: 50 | 30 days supply | Qty: 60 | Fill #2

## 2018-03-30 DIAGNOSIS — F339 Major depressive disorder, recurrent, unspecified: Secondary | ICD-10-CM | POA: Diagnosis not present

## 2018-04-04 DIAGNOSIS — F339 Major depressive disorder, recurrent, unspecified: Secondary | ICD-10-CM | POA: Diagnosis not present

## 2018-07-03 ENCOUNTER — Telehealth: Payer: Self-pay | Admitting: *Deleted

## 2018-07-03 DIAGNOSIS — M549 Dorsalgia, unspecified: Principal | ICD-10-CM

## 2018-07-03 DIAGNOSIS — G8929 Other chronic pain: Secondary | ICD-10-CM

## 2018-07-03 NOTE — Telephone Encounter (Signed)
Patient is requesting referral to [facility]. She states [facility] is no longer taking her insurance.

## 2018-07-04 NOTE — Telephone Encounter (Signed)
Referral has been placed. 

## 2018-07-05 ENCOUNTER — Encounter (HOSPITAL_COMMUNITY): Payer: Self-pay

## 2018-07-05 ENCOUNTER — Emergency Department (HOSPITAL_COMMUNITY): Payer: BLUE CROSS/BLUE SHIELD

## 2018-07-05 ENCOUNTER — Emergency Department (HOSPITAL_COMMUNITY)
Admission: EM | Admit: 2018-07-05 | Discharge: 2018-07-05 | Disposition: A | Discharge status: Home / Self Care | Payer: BLUE CROSS/BLUE SHIELD | Attending: Emergency Medicine | Admitting: Emergency Medicine

## 2018-07-05 ENCOUNTER — Other Ambulatory Visit: Payer: Self-pay

## 2018-07-05 DIAGNOSIS — F329 Major depressive disorder, single episode, unspecified: Secondary | ICD-10-CM | POA: Insufficient documentation

## 2018-07-05 DIAGNOSIS — F419 Anxiety disorder, unspecified: Secondary | ICD-10-CM | POA: Insufficient documentation

## 2018-07-05 DIAGNOSIS — R079 Chest pain, unspecified: Secondary | ICD-10-CM | POA: Insufficient documentation

## 2018-07-05 DIAGNOSIS — Z79899 Other long term (current) drug therapy: Secondary | ICD-10-CM | POA: Diagnosis not present

## 2018-07-05 DIAGNOSIS — F17210 Nicotine dependence, cigarettes, uncomplicated: Secondary | ICD-10-CM | POA: Diagnosis not present

## 2018-07-05 LAB — POCT I-STAT TROPONIN I
Troponin i, poc: 0 ng/mL (ref 0.00–0.08)
Troponin i, poc: 0 ng/mL (ref 0.00–0.08)

## 2018-07-05 LAB — BASIC METABOLIC PANEL
Anion gap: 8 (ref 5–15)
BUN: 11 mg/dL (ref 6–20)
CO2: 24 mmol/L (ref 22–32)
Calcium: 9.1 mg/dL (ref 8.9–10.3)
Chloride: 107 mmol/L (ref 98–111)
Creatinine, Ser: 0.63 mg/dL (ref 0.44–1.00)
GFR calc Af Amer: >60 mL/min (ref >60)
GFR calc non Af Amer: >60 mL/min (ref >60)
Glucose, Bld: 94 mg/dL (ref 70–99)
Potassium: 3.7 mmol/L (ref 3.5–5.1)
Sodium: 139 mmol/L (ref 135–145)

## 2018-07-05 LAB — CBC
HCT: 41.5 % (ref 36.0–46.0)
Hemoglobin: 12.6 g/dL (ref 12.0–15.0)
MCH: 26.8 pg (ref 26.0–34.0)
MCHC: 30.4 g/dL (ref 30.0–36.0)
MCV: 88.1 fL (ref 80.0–100.0)
Platelets: 362 10*3/uL (ref 150–400)
RBC: 4.71 MIL/uL (ref 3.87–5.11)
RDW: 14.1 % (ref 11.5–15.5)
WBC: 10.1 10*3/uL (ref 4.0–10.5)
nRBC: 0 % (ref 0.0–0.2)

## 2018-07-05 LAB — I-STAT BETA HCG BLOOD, ED (NOT ORDERABLE): I-stat hCG, quantitative: <5 m[IU]/mL (ref <5)

## 2018-07-05 MED ORDER — IBUPROFEN 600 MG PO TABS: 600.0000 mg | ORAL_TABLET | Freq: Three times a day (TID) | ORAL | 0 refills | Status: AC | PRN

## 2018-07-05 NOTE — Discharge Instructions (Addendum)
Take the medications as needed for pain, follow-up with your doctor next week to be rechecked, return as needed for worsening symptoms

## 2018-07-05 NOTE — ED Provider Notes (Signed)
[facility] Provider Note   CSN: [id number] Arrival date & time: 07/05/18  1750     History   Chief Complaint Chief Complaint  Patient presents with  . Chest Pain  . Arm Pain    HPI Tara Ward is a 38 y.o. female.  HPI Pt has been ongoing for several days.  It has been pretty much constant but waxes and wanes.   THe pain is in the left chest and goes to the arm.  She has some tingling in her hands. NO shortness of breath.  She had a resp illness a couple of weeks ago and still has a mild cough but nothing significant.    No hx of heart or lung disease.  No family history.  Past Medical History:  Diagnosis Date  . Anxiety   . Condyloma acuminata   . Depression   . Hidradenitis suppurativa    recurrent  . Peptic ulcer disease   . Pilonidal cyst     Patient Active Problem List   Diagnosis Date Noted  . Upper respiratory tract infection 08/22/2017  . Encounter for general adult medical examination with abnormal findings 08/21/2013  . Chronic upper back pain 07/31/2013  . Pilonidal cyst with abscess 05/11/2013  . Lichen simplex chronicus 11/09/2011  . Benign positional vertigo 02/10/2011  . Eustachian tube dysfunction 02/10/2011  . OBESITY 02/22/2010  . TOBACCO USE 02/22/2010  . DEPRESSION 02/22/2010  . PEPTIC ULCER DISEASE 02/22/2010  . HIDRADENITIS SUPPURATIVA 02/22/2010    Past Surgical History:  Procedure Laterality Date  . TUBAL LIGATION  2004     OB History   No obstetric history on file.      Home Medications    Prior to Admission medications   Medication Sig Start Date End Date Taking? Authorizing Provider  traMADol (ULTRAM) 50 MG tablet Take 50 mg by mouth 2 (two) times daily as needed for moderate pain.    Yes [provider]  traMADol (ULTRAM-ER) 300 MG 24 hr tablet Take 300 mg by mouth every morning.    Yes [provider]  cetirizine-pseudoephedrine (ZYRTEC-D ALLERGY & CONGESTION)  5-120 MG tablet Take 1 tablet by mouth 2 (two) times daily. Patient not taking: Reported on 07/05/2018 08/22/17   Myra RudeSchmitz, Jeremy E, MD  ibuprofen (ADVIL,MOTRIN) 600 MG tablet Take 1 tablet (600 mg total) by mouth every 8 (eight) hours as needed for up to 7 days. 07/05/18 07/12/18  Linwood DibblesKnapp, Bryleigh Ottaway, MD    Family History Family History  Problem Relation Age of Onset  . Hyperthyroidism Mother   . Diabetes Father   . Cancer Other        lung  . Diabetes Other        1st degree relative  . Cancer Paternal Uncle        gastric    Social History Social History   Tobacco Use  . Smoking status: Current Every Day Smoker    Packs/day: 1.00    Years: 15.00    Pack years: 15.00    Types: Cigarettes    Last attempt to quit: 07/12/2015    Years since quitting: 2.9  . Smokeless tobacco: Never Used  Substance Use Topics  . Alcohol use: Yes    Alcohol/week: 0.0 standard drinks    Comment: socially   . Drug use: No     Allergies   Doxycycline   Review of Systems Review of Systems  All other systems reviewed and are negative.  Physical Exam Updated Vital Signs BP 120/74 (BP Location: Left Arm)   Pulse 91   Temp 98 F (36.7 C) (Oral)   Resp 16   Ht 1.753 m (5\' 9" )   Wt 108.9 kg   LMP 07/02/2018   SpO2 100%   BMI 35.44 kg/m   Physical Exam Vitals signs and nursing note reviewed.  Constitutional:      General: She is not in acute distress.    Appearance: She is well-developed.  HENT:     Head: Normocephalic and atraumatic.     Right Ear: External ear normal.     Left Ear: External ear normal.  Eyes:     General: No scleral icterus.       Right eye: No discharge.        Left eye: No discharge.     Conjunctiva/sclera: Conjunctivae normal.  Neck:     Musculoskeletal: Neck supple.     Trachea: No tracheal deviation.  Cardiovascular:     Rate and Rhythm: Normal rate and regular rhythm.  Pulmonary:     Effort: Pulmonary effort is normal. No respiratory distress.     Breath  sounds: Normal breath sounds. No stridor. No wheezing or rales.  Abdominal:     General: Bowel sounds are normal. There is no distension.     Palpations: Abdomen is soft.     Tenderness: There is no abdominal tenderness. There is no guarding or rebound.  Musculoskeletal:        General: No tenderness.  Skin:    General: Skin is warm and dry.     Findings: No rash.  Neurological:     Mental Status: She is alert.     Cranial Nerves: No cranial nerve deficit (no facial droop, extraocular movements intact, no slurred speech).     Sensory: No sensory deficit.     Motor: No abnormal muscle tone or seizure activity.     Coordination: Coordination normal.      ED Treatments / Results  Labs (all labs ordered are listed, but only abnormal results are displayed) Labs Reviewed  BASIC METABOLIC PANEL  CBC  I-STAT TROPONIN, ED  I-STAT BETA HCG BLOOD, ED ([facility], [facility], [facility] ONLY)  I-STAT BETA HCG BLOOD, ED (NOT ORDERABLE)  POCT I-STAT TROPONIN I  I-STAT TROPONIN, ED  POCT I-STAT TROPONIN I    EKG EKG Interpretation  Date/Time:  Thursday July 05 2018 18:03:21 EST Ventricular Rate:  88 PR Interval:    QRS Duration: 99 QT Interval:  366 QTC Calculation: 443 R Axis:   84 Text Interpretation:  Sinus rhythm Low voltage, precordial leads Baseline wander in lead(s) V6 No significant change since last tracing Confirmed by Linwood Dibbles [phone]) on 07/05/2018 6:25:50 PM   Radiology Dg Chest 2 View  Result Date: 07/05/2018 CLINICAL DATA:  Left chest pain for several days EXAM: CHEST - 2 VIEW COMPARISON:  10/06/2016 FINDINGS: The heart size and mediastinal contours are within normal limits. Both lungs are clear. The visualized skeletal structures are unremarkable. IMPRESSION: No active cardiopulmonary disease. Electronically Signed   By: Alcide Clever M.D.   On: 07/05/2018 19:19    Procedures Procedures (including critical care time)  Medications Ordered in ED Medications - No data to  display   Initial Impression / Assessment and Plan / ED Course  I have reviewed the triage vital signs and the nursing notes.  Pertinent labs & imaging results that were available during my care of the patient were reviewed by  me and considered in my medical decision making (see chart for details).   Patient presented to the emergency room for evaluation of chest pain that started about a week ago.  She does describe discomfort in her chest that radiates to her arm.  Patient has no significant cardiac risk factors.  She is low risk heart score.  Work-up is reassuring.  I doubt acute coronary syndrome and feel that she stable for outpatient follow-up.  Patient also is low risk for pulmonary embolism and is PERC negative.  Discussed trial of NSAIDs and follow-up with her primary care doctor.  Final Clinical Impressions(s) / ED Diagnoses   Final diagnoses:  Chest pain, unspecified type    ED Discharge Orders         Ordered    ibuprofen (ADVIL,MOTRIN) 600 MG tablet  Every 8 hours PRN     07/05/18 2248           Linwood Dibbles, MD 07/05/18 2250

## 2018-07-05 NOTE — ED Triage Notes (Signed)
Patient c/o left chest pain x 4 days. Patient states the pain never goes away,just lessens. patient also c/o left arms pain with numbness and tingling x 3 days. Patient denies any SOB.

## 2018-07-16 NOTE — Telephone Encounter (Signed)
Can you call her to schedule OV for this

## 2018-07-16 NOTE — Telephone Encounter (Signed)
Pt needs to come in for an appointment so we have office notes to send

## 2018-07-17 NOTE — Telephone Encounter (Signed)
Left detailed message for patient to call back and schedule OV with PCP. CRM created.

## 2018-07-19 DIAGNOSIS — F339 Major depressive disorder, recurrent, unspecified: Secondary | ICD-10-CM | POA: Diagnosis not present

## 2018-08-03 ENCOUNTER — Ambulatory Visit: Payer: BLUE CROSS/BLUE SHIELD | Admitting: Nurse Practitioner

## 2018-08-03 DIAGNOSIS — F339 Major depressive disorder, recurrent, unspecified: Secondary | ICD-10-CM | POA: Diagnosis not present

## 2018-08-24 ENCOUNTER — Other Ambulatory Visit (INDEPENDENT_AMBULATORY_CARE_PROVIDER_SITE_OTHER): Payer: BLUE CROSS/BLUE SHIELD

## 2018-08-24 ENCOUNTER — Encounter: Payer: Self-pay | Admitting: Nurse Practitioner

## 2018-08-24 ENCOUNTER — Telehealth: Payer: Self-pay | Admitting: Nurse Practitioner

## 2018-08-24 ENCOUNTER — Ambulatory Visit: Payer: BLUE CROSS/BLUE SHIELD | Admitting: Nurse Practitioner

## 2018-08-24 VITALS — BP 112/86 | HR 80 | Ht 69.0 in | Wt 256.0 lb

## 2018-08-24 DIAGNOSIS — R7309 Other abnormal glucose: Secondary | ICD-10-CM

## 2018-08-24 DIAGNOSIS — R21 Rash and other nonspecific skin eruption: Secondary | ICD-10-CM

## 2018-08-24 DIAGNOSIS — Z0001 Encounter for general adult medical examination with abnormal findings: Secondary | ICD-10-CM

## 2018-08-24 DIAGNOSIS — Z13220 Encounter for screening for lipoid disorders: Secondary | ICD-10-CM

## 2018-08-24 DIAGNOSIS — M549 Dorsalgia, unspecified: Secondary | ICD-10-CM | POA: Diagnosis not present

## 2018-08-24 DIAGNOSIS — G8929 Other chronic pain: Secondary | ICD-10-CM | POA: Diagnosis not present

## 2018-08-24 LAB — COMPREHENSIVE METABOLIC PANEL
ALT: 14 U/L (ref 0–35)
AST: 16 U/L (ref 0–37)
Albumin: 4.3 g/dL (ref 3.5–5.2)
Alkaline Phosphatase: 59 U/L (ref 39–117)
BUN: 8 mg/dL (ref 6–23)
CO2: 25 mEq/L (ref 19–32)
Calcium: 9.2 mg/dL (ref 8.4–10.5)
Chloride: 104 mEq/L (ref 96–112)
Creatinine, Ser: 0.66 mg/dL (ref 0.40–1.20)
GFR: 122.88 mL/min (ref >60.00)
Glucose, Bld: 85 mg/dL (ref 70–99)
Potassium: 4 mEq/L (ref 3.5–5.1)
Sodium: 138 mEq/L (ref 135–145)
Total Bilirubin: 0.4 mg/dL (ref 0.2–1.2)
Total Protein: 7.4 g/dL (ref 6.0–8.3)

## 2018-08-24 LAB — CBC
HCT: 39.5 % (ref 36.0–46.0)
Hemoglobin: 12.9 g/dL (ref 12.0–15.0)
MCHC: 32.7 g/dL (ref 30.0–36.0)
MCV: 83.6 fl (ref 78.0–100.0)
Platelets: 339 10*3/uL (ref 150.0–400.0)
RBC: 4.72 Mil/uL (ref 3.87–5.11)
RDW: 14 % (ref 11.5–15.5)
WBC: 8.3 10*3/uL (ref 4.0–10.5)

## 2018-08-24 LAB — LIPID PANEL
Cholesterol: 137 mg/dL (ref 0–200)
HDL: 45.6 mg/dL (ref >39.00)
LDL Cholesterol: 80 mg/dL (ref 0–99)
NonHDL: 90.93
Total CHOL/HDL Ratio: 3
Triglycerides: 57 mg/dL (ref 0.0–149.0)
VLDL: 11.4 mg/dL (ref 0.0–40.0)

## 2018-08-24 LAB — HEMOGLOBIN A1C: Hgb A1c MFr Bld: 5.9 % (ref 4.6–6.5)

## 2018-08-24 MED ORDER — TRIAMCINOLONE ACETONIDE 0.1 % EX CREA: 1.0000 "application " | TOPICAL_CREAM | Freq: Two times a day (BID) | CUTANEOUS | 0 refills | Status: DC

## 2018-08-24 NOTE — Patient Instructions (Signed)
Head downstairs for labs today  Apply kenalog daily as prescribed If no better in about 1 week, let me know and I will place referral to dermatology  Referral to pain management has been placed  Health Maintenance, Female Adopting a healthy lifestyle and getting preventive care can go a long way to promote health and wellness. Talk with your health care provider about what schedule of regular examinations is right for you. This is a good chance for you to check in with your provider about disease prevention and staying healthy. In between checkups, there are plenty of things you can do on your own. Experts have done a lot of research about which lifestyle changes and preventive measures are most likely to keep you healthy. Ask your health care provider for more information. Weight and diet Eat a healthy diet  Be sure to include plenty of vegetables, fruits, low-fat dairy products, and lean protein.  Do not eat a lot of foods high in solid fats, added sugars, or salt.  Get regular exercise. This is one of the most important things you can do for your health. ? Most adults should exercise for at least 150 minutes each week. The exercise should increase your heart rate and make you sweat (moderate-intensity exercise). ? Most adults should also do strengthening exercises at least twice a week. This is in addition to the moderate-intensity exercise. Maintain a healthy weight  Body mass index (BMI) is a measurement that can be used to identify possible weight problems. It estimates body fat based on height and weight. Your health care provider can help determine your BMI and help you achieve or maintain a healthy weight.  For females 36 years of age and older: ? A BMI below 18.5 is considered underweight. ? A BMI of 18.5 to 24.9 is normal. ? A BMI of 25 to 29.9 is considered overweight. ? A BMI of 30 and above is considered obese. Watch levels of cholesterol and blood lipids  You should  start having your blood tested for lipids and cholesterol at 36 years of age, then have this test every 5 years.  You may need to have your cholesterol levels checked more often if: ? Your lipid or cholesterol levels are high. ? You are older than 36 years of age. ? You are at high risk for heart disease. Cancer screening Lung Cancer  Lung cancer screening is recommended for adults 17-18 years old who are at high risk for lung cancer because of a history of smoking.  A yearly low-dose CT scan of the lungs is recommended for people who: ? Currently smoke. ? Have quit within the past 15 years. ? Have at least a 30-pack-year history of smoking. A pack year is smoking an average of one pack of cigarettes a day for 1 year.  Yearly screening should continue until it has been 15 years since you quit.  Yearly screening should stop if you develop a health problem that would prevent you from having lung cancer treatment. Breast Cancer  Practice breast self-awareness. This means understanding how your breasts normally appear and feel.  It also means doing regular breast self-exams. Let your health care provider know about any changes, no matter how small.  If you are in your 20s or 30s, you should have a clinical breast exam (CBE) by a health care provider every 1-3 years as part of a regular health exam.  If you are 22 or older, have a CBE every year. Also consider  having a breast X-ray (mammogram) every year.  If you have a family history of breast cancer, talk to your health care provider about genetic screening.  If you are at high risk for breast cancer, talk to your health care provider about having an MRI and a mammogram every year.  Breast cancer gene (BRCA) assessment is recommended for women who have family members with BRCA-related cancers. BRCA-related cancers include: ? Breast. ? Ovarian. ? Tubal. ? Peritoneal cancers.  Results of the assessment will determine the need for  genetic counseling and BRCA1 and BRCA2 testing. Cervical Cancer Your health care provider may recommend that you be screened regularly for cancer of the pelvic organs (ovaries, uterus, and vagina). This screening involves a pelvic examination, including checking for microscopic changes to the surface of your cervix (Pap test). You may be encouraged to have this screening done every 3 years, beginning at age 106.  For women ages 23-65, health care providers may recommend pelvic exams and Pap testing every 3 years, or they may recommend the Pap and pelvic exam, combined with testing for human papilloma virus (HPV), every 5 years. Some types of HPV increase your risk of cervical cancer. Testing for HPV may also be done on women of any age with unclear Pap test results.  Other health care providers may not recommend any screening for nonpregnant women who are considered low risk for pelvic cancer and who do not have symptoms. Ask your health care provider if a screening pelvic exam is right for you.  If you have had past treatment for cervical cancer or a condition that could lead to cancer, you need Pap tests and screening for cancer for at least 20 years after your treatment. If Pap tests have been discontinued, your risk factors (such as having a new sexual partner) need to be reassessed to determine if screening should resume. Some women have medical problems that increase the chance of getting cervical cancer. In these cases, your health care provider may recommend more frequent screening and Pap tests. Colorectal Cancer  This type of cancer can be detected and often prevented.  Routine colorectal cancer screening usually begins at 36 years of age and continues through 36 years of age.  Your health care provider may recommend screening at an earlier age if you have risk factors for colon cancer.  Your health care provider may also recommend using home test kits to check for hidden blood in the  stool.  A small camera at the end of a tube can be used to examine your colon directly (sigmoidoscopy or colonoscopy). This is done to check for the earliest forms of colorectal cancer.  Routine screening usually begins at age 80.  Direct examination of the colon should be repeated every 5-10 years through 36 years of age. However, you may need to be screened more often if early forms of precancerous polyps or small growths are found. Skin Cancer  Check your skin from head to toe regularly.  Tell your health care provider about any new moles or changes in moles, especially if there is a change in a mole's shape or color.  Also tell your health care provider if you have a mole that is larger than the size of a pencil eraser.  Always use sunscreen. Apply sunscreen liberally and repeatedly throughout the day.  Protect yourself by wearing long sleeves, pants, a wide-brimmed hat, and sunglasses whenever you are outside. Heart disease, diabetes, and high blood pressure  High blood pressure  causes heart disease and increases the risk of stroke. High blood pressure is more likely to develop in: ? People who have blood pressure in the high end of the normal range (130-139/85-89 mm Hg). ? People who are overweight or obese. ? People who are African American.  If you are 55-69 years of age, have your blood pressure checked every 3-5 years. If you are 64 years of age or older, have your blood pressure checked every year. You should have your blood pressure measured twice-once when you are at a hospital or clinic, and once when you are not at a hospital or clinic. Record the average of the two measurements. To check your blood pressure when you are not at a hospital or clinic, you can use: ? An automated blood pressure machine at a pharmacy. ? A home blood pressure monitor.  If you are between 34 years and 30 years old, ask your health care provider if you should take aspirin to prevent  strokes.  Have regular diabetes screenings. This involves taking a blood sample to check your fasting blood sugar level. ? If you are at a normal weight and have a low risk for diabetes, have this test once every three years after 36 years of age. ? If you are overweight and have a high risk for diabetes, consider being tested at a younger age or more often. Preventing infection Hepatitis B  If you have a higher risk for hepatitis B, you should be screened for this virus. You are considered at high risk for hepatitis B if: ? You were born in a country where hepatitis B is common. Ask your health care provider which countries are considered high risk. ? Your parents were born in a high-risk country, and you have not been immunized against hepatitis B (hepatitis B vaccine). ? You have HIV or AIDS. ? You use needles to inject street drugs. ? You live with someone who has hepatitis B. ? You have had sex with someone who has hepatitis B. ? You get hemodialysis treatment. ? You take certain medicines for conditions, including cancer, organ transplantation, and autoimmune conditions. Hepatitis C  Blood testing is recommended for: ? Everyone born from [birth year] through 1963. ? Anyone with known risk factors for hepatitis C. Sexually transmitted infections (STIs)  You should be screened for sexually transmitted infections (STIs) including gonorrhea and chlamydia if: ? You are sexually active and are younger than 36 years of age. ? You are older than 36 years of age and your health care provider tells you that you are at risk for this type of infection. ? Your sexual activity has changed since you were last screened and you are at an increased risk for chlamydia or gonorrhea. Ask your health care provider if you are at risk.  If you do not have HIV, but are at risk, it may be recommended that you take a prescription medicine daily to prevent HIV infection. This is called pre-exposure prophylaxis  (PrEP). You are considered at risk if: ? You are sexually active and do not regularly use condoms or know the HIV status of your partner(s). ? You take drugs by injection. ? You are sexually active with a partner who has HIV. Talk with your health care provider about whether you are at high risk of being infected with HIV. If you choose to begin PrEP, you should first be tested for HIV. You should then be tested every 3 months for as long as you are  taking PrEP. Pregnancy  If you are premenopausal and you may become pregnant, ask your health care provider about preconception counseling.  If you may become pregnant, take 400 to 800 micrograms (mcg) of folic acid every day.  If you want to prevent pregnancy, talk to your health care provider about birth control (contraception). Osteoporosis and menopause  Osteoporosis is a disease in which the bones lose minerals and strength with aging. This can result in serious bone fractures. Your risk for osteoporosis can be identified using a bone density scan.  If you are 63 years of age or older, or if you are at risk for osteoporosis and fractures, ask your health care provider if you should be screened.  Ask your health care provider whether you should take a calcium or vitamin D supplement to lower your risk for osteoporosis.  Menopause may have certain physical symptoms and risks.  Hormone replacement therapy may reduce some of these symptoms and risks. Talk to your health care provider about whether hormone replacement therapy is right for you. Follow these instructions at home:  Schedule regular health, dental, and eye exams.  Stay current with your immunizations.  Do not use any tobacco products including cigarettes, chewing tobacco, or electronic cigarettes.  If you are pregnant, do not drink alcohol.  If you are breastfeeding, limit how much and how often you drink alcohol.  Limit alcohol intake to no more than 1 drink per day for  nonpregnant women. One drink equals 12 ounces of beer, 5 ounces of wine, or 1 ounces of hard liquor.  Do not use street drugs.  Do not share needles.  Ask your health care provider for help if you need support or information about quitting drugs.  Tell your health care provider if you often feel depressed.  Tell your health care provider if you have ever been abused or do not feel safe at home. This information is not intended to replace advice given to you by your health care provider. Make sure you discuss any questions you have with your health care provider. Document Released: 12/27/2010 Document Revised: 11/19/2015 Document Reviewed: 03/17/2015 Elsevier Interactive Patient Education  2019 [employer].

## 2018-08-24 NOTE — Telephone Encounter (Signed)
-----   Message from Merrilyn Puma, [place] sent at 08/24/2018  4:07 PM EST ----- Left mess for patient to call back. CRM created.

## 2018-08-24 NOTE — Assessment & Plan Note (Signed)
Reviewed annual screening exams, healthy lifestyle, weight loss, additional information provided on AVS Will request GYN records to update chart - CBC; Future - Comprehensive metabolic panel; Future - Hemoglobin A1c; Future - Lipid panel; Future  Screening for cholesterol level She is fasting - Lipid panel; Future  Elevated glucose - Hemoglobin A1c; Future

## 2018-08-24 NOTE — Telephone Encounter (Signed)
Attempted to return call to patient for lab results  Left VM to return call to office.

## 2018-08-24 NOTE — Telephone Encounter (Signed)
Copied from CRM [id number]. Topic: Quick Communication - Lab Results (Clinic Use ONLY) >> Aug 24, 2018  4:08 PM Merrilyn Puma, CMA wrote: Left message for pt to call back Re: recent labs.  [facility] may inform patient of results. >> Aug 24, 2018  4:17 PM Arlyss Gandy, NT wrote: Pt calling to receive lab results.

## 2018-08-24 NOTE — Assessment & Plan Note (Signed)
Referral placed - Ambulatory referral to Pain Clinic

## 2018-08-24 NOTE — Telephone Encounter (Signed)
Charted in result notes. 

## 2018-08-24 NOTE — Progress Notes (Signed)
Tara Ward is a 38 y.o. female with the following history as recorded in [employer]:  Patient Active Problem List   Diagnosis Date Noted  . Upper respiratory tract infection 08/22/2017  . Encounter for general adult medical examination with abnormal findings 08/21/2013  . Chronic upper back pain 07/31/2013  . Pilonidal cyst with abscess 05/11/2013  . Lichen simplex chronicus 11/09/2011  . Benign positional vertigo 02/10/2011  . Eustachian tube dysfunction 02/10/2011  . OBESITY 02/22/2010  . TOBACCO USE 02/22/2010  . DEPRESSION 02/22/2010  . PEPTIC ULCER DISEASE 02/22/2010  . HIDRADENITIS SUPPURATIVA 02/22/2010    Current Outpatient Medications  Medication Sig Dispense Refill  . traMADol (ULTRAM) 50 MG tablet Take 50 mg by mouth 2 (two) times daily as needed for moderate pain.     . traMADol (ULTRAM-ER) 300 MG 24 hr tablet Take 300 mg by mouth every morning.     . cetirizine-pseudoephedrine (ZYRTEC-D ALLERGY & CONGESTION) 5-120 MG tablet Take 1 tablet by mouth 2 (two) times daily. (Patient not taking: Reported on 08/24/2018) 30 tablet 0  . triamcinolone cream (KENALOG) 0.1 % Apply 1 application topically 2 (two) times daily. 30 g 0   No current facility-administered medications for this visit.     Allergies: Doxycycline  Past Medical History:  Diagnosis Date  . Anxiety   . Condyloma acuminata   . Depression   . Hidradenitis suppurativa    recurrent  . Peptic ulcer disease   . Pilonidal cyst     Past Surgical History:  Procedure Laterality Date  . TUBAL LIGATION  2004    Family History  Problem Relation Age of Onset  . Hyperthyroidism Mother   . Diabetes Father   . Cancer Other        lung  . Diabetes Other        1st degree relative  . Cancer Paternal Uncle        gastric    Social History   Tobacco Use  . Smoking status: Current Every Day Smoker    Packs/day: 1.00    Years: 15.00    Pack years: 15.00    Types: Cigarettes    Last attempt to quit:  07/12/2015    Years since quitting: 3.1  . Smokeless tobacco: Never Used  Substance Use Topics  . Alcohol use: Yes    Alcohol/week: 0.0 standard drinks    Comment: socially      Subjective:  Here today for CPE. She is also requesting referral to a new pain clinic, has been going to preferred pain management for some time now for chronic neck pain, but due to new insurance needs to go to another clinic. Last dental exam: 2018 Last vision exam: 2019 PAP: up to date by GYN dr lowe per pt Lipids: lipid panel ordered DM screening- A1c ordered, family history, high glucose readings in past Vaccinations: up to date Diet and exercise: trying to eat a healthy diet, working on losing weight   Review of Systems  Constitutional: Negative for chills and fever.  HENT: Negative for hearing loss.   Eyes: Negative for blurred vision and double vision.  Respiratory: Negative for cough and shortness of breath.   Cardiovascular: Negative for chest pain and palpitations.  Gastrointestinal: Negative for abdominal pain, constipation, diarrhea, heartburn, nausea and vomiting.  Genitourinary: Negative for dysuria and hematuria.  Musculoskeletal: Negative for falls.  Skin: Positive for rash.  Neurological: Negative for speech change, loss of consciousness and weakness.  Endo/Heme/Allergies: Does not bruise/bleed  easily.  Psychiatric/Behavioral: Negative for depression. The patient is not nervous/anxious.    Rash- dry itchy rash to right medial ankle for about the past year Similar rash to left medial ankle but less severe She describes as "itchy thick skin" Applies lotion daily with no relief No redness, drainage  Objective:  Vitals:   08/24/18 0820  BP: 112/86  Pulse: 80  SpO2: 95%  Weight: 256 lb (116.1 kg)  Height: 5\' 9"  (1.753 m)  Body mass index is 37.8 kg/m.   General: Well developed, well nourished, in no acute distress  Skin : Warm and dry. Dry scaly excoriated patch to right medial  ankle, no redness or exudate. Head: Normocephalic and atraumatic  Eyes: Sclera and conjunctiva clear; pupils round and reactive to light; extraocular movements intact  Ears: External normal; canals clear; tympanic membranes normal  Oropharynx: Pink, supple. No suspicious lesions  Neck: Supple without thyromegaly, adenopathy  Lungs: Respirations unlabored; clear to auscultation bilaterally without wheeze, rales, rhonchi  CVS exam: normal rate and regular rhythm, S1 and S2 normal.  Abdomen: Soft; nontender; nondistended; no masses or hepatosplenomegaly  Musculoskeletal: No deformities; no active joint inflammation  Extremities: No edema, cyanosis, clubbing  Vessels: Symmetric bilaterally  Neurologic: Alert and oriented; speech intact; face symmetrical; moves all extremities well; CNII-XII intact without focal deficit  Psychiatric: Normal mood and affect.  Assessment:  1. Encounter for general adult medical examination with abnormal findings   2. Chronic upper back pain   3. Rash   4. Screening for cholesterol level   5. Elevated glucose     Plan:   Rash ?eczema Triamcinolone course- med dosing, side effects discussed Home management, red flags and return precautions including when to seek immediate care discussed and printed on AVS She will f/u if no improvement with triamcinolone, will consider referral to dermatology if no improvement - triamcinolone cream (KENALOG) 0.1 %; Apply 1 application topically 2 (two) times daily.  Dispense: 30 g; Refill: 0  Return in about 1 year (around 08/25/2019) for cpe.  Orders Placed This Encounter  Procedures  . CBC    Standing Status:   Future    Number of Occurrences:   1    Standing Expiration Date:   08/25/2019  . Comprehensive metabolic panel    Standing Status:   Future    Number of Occurrences:   1    Standing Expiration Date:   08/25/2019  . Hemoglobin A1c    Standing Status:   Future    Number of Occurrences:   1    Standing  Expiration Date:   08/25/2019  . Lipid panel    Standing Status:   Future    Number of Occurrences:   1    Standing Expiration Date:   08/25/2019  . Ambulatory referral to Pain Clinic    Referral Priority:   Routine    Referral Type:   Consultation    Referral Reason:   Specialty Services Required    Requested Specialty:   Pain Medicine    Number of Visits Requested:   1    Requested Prescriptions   Signed Prescriptions Disp Refills  . triamcinolone cream (KENALOG) 0.1 % 30 g 0    Sig: Apply 1 application topically 2 (two) times daily.

## 2018-08-27 ENCOUNTER — Encounter: Payer: Self-pay | Admitting: *Deleted

## 2018-10-01 ENCOUNTER — Other Ambulatory Visit: Payer: Self-pay | Admitting: *Deleted

## 2018-10-01 DIAGNOSIS — R21 Rash and other nonspecific skin eruption: Secondary | ICD-10-CM

## 2018-10-01 MED ORDER — TRIAMCINOLONE ACETONIDE 0.1 % EX CREA: 1.0000 "application " | TOPICAL_CREAM | Freq: Two times a day (BID) | CUTANEOUS | 0 refills | Status: DC

## 2018-11-29 ENCOUNTER — Other Ambulatory Visit: Payer: Self-pay | Admitting: Internal Medicine

## 2018-11-29 DIAGNOSIS — R21 Rash and other nonspecific skin eruption: Secondary | ICD-10-CM

## 2018-12-25 ENCOUNTER — Telehealth: Payer: BLUE CROSS/BLUE SHIELD | Admitting: Physician Assistant

## 2018-12-25 ENCOUNTER — Other Ambulatory Visit: Payer: Self-pay | Admitting: *Deleted

## 2018-12-25 DIAGNOSIS — B354 Tinea corporis: Secondary | ICD-10-CM | POA: Diagnosis not present

## 2018-12-25 DIAGNOSIS — Z20822 Contact with and (suspected) exposure to covid-19: Secondary | ICD-10-CM

## 2018-12-25 MED ORDER — TERBINAFINE HCL 250 MG PO TABS: 250.0000 mg | ORAL_TABLET | Freq: Every day | ORAL | 0 refills | Status: DC

## 2018-12-25 NOTE — Progress Notes (Signed)
E Visit for Rash  We are sorry that you are not feeling well. Here is how we plan to help!  It looks like you have tinea corpora.   I am prescribing a short course of terbinafine which is an antifungal. It is waiting at the pharmacy.      HOME CARE:  Take cool showers and avoid direct sunlight. Apply cool compress or wet dressings. Take a bath in an oatmeal bath.  Sprinkle content of one Aveeno packet under running faucet with comfortably warm water.  Bathe for 15-20 minutes, 1-2 times daily.  Pat dry with a towel. Do not rub the rash. Use hydrocortisone cream. Take an antihistamine like Benadryl for widespread rashes that itch.  The adult dose of Benadryl is 25-50 mg by mouth 4 times daily. Caution:  This type of medication may cause sleepiness.  Do not drink alcohol, drive, or operate dangerous machinery while taking antihistaminesE Visit for Rash  We are sorry that you are not feeling well. Here is how we plan to help!  It looks like you have tinea corpora.   I am prescribing a short course of terbinafine which is an antifungal. It is waiting at the pharmacy.      HOME CARE:  Take cool showers and avoid direct sunlight. Apply cool compress or wet dressings. Take a bath in an oatmeal bath.  Sprinkle content of one Aveeno packet under running faucet with comfortably warm water.  Bathe for 15-20 minutes, 1-2 times daily.  Pat dry with a towel. Do not rub the rash. Use hydrocortisone cream. Take an antihistamine like Benadryl for widespread rashes that itch.  The adult dose of Benadryl is 25-50 mg by mouth 4 times daily. Caution:  This type of medication may cause sleepiness.  Do not drink alcohol, drive, or operate dangerous machinery while taking antihistamines.  Do not take these medications if you have prostate enlargement.  Read package instructions thoroughly on all medications that you take.  GET HELP RIGHT AWAY IF:  Symptoms don't go away after  treatment. Severe itching that persists. If you rash spreads or swells. If you rash begins to smell. If it blisters and opens or develops a yellow-brown crust. You develop a fever. You have a sore throat. You become short of breath.  MAKE SURE YOU:  Understand these instructions. Will watch your condition. Will get help right away if you are not doing well or get worse.  Thank you for choosing an e-visit. Your e-visit answers were reviewed by a board certified advanced clinical practitioner to complete your personal care plan. Depending upon the condition, your plan could have included both over the counter or prescription medications. Please review your pharmacy choice. Be sure that the pharmacy you have chosen is open so that you can pick up your prescription now.  If there is a problem you may message your provider in [place] to have the prescription routed to another pharmacy. Your safety is important to [place]. If you have drug allergies check your prescription carefully.  For the next 24 hours, you can use MyChart to ask questions about today's visit, request a non-urgent call back, or ask for a work or school excuse from your e-visit provider. You will get an email in the next two days asking about your experience. I hope that your e-visit has been valuable and will speed your recovery..  Do not take these medications if you have prostate enlargement.  Read package instructions thoroughly on all medications that  you take.  GET HELP RIGHT AWAY IF:  Symptoms don't go away after treatment. Severe itching that persists. If you rash spreads or swells. If you rash begins to smell. If it blisters and opens or develops a yellow-brown crust. You develop a fever. You have a sore throat. You become short of breath.  MAKE SURE YOU:  Understand these instructions. Will watch your condition. Will get help right away if you are not doing well or get worse.  Thank you for choosing  an e-visit. Your e-visit answers were reviewed by a board certified advanced clinical practitioner to complete your personal care plan. Depending upon the condition, your plan could have included both over the counter or prescription medications. Please review your pharmacy choice. Be sure that the pharmacy you have chosen is open so that you can pick up your prescription now.  If there is a problem you may message your provider in MyChart to have the prescription routed to another pharmacy. Your safety is important to us. If you have drug allergies check your prescription carefully.  For the next 24 hours, you can use MyChart to ask questions about today's visit, request a non-urgent call back, or ask for a work or school excuse from your e-visit provider. You will get an email in the next two days asking about your experience. I hope that your e-visit has been valuable and will speed your recovery.  A total of 5-10 minutes was spent evaluating this patients questionnaire and formulating a plan of care.

## 2018-12-31 LAB — NOVEL CORONAVIRUS, NAA: SARS-CoV-2, NAA: NOT DETECTED

## 2019-01-01 ENCOUNTER — Telehealth: Payer: Self-pay | Admitting: Family

## 2019-01-01 ENCOUNTER — Ambulatory Visit (INDEPENDENT_AMBULATORY_CARE_PROVIDER_SITE_OTHER)
Admission: RE | Admit: 2019-01-01 | Discharge: 2019-01-01 | Disposition: A | Discharge status: Home / Self Care | Payer: BLUE CROSS/BLUE SHIELD | Source: Ambulatory Visit

## 2019-01-01 ENCOUNTER — Telehealth: Payer: BLUE CROSS/BLUE SHIELD

## 2019-01-01 DIAGNOSIS — R0789 Other chest pain: Secondary | ICD-10-CM | POA: Diagnosis not present

## 2019-01-01 DIAGNOSIS — K219 Gastro-esophageal reflux disease without esophagitis: Secondary | ICD-10-CM | POA: Diagnosis not present

## 2019-01-01 MED ORDER — OMEPRAZOLE 20 MG PO CPDR: 20.0000 mg | DELAYED_RELEASE_CAPSULE | Freq: Every day | ORAL | 0 refills | Status: DC

## 2019-01-01 MED ORDER — ONDANSETRON HCL 4 MG PO TABS: 4.0000 mg | ORAL_TABLET | Freq: Four times a day (QID) | ORAL | 0 refills | Status: DC

## 2019-01-01 NOTE — ED Provider Notes (Signed)
Tara Ward    Virtual Visit via Video Note:  [name]  initiated request for Telemedicine visit with [facility] team. I connected with Tara Ward  on 01/01/2019 at 2:32 PM  for a synchronized telemedicine visit using a video enabled HIPPA compliant telemedicine application. I verified that I am speaking with Tara Ward  using two identifiers. Tara Box, PA-C  was physically located in a [facility] and Tara Ward was located at a different location.   The limitations of evaluation and management by telemedicine as well as the availability of in-person appointments were discussed. Patient was informed that she  may incur a bill ( including co-pay) for this virtual visit encounter. Tara Ward  expressed understanding and gave verbal consent to proceed with virtual visit.   [id number] 01/01/19 Arrival Time: 56  CC: Chest DISCOMFORT  SUBJECTIVE:  Tara Ward is a 38 y.o. female hx significant for anxiety, condyloma acuminata, depression , HS, PUD, and pilonidal cyst, who presents with complaint of chest discomfort that began 3 days ago.  Recently started taking terbinafine (12/26/2018) for possible fungal infection.  Also takes tramadol.  Localizes pain to substernal region.  Describes as constant and "heaviness." Has tried OTC gas X without relief.  Symptoms made worse with laying flat.  Denies association with eating or taking medication.  Reports similar symptoms in the past related to "bruised" ribs secondary to cough. Reports increased belching.    Denies fever, chills, vision changes, lightheadedness, syncope, nausea, vomiting, SOB, calf pain, calf swelling, recent long travel by plan or car, hx of blood clot, changes in bowel or bladder function, anxiety, diaphoresis.  Denies family hx of stroke or MI.    No LMP recorded.  ROS: As per HPI.  Past Medical History:  Diagnosis Date  . Anxiety   . Condyloma  acuminata   . Depression   . Hidradenitis suppurativa    recurrent  . Peptic ulcer disease   . Pilonidal cyst    Past Surgical History:  Procedure Laterality Date  . TUBAL LIGATION  2004   Allergies  Allergen Reactions  . Doxycycline Swelling    SWELLING IN OPTIC NERVE.   No current facility-administered medications on file prior to encounter.    Current Outpatient Medications on File Prior to Encounter  Medication Sig Dispense Refill  . terbinafine (LAMISIL) 250 MG tablet Take 1 tablet (250 mg total) by mouth daily. 14 tablet 0  . traMADol (ULTRAM) 50 MG tablet Take 50 mg by mouth 2 (two) times daily as needed for moderate pain.     . traMADol (ULTRAM-ER) 300 MG 24 hr tablet Take 300 mg by mouth every morning.     . triamcinolone cream (KENALOG) 0.1 % APPLY EXTERNALLY TO THE AFFECTED AREA TWICE DAILY 30 g 0   Social History   Socioeconomic History  . Marital status: Married    Spouse name: Not on file  . Number of children: 0  . Years of education: 72  . Highest education level: Not on file  Occupational History  . Occupation: Quarry manager, Pharmacologist: [employer]  Social Needs  . Financial resource strain: Not on file  . Food insecurity    Worry: Not on file    Inability: Not on file  . Transportation needs    Medical: Not on file    Non-medical: Not on file  Tobacco Use  . Smoking status:  Current Every Day Smoker    Packs/day: 1.00    Years: 15.00    Pack years: 15.00    Types: Cigarettes    Last attempt to quit: 07/12/2015    Years since quitting: 3.4  . Smokeless tobacco: Never Used  Substance and Sexual Activity  . Alcohol use: Yes    Alcohol/week: 0.0 standard drinks    Comment: socially   . Drug use: No  . Sexual activity: Not on file  Lifestyle  . Physical activity    Days per week: Not on file    Minutes per session: Not on file  . Stress: Not on file  Relationships  . Social Musicianconnections    Talks on phone: Not on file    Gets together:  Not on file    Attends religious service: Not on file    Active member of club or organization: Not on file    Attends meetings of clubs or organizations: Not on file    Relationship status: Not on file  . Intimate partner violence    Fear of current or ex partner: Not on file    Emotionally abused: Not on file    Physically abused: Not on file    Forced sexual activity: Not on file  Other Topics Concern  . Not on file  Social History Narrative   No regular excerise   Fun: Spend time with family.   Denies religious beliefs that would effect healthcare.    Family History  Problem Relation Age of Onset  . Hyperthyroidism Mother   . Diabetes Father   . Cancer Other        lung  . Diabetes Other        1st degree relative  . Cancer Paternal Uncle        gastric     OBJECTIVE:  There were no vitals filed for this visit.  General appearance: alert; no distress Eyes: EOMI grossly HENT: normocephalic; atraumatic Neck: supple with FROM Lungs: normal respiratory effort; speaking in full sentences without difficulty Extremities: moves extremities without difficulty Skin: No obvious rashes Neurologic: No facial asymmetries Psychological: alert and cooperative; normal mood and affect   ASSESSMENT & PLAN:  1. Chest discomfort   2. Gastroesophageal reflux disease, esophagitis presence not specified     Meds ordered this encounter  Medications  . omeprazole (PRILOSEC) 20 MG capsule    Sig: Take 1 capsule (20 mg total) by mouth daily.    Dispense:  20 capsule    Refill:  0    Order Specific Question:   Supervising Provider    Answer:   Tara Ward, Tara Ward [[id number]]  . ondansetron (ZOFRAN) 4 MG tablet    Sig: Take 1 tablet (4 mg total) by mouth every 6 (six) hours.    Dispense:  12 tablet    Refill:  0    Order Specific Question:   Supervising Provider    Answer:   Tara MooreELSON, Tara Ward [[id number]]   Cannot rule out cardiac cause or heart disease at this time.  Encouraged  patient to be evaluated in person to have EKG.  Patient declines at this time and would like to try outpatient therapy for acid reflux.  Instructed patient to follow up in person (via urgent care OR ED) if symptoms do not improve with medications over the next 12-24 hours.  Patient aware of the risk associated with this decision including organ damage, failure, and even death, and would still like to proceed.  Omeprazole prescribed take daily Zofran prescribed.  Take as needed for nausea.   Avoid eating 2-3 hours before bed Elevate head of bed.  Avoid chocolate, caffeine, alcohol, onion, and mint prior to bed.  This relaxes the bottom part of your esophagus and can make your symptoms worse.  Follow up with PCP if symptoms persists Return or go to the ED if you have any new or worsening symptoms fever, chills, nausea, vomiting, worsening chest pain, shortness of breath, dizziness, vision changes, abdominal pain, changes in bowel or bladder habits, etc...  I discussed the assessment and treatment plan with the patient. The patient was provided an opportunity to ask questions and all were answered. The patient agreed with the plan and demonstrated an understanding of the instructions.   The patient was advised to call back or seek an in-person evaluation if the symptoms worsen or if the condition fails to improve as anticipated.  I provided 15 minutes of non-face-to-face time during this encounter.  Central HighBrittany Jsoeph Podesta, PA-C  01/01/2019 2:32 PM    Rennis HardingWurst, Devun Anna, PA-C 01/01/19 1445

## 2019-01-01 NOTE — Telephone Encounter (Signed)
Can you please call her? Need to clarify her chest pain symptoms. It looks like she was told she needed to be seen as soon as possible- I don't know what type of access I have.

## 2019-01-01 NOTE — Telephone Encounter (Signed)
lvm asking patient to call Shaheim Mahar,RN at [place] office back to discuss symptoms she is having, or if symptoms persist/get worse, accompany shortness of breath, it is recommended to go to ED

## 2019-01-01 NOTE — Discharge Instructions (Addendum)
Cannot rule out cardiac cause or heart disease at this time.  Encouraged patient to be evaluated in person to have EKG.  Patient declines at this time and would like to try outpatient therapy for acid reflux.  Instructed patient to follow up in person over  if symptoms do not improve with medications over the next 12-24 hours.  Patient aware of the risk associated with this decision including organ damage, failure, and even death, and would still like to proceed.    Omeprazole prescribed take daily Zofran prescribed.  Take as needed for nausea.   Avoid eating 2-3 hours before bed Elevate head of bed.  Avoid chocolate, caffeine, alcohol, onion, and mint prior to bed.  This relaxes the bottom part of your esophagus and can make your symptoms worse.  Follow up with PCP if symptoms persists Return or go to the ED if you have any new or worsening symptoms fever, chills, nausea, vomiting, worsening chest pain, shortness of breath, dizziness, vision changes, abdominal pain, changes in bowel or bladder habits, etc..Marland Kitchen

## 2019-01-13 ENCOUNTER — Other Ambulatory Visit: Payer: Self-pay | Admitting: Internal Medicine

## 2019-01-13 DIAGNOSIS — R21 Rash and other nonspecific skin eruption: Secondary | ICD-10-CM

## 2019-01-23 DIAGNOSIS — F339 Major depressive disorder, recurrent, unspecified: Secondary | ICD-10-CM | POA: Diagnosis not present

## 2019-01-29 ENCOUNTER — Telehealth: Payer: Self-pay | Admitting: Family

## 2019-01-29 NOTE — Telephone Encounter (Signed)
Left message for patient to call back to schedule.  °

## 2019-01-29 NOTE — Telephone Encounter (Signed)
Pt scheduled  

## 2019-01-29 NOTE — Telephone Encounter (Signed)
Patient is needing to establish care with a new provider. Would you be willing to see her?

## 2019-01-29 NOTE — Telephone Encounter (Signed)
Yes, that's fine 

## 2019-02-07 DIAGNOSIS — F339 Major depressive disorder, recurrent, unspecified: Secondary | ICD-10-CM | POA: Diagnosis not present

## 2019-02-19 ENCOUNTER — Ambulatory Visit (INDEPENDENT_AMBULATORY_CARE_PROVIDER_SITE_OTHER): Payer: BLUE CROSS/BLUE SHIELD | Admitting: Family

## 2019-02-19 ENCOUNTER — Other Ambulatory Visit: Payer: Self-pay

## 2019-02-19 ENCOUNTER — Encounter: Payer: Self-pay | Admitting: Family

## 2019-02-19 ENCOUNTER — Other Ambulatory Visit (INDEPENDENT_AMBULATORY_CARE_PROVIDER_SITE_OTHER): Payer: BLUE CROSS/BLUE SHIELD

## 2019-02-19 VITALS — BP 124/76 | HR 80 | Temp 98.2°F | Resp 16 | Ht 69.0 in | Wt 248.2 lb

## 2019-02-19 DIAGNOSIS — R21 Rash and other nonspecific skin eruption: Secondary | ICD-10-CM

## 2019-02-19 DIAGNOSIS — R7303 Prediabetes: Secondary | ICD-10-CM | POA: Diagnosis not present

## 2019-02-19 DIAGNOSIS — B369 Superficial mycosis, unspecified: Secondary | ICD-10-CM

## 2019-02-19 DIAGNOSIS — R109 Unspecified abdominal pain: Secondary | ICD-10-CM | POA: Diagnosis not present

## 2019-02-19 DIAGNOSIS — F17200 Nicotine dependence, unspecified, uncomplicated: Secondary | ICD-10-CM

## 2019-02-19 DIAGNOSIS — Z20828 Contact with and (suspected) exposure to other viral communicable diseases: Secondary | ICD-10-CM | POA: Diagnosis not present

## 2019-02-19 LAB — COMPREHENSIVE METABOLIC PANEL
ALT: 8 U/L (ref 0–35)
AST: 12 U/L (ref 0–37)
Albumin: 4.2 g/dL (ref 3.5–5.2)
Alkaline Phosphatase: 54 U/L (ref 39–117)
BUN: 8 mg/dL (ref 6–23)
CO2: 28 mEq/L (ref 19–32)
Calcium: 9 mg/dL (ref 8.4–10.5)
Chloride: 105 mEq/L (ref 96–112)
Creatinine, Ser: 0.57 mg/dL (ref 0.40–1.20)
GFR: 145.12 mL/min (ref >60.00)
Glucose, Bld: 90 mg/dL (ref 70–99)
Potassium: 4.2 mEq/L (ref 3.5–5.1)
Sodium: 138 mEq/L (ref 135–145)
Total Bilirubin: 0.3 mg/dL (ref 0.2–1.2)
Total Protein: 7.2 g/dL (ref 6.0–8.3)

## 2019-02-19 LAB — HEMOGLOBIN A1C: Hgb A1c MFr Bld: 5.9 % (ref 4.6–6.5)

## 2019-02-19 MED ORDER — TRIAMCINOLONE ACETONIDE 0.1 % EX CREA: TOPICAL_CREAM | Freq: Two times a day (BID) | CUTANEOUS | 0 refills | Status: DC

## 2019-02-19 MED ORDER — PANTOPRAZOLE SODIUM 40 MG PO TBEC: 40.0000 mg | DELAYED_RELEASE_TABLET | Freq: Every day | ORAL | 1 refills | Status: DC

## 2019-02-19 MED ORDER — FLUCONAZOLE 100 MG PO TABS: 100.0000 mg | ORAL_TABLET | Freq: Every day | ORAL | 0 refills | Status: DC

## 2019-02-19 NOTE — Progress Notes (Signed)
Tara Ward is a 38 y.o. female with the following history as recorded in EpicCare:  Patient Active Problem List   Diagnosis Date Noted  . Upper respiratory tract infection 08/22/2017  . Encounter for general adult medical examination with abnormal findings 08/21/2013  . Chronic upper back pain 07/31/2013  . Pilonidal cyst with abscess 05/11/2013  . Lichen simplex chronicus 11/09/2011  . Benign positional vertigo 02/10/2011  . Eustachian tube dysfunction 02/10/2011  . OBESITY 02/22/2010  . TOBACCO USE 02/22/2010  . DEPRESSION 02/22/2010  . PEPTIC ULCER DISEASE 02/22/2010  . HIDRADENITIS SUPPURATIVA 02/22/2010    Current Outpatient Medications  Medication Sig Dispense Refill  . esomeprazole (NEXIUM) 20 MG capsule Take 20 mg by mouth daily at 12 noon.    . traMADol (ULTRAM) 50 MG tablet Take 50 mg by mouth 2 (two) times daily as needed for moderate pain.     . traMADol (ULTRAM-ER) 300 MG 24 hr tablet Take 300 mg by mouth every morning.     . triamcinolone cream (KENALOG) 0.1 % Apply topically 2 (two) times daily. 30 g 0  . fluconazole (DIFLUCAN) 100 MG tablet Take 1 tablet (100 mg total) by mouth daily. 10 tablet 0  . pantoprazole (PROTONIX) 40 MG tablet Take 1 tablet (40 mg total) by mouth daily. 30 tablet 1   No current facility-administered medications for this visit.     Allergies: Doxycycline and Lamisil [terbinafine hcl]  Past Medical History:  Diagnosis Date  . Anxiety   . Condyloma acuminata   . Depression   . Hidradenitis suppurativa    recurrent  . Peptic ulcer disease   . Pilonidal cyst     Past Surgical History:  Procedure Laterality Date  . TUBAL LIGATION  2004    Family History  Problem Relation Age of Onset  . Hyperthyroidism Mother   . Diabetes Father   . Cancer Other        lung  . Diabetes Other        1st degree relative  . Cancer Paternal Uncle        gastric    Social History   Tobacco Use  . Smoking status: Current Every Day Smoker   Packs/day: 1.00    Years: 15.00    Pack years: 15.00    Types: Cigarettes    Last attempt to quit: 07/12/2015    Years since quitting: 3.6  . Smokeless tobacco: Never Used  Substance Use Topics  . Alcohol use: Yes    Alcohol/week: 0.0 standard drinks    Comment: socially     Subjective:  Presents with numerous concerns:  1) had allergic type reaction to Lamisil in early July- developed severe GERD symptoms; was treated with Prilosec- now OTC Nexium with some benefit; has continued to experience daily GERD symptoms especially after eating "carb heavy meals." Denies any chest pain on exertion, shortness of breath, blurred vision or headache. No prior history of GERD before this particular episode; 2) Chronic rash on right ankle- has been using Triamcinolone almost daily since February with some improvement but no resolution; 3) Rash in bilateral armpit region- does have history of hidradenitis suppurativa and pre-diabetes;  4) + smoker- smoking at least 1 ppd; was able to quit in the past year but has started back up with quarantine    LMP- now   Objective:  Vitals:   02/19/19 0858  BP: 124/76  Pulse: 80  Resp: 16  Temp: 98.2 F (36.8 C)  TempSrc: Oral  Weight: 248 lb 3.2 oz (112.6 kg)  Height: 5' 9" (1.753 m)    General: Well developed, well nourished, in no acute distress  Skin : Warm and dry. C/w fungal infection in bilateral axillar regions; darkened, thick circular region noted over right ankle Head: Normocephalic and atraumatic  Eyes: Sclera and conjunctiva clear; pupils round and reactive to light; extraocular movements intact  Ears: External normal; canals clear; tympanic membranes normal  Oropharynx: Pink, supple. No suspicious lesions  Neck: Supple without thyromegaly, adenopathy  Lungs: Respirations unlabored; clear to auscultation bilaterally without wheeze, rales, rhonchi  CVS exam: normal rate and regular rhythm.  Abdomen: Soft; nontender; nondistended;  normoactive bowel sounds; no masses or hepatosplenomegaly  Neurologic: Alert and oriented; speech intact; face symmetrical; moves all extremities well; CNII-XII intact without focal deficit   Assessment:  1. Rash   2. Pre-diabetes   3. Abdominal pain, unspecified abdominal location   4. Fungal infection of skin   5. TOBACCO USE     Plan:  1. Refer to dermatology due to length of time symptoms present; 2. Check Hgba1c today; 3. D/C OTC Nexium- change to Protonix 40 mg; update abdominal ultrasound; 4. Rx for Diflucan  100 mg qd x 10 days; encouraged to try and keep area dry; 5. Stressed need to quit smoking;    No follow-ups on file.  Orders Placed This Encounter  Procedures  . US Abdomen Complete    Standing Status:   Future    Standing Expiration Date:   04/20/2020    Order Specific Question:   Reason for Exam (SYMPTOM  OR DIAGNOSIS REQUIRED)    Answer:   abdominal pain    Order Specific Question:   Preferred imaging location?    Answer:   [facility]  . Comp Met (CMET)    Standing Status:   Future    Number of Occurrences:   1    Standing Expiration Date:   02/19/2020  . HgB A1c    Standing Status:   Future    Number of Occurrences:   1    Standing Expiration Date:   02/19/2020  . Ambulatory referral to Dermatology    Referral Priority:   Routine    Referral Type:   Consultation    Referral Reason:   Specialty Services Required    Requested Specialty:   Dermatology    Number of Visits Requested:   1    Requested Prescriptions   Signed Prescriptions Disp Refills  . pantoprazole (PROTONIX) 40 MG tablet 30 tablet 1    Sig: Take 1 tablet (40 mg total) by mouth daily.  . fluconazole (DIFLUCAN) 100 MG tablet 10 tablet 0    Sig: Take 1 tablet (100 mg total) by mouth daily.  Marland Kitchen triamcinolone cream (KENALOG) 0.1 % 30 g 0    Sig: Apply topically 2 (two) times daily.

## 2019-02-21 DIAGNOSIS — F339 Major depressive disorder, recurrent, unspecified: Secondary | ICD-10-CM | POA: Diagnosis not present

## 2019-03-07 DIAGNOSIS — F339 Major depressive disorder, recurrent, unspecified: Secondary | ICD-10-CM | POA: Diagnosis not present

## 2019-03-11 DIAGNOSIS — M542 Cervicalgia: Secondary | ICD-10-CM | POA: Diagnosis not present

## 2019-03-11 DIAGNOSIS — G894 Chronic pain syndrome: Secondary | ICD-10-CM | POA: Diagnosis not present

## 2019-03-11 DIAGNOSIS — Z79891 Long term (current) use of opiate analgesic: Secondary | ICD-10-CM | POA: Diagnosis not present

## 2019-03-11 DIAGNOSIS — L732 Hidradenitis suppurativa: Secondary | ICD-10-CM | POA: Diagnosis not present

## 2019-03-11 DIAGNOSIS — M545 Low back pain: Secondary | ICD-10-CM | POA: Diagnosis not present

## 2019-03-18 DIAGNOSIS — Z01419 Encounter for gynecological examination (general) (routine) without abnormal findings: Secondary | ICD-10-CM | POA: Diagnosis not present

## 2019-03-18 DIAGNOSIS — Z6836 Body mass index (BMI) 36.0-36.9, adult: Secondary | ICD-10-CM | POA: Diagnosis not present

## 2019-03-25 ENCOUNTER — Ambulatory Visit
Admission: RE | Admit: 2019-03-25 | Discharge: 2019-03-25 | Disposition: A | Discharge status: Home / Self Care | Payer: BLUE CROSS/BLUE SHIELD | Source: Ambulatory Visit | Attending: Family | Admitting: Family

## 2019-03-25 DIAGNOSIS — R109 Unspecified abdominal pain: Secondary | ICD-10-CM | POA: Diagnosis not present

## 2019-03-28 DIAGNOSIS — F339 Major depressive disorder, recurrent, unspecified: Secondary | ICD-10-CM | POA: Diagnosis not present

## 2019-04-09 DIAGNOSIS — M545 Low back pain: Secondary | ICD-10-CM | POA: Diagnosis not present

## 2019-04-09 DIAGNOSIS — L732 Hidradenitis suppurativa: Secondary | ICD-10-CM | POA: Diagnosis not present

## 2019-04-09 DIAGNOSIS — M542 Cervicalgia: Secondary | ICD-10-CM | POA: Diagnosis not present

## 2019-04-09 DIAGNOSIS — G894 Chronic pain syndrome: Secondary | ICD-10-CM | POA: Diagnosis not present

## 2019-04-11 DIAGNOSIS — F339 Major depressive disorder, recurrent, unspecified: Secondary | ICD-10-CM | POA: Diagnosis not present

## 2019-04-22 ENCOUNTER — Other Ambulatory Visit: Payer: Self-pay | Admitting: Family

## 2019-04-25 DIAGNOSIS — F339 Major depressive disorder, recurrent, unspecified: Secondary | ICD-10-CM | POA: Diagnosis not present

## 2019-05-06 DIAGNOSIS — L732 Hidradenitis suppurativa: Secondary | ICD-10-CM | POA: Diagnosis not present

## 2019-05-06 DIAGNOSIS — L0291 Cutaneous abscess, unspecified: Secondary | ICD-10-CM | POA: Diagnosis not present

## 2019-05-06 DIAGNOSIS — L304 Erythema intertrigo: Secondary | ICD-10-CM | POA: Diagnosis not present

## 2019-05-06 DIAGNOSIS — L280 Lichen simplex chronicus: Secondary | ICD-10-CM | POA: Diagnosis not present

## 2019-06-04 DIAGNOSIS — L732 Hidradenitis suppurativa: Secondary | ICD-10-CM | POA: Diagnosis not present

## 2019-06-04 DIAGNOSIS — G894 Chronic pain syndrome: Secondary | ICD-10-CM | POA: Diagnosis not present

## 2019-06-04 DIAGNOSIS — M542 Cervicalgia: Secondary | ICD-10-CM | POA: Diagnosis not present

## 2019-06-04 DIAGNOSIS — M545 Low back pain: Secondary | ICD-10-CM | POA: Diagnosis not present

## 2019-06-24 ENCOUNTER — Other Ambulatory Visit: Payer: Self-pay | Admitting: Family

## 2019-08-01 DIAGNOSIS — G894 Chronic pain syndrome: Secondary | ICD-10-CM | POA: Diagnosis not present

## 2019-08-01 DIAGNOSIS — M542 Cervicalgia: Secondary | ICD-10-CM | POA: Diagnosis not present

## 2019-08-01 DIAGNOSIS — L732 Hidradenitis suppurativa: Secondary | ICD-10-CM | POA: Diagnosis not present

## 2019-08-01 DIAGNOSIS — M545 Low back pain: Secondary | ICD-10-CM | POA: Diagnosis not present

## 2019-10-10 DIAGNOSIS — L732 Hidradenitis suppurativa: Secondary | ICD-10-CM | POA: Diagnosis not present

## 2019-10-10 DIAGNOSIS — G894 Chronic pain syndrome: Secondary | ICD-10-CM | POA: Diagnosis not present

## 2019-10-10 DIAGNOSIS — M542 Cervicalgia: Secondary | ICD-10-CM | POA: Diagnosis not present

## 2019-10-10 DIAGNOSIS — M545 Low back pain: Secondary | ICD-10-CM | POA: Diagnosis not present

## 2019-10-22 ENCOUNTER — Other Ambulatory Visit: Payer: Self-pay | Admitting: Family

## 2019-11-22 ENCOUNTER — Other Ambulatory Visit: Payer: Self-pay | Admitting: Family

## 2019-11-22 MED ORDER — PANTOPRAZOLE SODIUM 40 MG PO TBEC: DELAYED_RELEASE_TABLET | ORAL | 0 refills | Status: DC

## 2019-12-19 ENCOUNTER — Other Ambulatory Visit: Payer: Self-pay | Admitting: Family

## 2020-04-03 LAB — HM PAP SMEAR

## 2020-04-03 LAB — RESULTS CONSOLE HPV: CHL HPV: NEGATIVE

## 2020-08-10 ENCOUNTER — Ambulatory Visit (INDEPENDENT_AMBULATORY_CARE_PROVIDER_SITE_OTHER): Payer: Managed Care, Other (non HMO) | Admitting: Family

## 2020-08-10 ENCOUNTER — Other Ambulatory Visit: Payer: Self-pay

## 2020-08-10 VITALS — BP 126/82 | HR 80 | Temp 98.4°F | Resp 16 | Ht 69.0 in | Wt 256.0 lb

## 2020-08-10 DIAGNOSIS — K219 Gastro-esophageal reflux disease without esophagitis: Secondary | ICD-10-CM

## 2020-08-10 DIAGNOSIS — B370 Candidal stomatitis: Secondary | ICD-10-CM

## 2020-08-10 MED ORDER — FLUCONAZOLE 100 MG PO TABS: 100.0000 mg | ORAL_TABLET | Freq: Every day | ORAL | 0 refills | Status: DC

## 2020-08-10 MED ORDER — PANTOPRAZOLE SODIUM 40 MG PO TBEC: 40.0000 mg | DELAYED_RELEASE_TABLET | Freq: Every day | ORAL | 3 refills | Status: DC

## 2020-08-10 NOTE — Progress Notes (Signed)
Tara Ward is a 40 y.o. female with the following history as recorded in EpicCare:  Patient Active Problem List   Diagnosis Date Noted  . Upper respiratory tract infection 08/22/2017  . Encounter for general adult medical examination with abnormal findings 08/21/2013  . Chronic upper back pain 07/31/2013  . Pilonidal cyst with abscess 05/11/2013  . Lichen simplex chronicus 11/09/2011  . Benign positional vertigo 02/10/2011  . Eustachian tube dysfunction 02/10/2011  . OBESITY 02/22/2010  . TOBACCO USE 02/22/2010  . DEPRESSION 02/22/2010  . PEPTIC ULCER DISEASE 02/22/2010  . HIDRADENITIS SUPPURATIVA 02/22/2010    Current Outpatient Medications  Medication Sig Dispense Refill  . buPROPion (WELLBUTRIN XL) 300 MG 24 hr tablet Take 300 mg by mouth daily.    . clindamycin (CLINDAGEL) 1 % gel Apply topically daily.    Marland Kitchen estradiol (VIVELLE-DOT) 0.1 MG/24HR patch 1 patch every 3 (three) days.    . fluconazole (DIFLUCAN) 100 MG tablet Take 1 tablet (100 mg total) by mouth daily. 7 tablet 0  . oxyCODONE-acetaminophen (PERCOCET) 7.5-325 MG tablet Take 1 tablet by mouth 3 (three) times daily.    . pantoprazole (PROTONIX) 40 MG tablet Take 1 tablet (40 mg total) by mouth daily. 30 tablet 3  . traMADol (ULTRAM) 50 MG tablet Take 50 mg by mouth 2 (two) times daily as needed for moderate pain.  (Patient not taking: Reported on 08/10/2020)    . traMADol (ULTRAM-ER) 300 MG 24 hr tablet Take 300 mg by mouth every morning.  (Patient not taking: Reported on 08/10/2020)     No current facility-administered medications for this visit.    Allergies: Doxycycline and Lamisil [terbinafine hcl]  Past Medical History:  Diagnosis Date  . Anxiety   . Condyloma acuminata   . Depression   . Hidradenitis suppurativa    recurrent  . Peptic ulcer disease   . Pilonidal cyst     Past Surgical History:  Procedure Laterality Date  . TUBAL LIGATION  2004    Family History  Problem Relation Age of Onset  .  Hyperthyroidism Mother   . Diabetes Father   . Cancer Other        lung  . Diabetes Other        1st degree relative  . Cancer Paternal Uncle        gastric    Social History   Tobacco Use  . Smoking status: Current Every Day Smoker    Packs/day: 1.00    Years: 15.00    Pack years: 15.00    Types: Cigarettes    Last attempt to quit: 07/12/2015    Years since quitting: 5.0  . Smokeless tobacco: Never Used  Substance Use Topics  . Alcohol use: Yes    Alcohol/week: 0.0 standard drinks    Comment: socially     Subjective:   Patient complaining of "belching/" tongue irritation" x 1-2 weeks; going through IVF process and has recently stopped smoking; has taken Z-pak recently but thinks symptoms were present before she took the antibiotic; Used OTC Prilosec with some benefit;  LMP- recent/ denies any chance of being pregnant; No abdominal pain or changes in her bowel movements; no vomiting;    Objective:  Vitals:   08/10/20 0858  BP: 126/82  Pulse: 80  Resp: 16  Temp: 98.4 F (36.9 C)  TempSrc: Oral  Weight: 256 lb (116.1 kg)  Height: 5\' 9"  (1.753 m)    General: Well developed, well nourished, in no acute distress  Skin : Warm and dry.  Head: Normocephalic and atraumatic  Eyes: Sclera and conjunctiva clear; pupils round and reactive to light; extraocular movements intact  Ears: External normal; canals clear; tympanic membranes normal  Oropharynx: Pink, supple. No suspicious lesions  Neck: Supple without thyromegaly, adenopathy  Lungs: Respirations unlabored;  Neurologic: Alert and oriented; speech intact; face symmetrical; moves all extremities well; CNII-XII intact without focal deficit  Assessment:   1. Oral thrush   2. Gastroesophageal reflux disease, unspecified whether esophagitis present     Plan:  1. Coating noted on tongue; will go ahead and treat for thrush with oral diflucan; she will discuss with her IVF provider and let me know if we need to change  regimen; 2. Start Protonix 40 mg daily; okay to use x 2 weeks; may need to refer to GI;  This visit occurred during the SARS-CoV-2 public health emergency.  Safety protocols were in place, including screening questions prior to the visit, additional usage of staff PPE, and extensive cleaning of exam room while observing appropriate contact time as indicated for disinfecting solutions.     No follow-ups on file.  No orders of the defined types were placed in this encounter.   Requested Prescriptions   Signed Prescriptions Disp Refills  . fluconazole (DIFLUCAN) 100 MG tablet 7 tablet 0    Sig: Take 1 tablet (100 mg total) by mouth daily.  . pantoprazole (PROTONIX) 40 MG tablet 30 tablet 3    Sig: Take 1 tablet (40 mg total) by mouth daily.

## 2020-08-20 ENCOUNTER — Encounter: Payer: Self-pay | Admitting: Family

## 2020-08-21 ENCOUNTER — Other Ambulatory Visit: Payer: Self-pay | Admitting: Family

## 2020-08-21 DIAGNOSIS — Q383 Other congenital malformations of tongue: Secondary | ICD-10-CM

## 2020-08-27 ENCOUNTER — Other Ambulatory Visit: Payer: Self-pay | Admitting: Family

## 2020-08-27 DIAGNOSIS — K219 Gastro-esophageal reflux disease without esophagitis: Secondary | ICD-10-CM

## 2020-09-11 ENCOUNTER — Other Ambulatory Visit: Payer: Self-pay | Admitting: Family

## 2020-09-11 MED ORDER — FLUCONAZOLE 100 MG PO TABS: 100.0000 mg | ORAL_TABLET | Freq: Every day | ORAL | 0 refills | Status: DC

## 2020-10-12 ENCOUNTER — Encounter: Payer: Self-pay | Admitting: Internal Medicine

## 2020-10-12 ENCOUNTER — Ambulatory Visit: Payer: Managed Care, Other (non HMO) | Admitting: Internal Medicine

## 2020-10-12 VITALS — BP 110/72 | HR 92 | Ht 69.0 in | Wt 269.8 lb

## 2020-10-12 DIAGNOSIS — K219 Gastro-esophageal reflux disease without esophagitis: Secondary | ICD-10-CM

## 2020-10-12 NOTE — Patient Instructions (Signed)
Please follow up as needed 

## 2020-10-12 NOTE — Progress Notes (Signed)
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  The patient has been undergoing in vitro fertilization.  As part of this she received antibiotics.  Subsequently developed thrush which was treated with Diflucan.  She tells me that she stopped smoking in January.  After that she developed sounds like waterbrash.  She is now 1 month pregnant.  She was placed on pantoprazole which helped some of her symptoms.  She now complains of dry mouth and issues with belching.  No dysphagia.  She does have bloating and gas.  She has completed her COVID vaccination series.  She did undergo abdominal ultrasound in September 2020 to evaluate aminal pain.  This was normal.  She has been taking probiotic.  No prior GI history or investigations.  She has completed her COVID vaccination series and booster  REVIEW OF SYSTEMS:  All non-GI ROS negative unless otherwise stated in the HPI except for anxiety, fatigue, muscle cramps, excessive urination  Past Medical History:  Diagnosis Date  . Anxiety   . Condyloma acuminata   . Depression   . Hidradenitis suppurativa    recurrent  . Peptic ulcer disease   . Pilonidal cyst     Past Surgical History:  Procedure Laterality Date  . TUBAL LIGATION  2004    Social History Tara Ward  reports that she quit smoking about 5 years ago. Her smoking use included cigarettes. She has a 15.00 pack-year smoking history. She has never used smokeless tobacco. She reports previous alcohol use. She reports that she does not use drugs.  family history includes Diabetes in her father and another family member; Hyperthyroidism in her mother; Lung cancer in an other family member; Stomach cancer in her paternal uncle.  Allergies  Allergen Reactions  . Doxycycline Swelling    SWELLING IN OPTIC NERVE.  Marland Kitchen  Lamisil [Terbinafine Hcl]     Nausea, GERD       PHYSICAL EXAMINATION: Vital signs: BP 110/72   Pulse 92   Ht 5\' 9"  (1.753 m)   Wt 269 lb 12.8 oz (122.4 kg)   SpO2 99%   BMI 39.84 kg/m   Constitutional: generally well-appearing, no acute distress Psychiatric: alert and oriented x3, cooperative Eyes: extraocular movements intact, anicteric, conjunctiva pink Mouth: oral pharynx moist, no lesions.  No thrush Neck: supple no lymphadenopathy Cardiovascular: heart regular rate and rhythm, no murmur Lungs: clear to auscultation bilaterally Abdomen: soft, obese, nontender, nondistended, no obvious ascites, no peritoneal signs, normal bowel sounds, no organomegaly Rectal: Omitted Extremities: no clubbing, cyanosis, or lower extremity edema bilaterally Skin: no lesions on visible extremities Neuro: No focal deficits.  Cranial nerves intact  ASSESSMENT:  1.  GERD without alarm features.  Principally waterbrash. 2.  History of thrush after antibiotics.  Status posttreatment with Diflucan 3.  1 month pregnant 4.  Obesity  PLAN:  1.  Reflux precautions with attention to weight loss.  We discussed this 2.  Continue pantoprazole 40 mg daily.  Take in the morning 30 to 60 minutes before breakfast 3.  No indication for endoscopy 4.  Could increase PPI for period of time if symptoms return or persist to the bothersome level 5.  Resume general medical care with PCP.  Follow-up as needed

## 2020-10-23 ENCOUNTER — Other Ambulatory Visit: Payer: Self-pay

## 2020-10-23 ENCOUNTER — Encounter (HOSPITAL_COMMUNITY): Payer: Self-pay | Admitting: Obstetrics & Gynecology

## 2020-10-23 ENCOUNTER — Inpatient Hospital Stay (HOSPITAL_COMMUNITY)
Admission: AD | Admit: 2020-10-23 | Discharge: 2020-10-24 | Disposition: A | Discharge status: Home / Self Care | Payer: Managed Care, Other (non HMO) | Attending: Obstetrics & Gynecology | Admitting: Obstetrics & Gynecology

## 2020-10-23 ENCOUNTER — Inpatient Hospital Stay (HOSPITAL_COMMUNITY): Payer: Managed Care, Other (non HMO)

## 2020-10-23 DIAGNOSIS — Z79899 Other long term (current) drug therapy: Secondary | ICD-10-CM | POA: Insufficient documentation

## 2020-10-23 DIAGNOSIS — O209 Hemorrhage in early pregnancy, unspecified: Secondary | ICD-10-CM | POA: Insufficient documentation

## 2020-10-23 DIAGNOSIS — Z87891 Personal history of nicotine dependence: Secondary | ICD-10-CM | POA: Insufficient documentation

## 2020-10-23 DIAGNOSIS — Z3A01 Less than 8 weeks gestation of pregnancy: Secondary | ICD-10-CM | POA: Diagnosis not present

## 2020-10-23 NOTE — MAU Note (Signed)
Dr Barb Merino in Family Rm to talk with pt and her husband

## 2020-10-23 NOTE — MAU Provider Note (Signed)
Chief Complaint: Vaginal Bleeding in s/o IVF Pregnancy  SUBJECTIVE HPI: Tara Ward is a 40 y.o. G1P0 at [redacted]w[redacted]d by early ultrasound who presents to maternity admissions reporting concern of worsening vaginal bleeding since 2145 this evening. Pt initially began having light bleeding this morning and was subsequently seen at her fertility clinic [facility]). At her clinic visit today, ultrasound showed IUP with normal heartbeat. Given ongoing bleeding this evening, her fertility physician recommended an additional dose of IM progesterone, which she has already administered. Pt presented with her husband to MAU this evening given worsening bleeding, specifically pt had 1 large clot in the toilet today and continues to have spotting that does not saturate a pad. No recent vaginal intercourse. She denies vaginal itching/burning, urinary symptoms, h/a, dizziness, n/v, or fever/chills. Pt strongly desires an ultrasound tonight to confirm whether or not a miscarriage has occurred.  Past Medical History:  Diagnosis Date  . Anxiety   . Condyloma acuminata   . Depression   . Hidradenitis suppurativa    recurrent  . Peptic ulcer disease   . Pilonidal cyst    Past Surgical History:  Procedure Laterality Date  . TUBAL LIGATION  2004   Social History   Socioeconomic History  . Marital status: Married    Spouse name: Not on file  . Number of children: 0  . Years of education: 51  . Highest education level: Not on file  Occupational History  . Occupation: Lawyer, Research officer, political party: [employer]  Tobacco Use  . Smoking status: Former Smoker    Packs/day: 1.00    Years: 15.00    Pack years: 15.00    Types: Cigarettes    Quit date: 07/12/2015    Years since quitting: 5.2  . Smokeless tobacco: Never Used  Vaping Use  . Vaping Use: Never used  Substance and Sexual Activity  . Alcohol use: Not Currently    Alcohol/week: 0.0 standard drinks  . Drug use: No  . Sexual activity: Not  Currently  Other Topics Concern  . Not on file  Social History Narrative   No regular excerise   Fun: Spend time with family.   Denies religious beliefs that would effect healthcare.    Social Determinants of Health   Financial Resource Strain: Not on file  Food Insecurity: Not on file  Transportation Needs: Not on file  Physical Activity: Not on file  Stress: Not on file  Social Connections: Not on file  Intimate Partner Violence: Not on file   No current facility-administered medications on file prior to encounter.   Current Outpatient Medications on File Prior to Encounter  Medication Sig Dispense Refill  . Aspirin 81 MG CAPS Take 1 tablet by mouth daily.    Marland Kitchen buPROPion (WELLBUTRIN XL) 300 MG 24 hr tablet Take 300 mg by mouth daily.    Marland Kitchen estradiol (ESTRACE) 2 MG tablet Take 2 mg by mouth 2 (two) times daily.    Marland Kitchen estradiol (VIVELLE-DOT) 0.1 MG/24HR patch 1 patch every 3 (three) days.    Marland Kitchen oxycodone (OXY-IR) 5 MG capsule Take 5 mg by mouth every 6 (six) hours.    . pantoprazole (PROTONIX) 40 MG tablet Take 1 tablet (40 mg total) by mouth daily. 30 tablet 3  . Prenatal Vit-Fe Fumarate-FA (MULTIVITAMIN-PRENATAL) 27-0.8 MG TABS tablet Take 1 tablet by mouth daily at 12 noon.    Marland Kitchen PROGESTERONE IM Inject 1 mL into the muscle every morning.    . Probiotic  Product (PROBIOTIC-10 PO) Take 1 tablet by mouth daily.    . Vitamin D, Ergocalciferol, (DRISDOL) 1.25 MG (50000 UNIT) CAPS capsule Take 10,000 Units by mouth daily.     Allergies  Allergen Reactions  . Doxycycline Swelling    SWELLING IN OPTIC NERVE.  Marland Kitchen Lamisil [Terbinafine Hcl]     Nausea, GERD    ROS:  Review of Systems  Constitutional: Negative for activity change, chills, fatigue and fever.  HENT: Negative for congestion and sore throat.   Eyes: Negative for photophobia and visual disturbance.  Respiratory: Negative for cough and shortness of breath.   Cardiovascular: Negative for chest pain.  Gastrointestinal:  Negative for abdominal pain, constipation, diarrhea, nausea and vomiting.  Endocrine: Negative for polyuria.  Genitourinary: Positive for vaginal bleeding. Negative for dysuria, pelvic pain, vaginal discharge and vaginal pain.  Neurological: Negative for headaches.   I have reviewed patient's Past Medical Hx, Surgical Hx, Family Hx, Social Hx, medications and allergies.   Physical Exam   Patient Vitals for the past 24 hrs:  BP Temp Pulse Resp Height Weight  10/24/20 0151 122/74 -- 95 16 -- --  10/23/20 2233 115/70 98.1 F (36.7 C) 98 18 5\' 9"  (1.753 m) 124.7 kg   Constitutional: Well-developed, well-nourished female in no acute distress.  Cardiovascular: normal rate Respiratory: normal effort GI: Abd soft, non-tender. Pos BS x 4 MS: Extremities nontender, no edema, normal ROM Neurologic: Alert and oriented x 4.  Psych: anxious appearing  PELVIC EXAM: Cervix pink, visually closed, without lesion, scant clear discharge, vaginal walls and external genitalia normal, minimal bright red blood without clots visible in vaginal vault. Bimanual exam: deferred  FHT 90 on formal transvaginal ultrasound  LAB RESULTS No results found for this or any previous visit (from the past 24 hour(s)).     IMAGING OB LESS THAN 14 WEEKS WITH OB TRANSVAGINAL  Result Date: 10/24/2020 CLINICAL DATA:  Bleeding, IVF, quantitative hCG not drawn EXAM: OBSTETRIC <14 WK 10/26/2020 AND TRANSVAGINAL OB US TECHNIQUE: Both transabdominal and transvaginal ultrasound examinations were performed for complete evaluation of the gestation as well as the maternal uterus, adnexal regions, and pelvic cul-de-sac. Transvaginal technique was performed to assess early pregnancy. COMPARISON:  None. FINDINGS: Intrauterine gestational sac: Single. Slightly irregular, lobular contours. Yolk sac:  Visualized. Embryo:  Visualized. Cardiac Activity: Visualized. Heart Rate: 90 bpm CRL:  3.2 mm   5 w   6 d                  [place] EDC: 06/19/2021  Subchorionic hemorrhage:  None visualized. Maternal uterus/adnexae: Anteverted maternal uterus without acute complication. Technically challenging visualization of the fetal pole given location within the gestational sac. Normal appearance of the ovaries. No free pelvic fluid. IMPRESSION: Single intrauterine gestation at 5 weeks, 6 days gestational age by crown-rump length sonographic estimation. Low fetal heart rate (90 beats per minute) may be related to early gestational age though is overall nonspecific particularly given a slightly irregular contour of the gestational sac. Recommend cautious observation and short-term interval follow-up ultrasound with serial beta HCG measurement to ensure continued viability. Electronically Signed   By: 06/21/2021 M.D.   On: 10/24/2020 01:38    MAU Management/MDM: Orders Placed This Encounter  Procedures  . 10/26/2020 OB LESS THAN 14 WEEKS WITH OB TRANSVAGINAL  . Discharge patient Discharge disposition: 01-Home or Self Care; Discharge patient date: 10/24/2020    No orders of the defined types were placed in this encounter.  Discussed pt with Dr. Macon Large, [facility] Attending. Treatments in MAU included: none. Pt discharged with strict return precautions for worsening vaginal bleeding.  ASSESSMENT & PLAN Tara Ward is a 40 y.o. G1P0 at [redacted]w[redacted]d by early ultrasound who presents to maternity admissions reporting concern of vaginal bleeding x1 day in setting of highly desired pregnancy. Pt is followed by [place] for IVF and had an ultrasound showing IUP with normal FHR on 10/23/20, prior to presenting to MAU today. 1. Vaginal bleeding in pregnancy, first trimester   -Recommended close follow-up with [facility], especially given low fetal HR. Discussed with patient and her partner the potential for low fetal HR to be secondary to early pregnancy, but recommended close follow-up given the potential for early pregnancy loss. Pt and her partner endorsed  understanding of the plan and will call their clinic in the morning, later today on 4/30. -Discharge home with strict return precautions for worsening vaginal bleeding, severe abdominal bleeding or other concerns. Allergies as of 10/24/2020      Reactions   Doxycycline Swelling   SWELLING IN OPTIC NERVE.   Lamisil [terbinafine Hcl]    Nausea, GERD      Medication List    TAKE these medications   Aspirin 81 MG Caps Take 1 tablet by mouth daily.   buPROPion 300 MG 24 hr tablet Commonly known as: WELLBUTRIN XL Take 300 mg by mouth daily.   estradiol 0.1 MG/24HR patch Commonly known as: VIVELLE-DOT 1 patch every 3 (three) days.   estradiol 2 MG tablet Commonly known as: ESTRACE Take 2 mg by mouth 2 (two) times daily.   multivitamin-prenatal 27-0.8 MG Tabs tablet Take 1 tablet by mouth daily at 12 noon.   oxycodone 5 MG capsule Commonly known as: OXY-IR Take 5 mg by mouth every 6 (six) hours.   pantoprazole 40 MG tablet Commonly known as: PROTONIX Take 1 tablet (40 mg total) by mouth daily.   PROBIOTIC-10 PO Take 1 tablet by mouth daily.   PROGESTERONE IM Inject 1 mL into the muscle every morning.   Vitamin D (Ergocalciferol) 1.25 MG (50000 UNIT) Caps capsule Commonly known as: DRISDOL Take 10,000 Units by mouth daily.       Follow-up Information    [facility] Follow up.   Contact information: [address] [place] [address] [phone]              Sheila Oats, MD OB Fellow, Faculty Practice 10/24/2020 2:02 AM

## 2020-10-23 NOTE — MAU Note (Signed)
This is IVF pregnancy. Had some bleeding this am. Was seen at [facility] and had u/s. Saw heartbeat and was told everything was ok. Tonight had more bleeding with few clots. Mild aching in abd. Called Fertility doc tonight. Pt normally takes Progesterone shots every morning. Provider told her to take an additional shot tonight which pt did. Took 1cc Progesterone IM tonight

## 2020-10-24 ENCOUNTER — Encounter (HOSPITAL_COMMUNITY): Payer: Self-pay | Admitting: Obstetrics & Gynecology

## 2020-10-24 DIAGNOSIS — Z3A01 Less than 8 weeks gestation of pregnancy: Secondary | ICD-10-CM

## 2020-10-24 DIAGNOSIS — O209 Hemorrhage in early pregnancy, unspecified: Secondary | ICD-10-CM | POA: Diagnosis not present

## 2020-10-24 NOTE — Discharge Instructions (Signed)
Your speculum exam showed that your cervix is closed. Minimal vaginal bleeding was visualized at the external os (opening to the cervix). No sign of infection or irritation. Your transvaginal ultrasound showed an intrauterine pregnancy at approximately [redacted]w[redacted]d. A fetal heart beat was visualized. The heart rate was low (90 beats per minute). This finding may be secondary to early pregnancy; however, it will be important to have close follow-up with repeat ultrasound and lab monitoring with your fertility clinic.  Please call sooner if your vaginal bleeding increases or if you develop severe abdominal pain or other concerns.  Vaginal Bleeding During Pregnancy, First Trimester A small amount of bleeding from the vagina is common during early pregnancy. This kind of bleeding is also called spotting. Sometimes the bleeding is normal and does not cause problems. At other times, though, bleeding may be a sign of something serious. Normal bleeding in pregnancy can happen:  When the fertilized egg attaches itself to your womb.  When blood vessels change because of the pregnancy.  When you have pelvic exams.  When you have sex. Abnormal bleeding can happen:  When you have an infection.  When you have growths in your womb. The growths are called polyps.  If you are having a miscarriage or at risk of having one.  If you have other problems in your pregnancy. Tell your doctor right away about any bleeding from your vagina. Follow these instructions at home: Watch your bleeding  Watch your condition for any changes. Let your doctor know if you are worried about something.  Try to know what causes your bleeding. Ask yourself these questions: ? Does the bleeding start on its own? ? Does the bleeding start after something is done, such as sex or a pelvic exam?  Use a diary to write the things you see about your bleeding. Write in your diary: ? If the bleeding flows freely without stopping, or if it  starts and stops, and then starts again. ? If the bleeding is heavy or light. ? How many pads you use in a day and how much blood is in them.  Tell your doctor if you pass tissue. He or she may want to see it.   Activity  Follow your doctor's instructions about how active you can be. Ask what activities are safe for you.  Do not have sex or orgasms until your doctor says that this is safe.  If needed, make plans for someone to help with your normal activities. General instructions  Take over-the-counter and prescription medicines only as told by your doctor.  Do not take aspirin because it can cause bleeding.  Do not use tampons.  Do not douche.  Keep all follow-up visits. Contact a doctor if:  You have vaginal bleeding at any time while you are pregnant.  You have cramps.  You have a fever or chills. Get help right away if:  You have very bad cramps in your back or belly (abdomen).  You pass large clots or a lot of tissue from your vagina.  Your bleeding gets worse.  You feel light-headed.  You feel weak.  You pass out (faint).  You have chills.  You are leaking fluid from your vagina.  You have a gush of fluid from your vagina. Summary  Sometimes vaginal bleeding during pregnancy is normal and does not cause problems. At other times, bleeding may be a sign of something serious.  Tell your doctor right away about any bleeding from your vagina.  Follow your doctor's instructions about how active you can be. You may need someone to help you with your normal activities.  Keep all follow-up visits. This information is not intended to replace advice given to you by your health care provider. Make sure you discuss any questions you have with your health care provider. Document Revised: 03/05/2020 Document Reviewed: 03/05/2020 Elsevier Patient Education  2021 [employer].

## 2020-10-24 NOTE — Progress Notes (Signed)
Written and verbal d/c instructions given and understanding voiced. 

## 2020-10-24 NOTE — MAU Note (Signed)
Dr Barb Merino in with couple to discuss u/s results and discharge plan.

## 2020-11-11 LAB — HM HEPATITIS C SCREENING LAB: HM Hepatitis Screen: NEGATIVE

## 2020-11-11 LAB — HM HIV SCREENING LAB: HM HIV Screening: NEGATIVE

## 2020-11-13 ENCOUNTER — Other Ambulatory Visit: Payer: Self-pay | Admitting: Obstetrics and Gynecology

## 2020-11-25 ENCOUNTER — Other Ambulatory Visit: Payer: Self-pay | Admitting: Obstetrics and Gynecology

## 2020-11-29 ENCOUNTER — Other Ambulatory Visit: Payer: Self-pay | Admitting: Family

## 2020-12-08 ENCOUNTER — Encounter: Payer: Self-pay | Admitting: Obstetrics and Gynecology

## 2020-12-17 ENCOUNTER — Other Ambulatory Visit: Payer: Self-pay | Admitting: Obstetrics and Gynecology

## 2020-12-26 ENCOUNTER — Other Ambulatory Visit: Payer: Self-pay | Admitting: Obstetrics and Gynecology

## 2021-01-26 ENCOUNTER — Encounter: Payer: Self-pay | Admitting: Obstetrics and Gynecology

## 2021-01-26 ENCOUNTER — Other Ambulatory Visit: Payer: Self-pay | Admitting: Obstetrics and Gynecology

## 2021-01-26 ENCOUNTER — Encounter: Payer: Self-pay | Admitting: *Deleted

## 2021-01-26 ENCOUNTER — Other Ambulatory Visit: Payer: Self-pay

## 2021-01-26 ENCOUNTER — Ambulatory Visit (HOSPITAL_BASED_OUTPATIENT_CLINIC_OR_DEPARTMENT_OTHER): Payer: Managed Care, Other (non HMO) | Admitting: Obstetrics

## 2021-01-26 ENCOUNTER — Ambulatory Visit (HOSPITAL_BASED_OUTPATIENT_CLINIC_OR_DEPARTMENT_OTHER): Payer: Managed Care, Other (non HMO)

## 2021-01-26 ENCOUNTER — Ambulatory Visit: Payer: Managed Care, Other (non HMO) | Attending: Obstetrics and Gynecology | Admitting: *Deleted

## 2021-01-26 VITALS — BP 114/78 | HR 106

## 2021-01-26 DIAGNOSIS — O283 Abnormal ultrasonic finding on antenatal screening of mother: Secondary | ICD-10-CM

## 2021-01-26 DIAGNOSIS — O35EXX0 Maternal care for other (suspected) fetal abnormality and damage, fetal genitourinary anomalies, not applicable or unspecified: Secondary | ICD-10-CM

## 2021-01-26 DIAGNOSIS — O4102X1 Oligohydramnios, second trimester, fetus 1: Secondary | ICD-10-CM

## 2021-01-26 DIAGNOSIS — Z3A19 19 weeks gestation of pregnancy: Secondary | ICD-10-CM

## 2021-01-26 DIAGNOSIS — O4102X0 Oligohydramnios, second trimester, not applicable or unspecified: Secondary | ICD-10-CM

## 2021-01-26 DIAGNOSIS — O09529 Supervision of elderly multigravida, unspecified trimester: Secondary | ICD-10-CM

## 2021-01-26 DIAGNOSIS — O09522 Supervision of elderly multigravida, second trimester: Secondary | ICD-10-CM

## 2021-01-26 DIAGNOSIS — O358XX0 Maternal care for other (suspected) fetal abnormality and damage, not applicable or unspecified: Secondary | ICD-10-CM

## 2021-01-26 DIAGNOSIS — Z3689 Encounter for other specified antenatal screening: Secondary | ICD-10-CM

## 2021-01-26 DIAGNOSIS — O09292 Supervision of pregnancy with other poor reproductive or obstetric history, second trimester: Secondary | ICD-10-CM | POA: Diagnosis not present

## 2021-01-26 NOTE — Progress Notes (Signed)
MFM Note  Tara Ward was seen for a detailed ultrasound and consultation due to anhydramnios and enlarged kidneys that were noted on an ultrasound exam performed in your office earlier today.  She is 40 year old a gravida 5 para 0 with a history of two ectopic pregnancies and a prior IUFD at 25 weeks.  She denies any leakage of fluid.  This is an IVF pregnancy.  She had a cell free DNA test earlier in her pregnancy which indicated a low risk for trisomy 8, 78, and 13.  She was informed that the fetal growth was appropriate for her gestational age.   Anhydramnios along with bilateral multicystic dysplastic kidneys were noted today. The right and left fetal kidneys appeared enlarged with cysts of varying sizes. I could not find a filled fetal bladder today.  The patient and her husband were advised that multicystic dysplastic kidney is a form of severe renal dysplasia that typically results in a nonfunctioning kidney.  When the condition is bilateral, the amniotic fluid volume is generally severely decreased and the outcome of the pregnancy is generally poor due to pulmonary hypoplasia.  The couple were reassured that most cases of multicystic dysplastic kidneys are sporadic and the risk of recurrence in a subsequent pregnancy is low.  They were reassured that they did not do anything to have caused this finding.  Due to the poor prognosis associated with bilateral multicystic dysplastic kidneys with anhydramnios, a termination of pregnancy should be considered.    The risks versus benefits of procedures for termination of pregnancy (D&E versus a cytotec induction) were discussed.  They will make a decision soon regarding what they would like to do.  I will contact the physicians from the [facility] at [facility] to determine if they can help schedule an induction for her as soon as possible.    Should she undergo a termination of pregnancy, the products of conception should be sent to  [facility] for the Reveal Products of Conception MicroArray test (order code [phone]).  If the patient desires, she can have a genetic counseling session with our genetic counselor to discuss the risk of recurrence of multicystic dysplastic kidneys prior to conceiving her next pregnancy.    The patient stated that all of her questions have been answered.  A total of 30 minutes was spent counseling and coordinating the care for this patient.  Greater than 50% of the time was spent in direct face-to-face contact.  Recommendations:  Consider termination of pregnancy Send products of conception for the [facility] Reveal Products of Conception MicroArray test

## 2021-01-29 ENCOUNTER — Ambulatory Visit: Payer: Managed Care, Other (non HMO) | Admitting: Family

## 2021-02-09 ENCOUNTER — Ambulatory Visit: Payer: Managed Care, Other (non HMO)

## 2021-03-28 ENCOUNTER — Other Ambulatory Visit: Payer: Self-pay | Admitting: Family

## 2021-04-09 ENCOUNTER — Other Ambulatory Visit: Payer: Self-pay

## 2021-04-09 ENCOUNTER — Encounter: Payer: Managed Care, Other (non HMO) | Admitting: Family

## 2021-04-09 ENCOUNTER — Encounter: Payer: Self-pay | Admitting: Family

## 2021-04-09 ENCOUNTER — Ambulatory Visit (INDEPENDENT_AMBULATORY_CARE_PROVIDER_SITE_OTHER): Payer: Managed Care, Other (non HMO) | Admitting: Family

## 2021-04-09 VITALS — BP 130/70 | HR 96 | Temp 98.3°F | Ht 69.0 in | Wt 291.8 lb

## 2021-04-09 DIAGNOSIS — F419 Anxiety disorder, unspecified: Secondary | ICD-10-CM | POA: Diagnosis not present

## 2021-04-09 DIAGNOSIS — K219 Gastro-esophageal reflux disease without esophagitis: Secondary | ICD-10-CM | POA: Diagnosis not present

## 2021-04-09 DIAGNOSIS — F32A Depression, unspecified: Secondary | ICD-10-CM | POA: Diagnosis not present

## 2021-04-09 MED ORDER — PANTOPRAZOLE SODIUM 40 MG PO TBEC: 40.0000 mg | DELAYED_RELEASE_TABLET | Freq: Two times a day (BID) | ORAL | 3 refills | Status: DC

## 2021-04-09 MED ORDER — SERTRALINE HCL 50 MG PO TABS: ORAL_TABLET | ORAL | 1 refills | Status: DC

## 2021-04-09 MED ORDER — BUPROPION HCL ER (XL) 150 MG PO TB24: 150.0000 mg | ORAL_TABLET | Freq: Every day | ORAL | 0 refills | Status: DC

## 2021-04-09 NOTE — Progress Notes (Signed)
Tara Ward is a 40 y.o. female with the following history as recorded in EpicCare:  Patient Active Problem List   Diagnosis Date Noted   Upper respiratory tract infection 08/22/2017   Encounter for general adult medical examination with abnormal findings 08/21/2013   Chronic upper back pain 07/31/2013   Pilonidal cyst with abscess 05/11/2013   Lichen simplex chronicus 11/09/2011   Benign positional vertigo 02/10/2011   Eustachian tube dysfunction 02/10/2011   OBESITY 02/22/2010   TOBACCO USE 02/22/2010   DEPRESSION 02/22/2010   PEPTIC ULCER DISEASE 02/22/2010   HIDRADENITIS SUPPURATIVA 02/22/2010    Current Outpatient Medications  Medication Sig Dispense Refill   oxycodone (OXY-IR) 5 MG capsule Take 5 mg by mouth every 6 (six) hours.     Prenatal Vit-Fe Fumarate-FA (MULTIVITAMIN-PRENATAL) 27-0.8 MG TABS tablet Take 1 tablet by mouth daily at 12 noon.     sertraline (ZOLOFT) 50 MG tablet Take 1/2 tablet daily x 1 week; then increase to qd 30 tablet 1   buPROPion (WELLBUTRIN XL) 150 MG 24 hr tablet Take 1 tablet (150 mg total) by mouth daily. 90 tablet 0   pantoprazole (PROTONIX) 40 MG tablet Take 1 tablet (40 mg total) by mouth 2 (two) times daily before a meal. 180 tablet 3   No current facility-administered medications for this visit.    Allergies: Doxycycline and Lamisil [terbinafine hcl]  Past Medical History:  Diagnosis Date   Anxiety    Condyloma acuminata    Depression    Hidradenitis suppurativa    recurrent   Peptic ulcer disease    Pilonidal cyst     Past Surgical History:  Procedure Laterality Date   TUBAL LIGATION  2004    Family History  Problem Relation Age of Onset   Hyperthyroidism Mother    Diabetes Father    Diabetes Other        1st degree relative   Lung cancer Other    Stomach cancer Paternal Uncle    Esophageal cancer Neg Hx    Pancreatic cancer Neg Hx    Liver disease Neg Hx    Colon cancer Neg Hx     Social History   Tobacco Use    Smoking status: Former    Packs/day: 1.00    Years: 15.00    Pack years: 15.00    Types: Cigarettes    Quit date: 07/12/2015    Years since quitting: 5.7   Smokeless tobacco: Never  Substance Use Topics   Alcohol use: Not Currently    Alcohol/week: 0.0 standard drinks    Subjective:  Medication follow up/ discussion regarding Wellbutrin XL;  Requesting refill on Protonix- admits GERD has been flared up more recently; improved during pregnancy but has worsened since loss of child earlier this summer; Anxiety/ depression worse recently- is in therapy; is taking Wellbutrin per her GYN- wants to quit smoking again;    Objective:  Vitals:   04/09/21 1510  BP: 130/70  Pulse: 96  Temp: 98.3 F (36.8 C)  TempSrc: Oral  SpO2: 94%  Weight: 291 lb 12.8 oz (132.4 kg)  Height: 5\' 9"  (1.753 m)    General: Well developed, well nourished, in no acute distress  Skin : Warm and dry.  Head: Normocephalic and atraumatic  Eyes: Sclera and conjunctiva clear; pupils round and reactive to light; extraocular movements intact  Ears: External normal; canals clear; tympanic membranes normal  Oropharynx: Pink, supple. No suspicious lesions  Neck: Supple without thyromegaly, adenopathy  Lungs: Respirations  unlabored;  Neurologic: Alert and oriented; speech intact; face symmetrical; moves all extremities well; CNII-XII intact without focal deficit   Assessment:  1. Gastroesophageal reflux disease, unspecified whether esophagitis present   2. Anxiety and depression     Plan:  Refill on Protonix 40 mg- take bid x 2 weeks and then decrease back to once a day; will re-evaluate at next OV;  Continue Wellbutrin XL 150 mg daily; start Zoloft 50 mg daily- risks/ benefit discussed; she understands this is safe for pregnancy/ breast-feeding; re-evaluate in 1 month and may need to increase dosage;   This visit occurred during the SARS-CoV-2 public health emergency.  Safety protocols were in place, including  screening questions prior to the visit, additional usage of staff PPE, and extensive cleaning of exam room while observing appropriate contact time as indicated for disinfecting solutions.    Return in about 1 month (around 05/10/2021).  No orders of the defined types were placed in this encounter.   Requested Prescriptions   Signed Prescriptions Disp Refills   pantoprazole (PROTONIX) 40 MG tablet 180 tablet 3    Sig: Take 1 tablet (40 mg total) by mouth 2 (two) times daily before a meal.   sertraline (ZOLOFT) 50 MG tablet 30 tablet 1    Sig: Take 1/2 tablet daily x 1 week; then increase to qd   buPROPion (WELLBUTRIN XL) 150 MG 24 hr tablet 90 tablet 0    Sig: Take 1 tablet (150 mg total) by mouth daily.

## 2021-05-14 ENCOUNTER — Encounter: Payer: Self-pay | Admitting: Family

## 2021-05-14 ENCOUNTER — Telehealth (INDEPENDENT_AMBULATORY_CARE_PROVIDER_SITE_OTHER): Payer: Managed Care, Other (non HMO) | Admitting: Family

## 2021-05-14 ENCOUNTER — Other Ambulatory Visit: Payer: Self-pay

## 2021-05-14 VITALS — BP 117/79 | Ht 69.0 in

## 2021-05-14 DIAGNOSIS — R7303 Prediabetes: Secondary | ICD-10-CM | POA: Diagnosis not present

## 2021-05-14 DIAGNOSIS — F419 Anxiety disorder, unspecified: Secondary | ICD-10-CM | POA: Diagnosis not present

## 2021-05-14 DIAGNOSIS — Z720 Tobacco use: Secondary | ICD-10-CM

## 2021-05-14 DIAGNOSIS — F32A Depression, unspecified: Secondary | ICD-10-CM

## 2021-05-14 NOTE — Progress Notes (Signed)
Tara Ward is a 40 y.o. female with the following history as recorded in EpicCare:  Patient Active Problem List   Diagnosis Date Noted   Upper respiratory tract infection 08/22/2017   Encounter for general adult medical examination with abnormal findings 08/21/2013   Chronic upper back pain 07/31/2013   Pilonidal cyst with abscess 75/88/3254   Lichen simplex chronicus 11/09/2011   Benign positional vertigo 02/10/2011   Eustachian tube dysfunction 02/10/2011   OBESITY 02/22/2010   TOBACCO USE 02/22/2010   DEPRESSION 02/22/2010   PEPTIC ULCER DISEASE 02/22/2010   HIDRADENITIS SUPPURATIVA 02/22/2010    Current Outpatient Medications  Medication Sig Dispense Refill   buPROPion (WELLBUTRIN XL) 150 MG 24 hr tablet Take 1 tablet (150 mg total) by mouth daily. 90 tablet 0   oxycodone (OXY-IR) 5 MG capsule Take 5 mg by mouth every 6 (six) hours.     pantoprazole (PROTONIX) 40 MG tablet Take 1 tablet (40 mg total) by mouth 2 (two) times daily before a meal. 180 tablet 3   Prenatal Vit-Fe Fumarate-FA (MULTIVITAMIN-PRENATAL) 27-0.8 MG TABS tablet Take 1 tablet by mouth daily at 12 noon.     sertraline (ZOLOFT) 50 MG tablet Take 1/2 tablet daily x 1 week; then increase to qd 30 tablet 1   No current facility-administered medications for this visit.    Allergies: Doxycycline and Lamisil [terbinafine hcl]  Past Medical History:  Diagnosis Date   Anxiety    Condyloma acuminata    Depression    Hidradenitis suppurativa    recurrent   Peptic ulcer disease    Pilonidal cyst     Past Surgical History:  Procedure Laterality Date   TUBAL LIGATION  2004    Family History  Problem Relation Age of Onset   Hyperthyroidism Mother    Diabetes Father    Diabetes Other        1st degree relative   Lung cancer Other    Stomach cancer Paternal Uncle    Esophageal cancer Neg Hx    Pancreatic cancer Neg Hx    Liver disease Neg Hx    Colon cancer Neg Hx     Social History   Tobacco Use    Smoking status: Every Day    Packs/day: 1.00    Years: 15.00    Pack years: 15.00    Types: Cigarettes    Last attempt to quit: 07/12/2015    Years since quitting: 5.8   Smokeless tobacco: Never  Substance Use Topics   Alcohol use: Not Currently    Alcohol/week: 0.0 standard drinks    Subjective:    I connected with [name] on 05/14/21 at  2:40 PM EST by a video enabled telemedicine application and verified that I am speaking with the correct person using two identifiers.   I discussed the limitations of evaluation and management by telemedicine and the availability of in person appointments. The patient expressed understanding and agreed to proceed. Provider in office/ patient is at home; provider and patient are only 2 people on video call.    Follow up on recent start of Zoloft and Wellbutrin; does feel that the combination is beneficial but has been having daily headaches for the past 1-2 weeks since reaching full dosage of Zoloft;   Has continued with GERD issues but admits she is not remembering to take her Protonix bid; did see GI in April 2022- MD did not feel endoscopy was warranted.  Would like to discuss weight loss drugs;  Objective:  Vitals:   05/14/21 1420  BP: 117/79  Height: 5' 9" (1.753 m)    General: Well developed, well nourished, in no acute distress  Skin : Warm and dry.  Head: Normocephalic and atraumatic  Lungs: Respirations unlabored;  Neurologic: Alert and oriented; speech intact; face symmetrical;   Assessment:  1. Pre-diabetes   2. Anxiety and depression   3. Tobacco abuse     Plan:  Patient will go get updated labs; to consider Saxenda or Wegovy; Decrease Zoloft back to 25 mg to see if headaches improve; if they persist, to consider changing to Lexapro; she will e-mail next week with her response. Will increase Wellbutrin XL to 300 mg once determine which SSRI she will use.  This visit occurred during the SARS-CoV-2 public health  emergency.  Safety protocols were in place, including screening questions prior to the visit, additional usage of staff PPE, and extensive cleaning of exam room while observing appropriate contact time as indicated for disinfecting solutions.    No follow-ups on file.  Orders Placed This Encounter  Procedures   CBC with Differential/Platelet    Standing Status:   Future    Standing Expiration Date:   05/14/2022   Comp Met (CMET)    Standing Status:   Future    Standing Expiration Date:   05/14/2022   Hemoglobin A1c    Standing Status:   Future    Standing Expiration Date:   05/14/2022    Requested Prescriptions    No prescriptions requested or ordered in this encounter

## 2021-05-18 ENCOUNTER — Encounter: Payer: Self-pay | Admitting: Family

## 2021-05-19 ENCOUNTER — Other Ambulatory Visit (INDEPENDENT_AMBULATORY_CARE_PROVIDER_SITE_OTHER): Payer: Managed Care, Other (non HMO)

## 2021-05-19 DIAGNOSIS — R7303 Prediabetes: Secondary | ICD-10-CM | POA: Diagnosis not present

## 2021-05-19 LAB — COMPREHENSIVE METABOLIC PANEL
ALT: 37 U/L — ABNORMAL HIGH (ref 0–35)
AST: 23 U/L (ref 0–37)
Albumin: 4.5 g/dL (ref 3.5–5.2)
Alkaline Phosphatase: 57 U/L (ref 39–117)
BUN: 11 mg/dL (ref 6–23)
CO2: 24 mEq/L (ref 19–32)
Calcium: 9.2 mg/dL (ref 8.4–10.5)
Chloride: 106 mEq/L (ref 96–112)
Creatinine, Ser: 0.73 mg/dL (ref 0.40–1.20)
GFR: 104.37 mL/min (ref >60.00)
Glucose, Bld: 114 mg/dL — ABNORMAL HIGH (ref 70–99)
Potassium: 4 mEq/L (ref 3.5–5.1)
Sodium: 138 mEq/L (ref 135–145)
Total Bilirubin: 0.3 mg/dL (ref 0.2–1.2)
Total Protein: 7.4 g/dL (ref 6.0–8.3)

## 2021-05-19 LAB — CBC WITH DIFFERENTIAL/PLATELET
Basophils Absolute: 0 10*3/uL (ref 0.0–0.1)
Basophils Relative: 0.6 % (ref 0.0–3.0)
Eosinophils Absolute: 0.2 10*3/uL (ref 0.0–0.7)
Eosinophils Relative: 3.2 % (ref 0.0–5.0)
HCT: 39.3 % (ref 36.0–46.0)
Hemoglobin: 12.5 g/dL (ref 12.0–15.0)
Lymphocytes Relative: 43.5 % (ref 12.0–46.0)
Lymphs Abs: 2.9 10*3/uL (ref 0.7–4.0)
MCHC: 31.7 g/dL (ref 30.0–36.0)
MCV: 81.2 fl (ref 78.0–100.0)
Monocytes Absolute: 0.5 10*3/uL (ref 0.1–1.0)
Monocytes Relative: 7.3 % (ref 3.0–12.0)
Neutro Abs: 3 10*3/uL (ref 1.4–7.7)
Neutrophils Relative %: 45.4 % (ref 43.0–77.0)
Platelets: 323 10*3/uL (ref 150.0–400.0)
RBC: 4.83 Mil/uL (ref 3.87–5.11)
RDW: 17.2 % — ABNORMAL HIGH (ref 11.5–15.5)
WBC: 6.7 10*3/uL (ref 4.0–10.5)

## 2021-05-19 LAB — HEMOGLOBIN A1C: Hgb A1c MFr Bld: 6.2 % (ref 4.6–6.5)

## 2021-05-25 ENCOUNTER — Encounter: Payer: Self-pay | Admitting: Family

## 2021-05-26 ENCOUNTER — Other Ambulatory Visit: Payer: Self-pay | Admitting: Family

## 2021-05-26 MED ORDER — CITALOPRAM HYDROBROMIDE 20 MG PO TABS: ORAL_TABLET | ORAL | 1 refills | Status: DC

## 2021-06-06 ENCOUNTER — Other Ambulatory Visit: Payer: Self-pay | Admitting: Family

## 2021-07-26 ENCOUNTER — Other Ambulatory Visit: Payer: Self-pay | Admitting: Family

## 2021-07-26 ENCOUNTER — Encounter: Payer: Self-pay | Admitting: Family

## 2021-07-28 ENCOUNTER — Other Ambulatory Visit: Payer: Self-pay | Admitting: Family

## 2021-07-28 MED ORDER — CITALOPRAM HYDROBROMIDE 20 MG PO TABS: 20.0000 mg | ORAL_TABLET | Freq: Every day | ORAL | 0 refills | Status: DC

## 2021-08-18 ENCOUNTER — Other Ambulatory Visit: Payer: Self-pay | Admitting: Family

## 2021-09-07 ENCOUNTER — Encounter: Payer: Self-pay | Admitting: Family

## 2021-09-17 ENCOUNTER — Ambulatory Visit: Payer: Managed Care, Other (non HMO) | Admitting: Family

## 2021-10-15 ENCOUNTER — Other Ambulatory Visit: Payer: Self-pay | Admitting: Family

## 2021-10-22 ENCOUNTER — Encounter: Payer: Self-pay | Admitting: Family

## 2021-10-22 ENCOUNTER — Ambulatory Visit: Payer: Managed Care, Other (non HMO) | Admitting: Family

## 2021-10-22 VITALS — BP 140/88 | HR 90 | Temp 98.4°F | Ht 70.0 in | Wt 311.2 lb

## 2021-10-22 DIAGNOSIS — R7303 Prediabetes: Secondary | ICD-10-CM | POA: Diagnosis not present

## 2021-10-22 DIAGNOSIS — Z6841 Body Mass Index (BMI) 40.0 and over, adult: Secondary | ICD-10-CM | POA: Diagnosis not present

## 2021-10-22 DIAGNOSIS — F32A Depression, unspecified: Secondary | ICD-10-CM

## 2021-10-22 DIAGNOSIS — F419 Anxiety disorder, unspecified: Secondary | ICD-10-CM | POA: Diagnosis not present

## 2021-10-22 MED ORDER — BUPROPION HCL ER (XL) 300 MG PO TB24: ORAL_TABLET | ORAL | 1 refills | Status: DC

## 2021-10-22 MED ORDER — WEGOVY 0.25 MG/0.5ML ~~LOC~~ SOAJ: 0.2500 mg | SUBCUTANEOUS | 0 refills | Status: DC

## 2021-10-22 MED ORDER — CITALOPRAM HYDROBROMIDE 20 MG PO TABS
20.0000 mg | ORAL_TABLET | Freq: Every day | ORAL | 1 refills | Status: DC
Start: 2021-10-22 — End: 2022-04-25

## 2021-10-22 NOTE — Progress Notes (Signed)
?Tara Ward is a 41 y.o. female with the following history as recorded in EpicCare:  ?Patient Active Problem List  ? Diagnosis Date Noted  ? Upper respiratory tract infection 08/22/2017  ? Encounter for general adult medical examination with abnormal findings 08/21/2013  ? Chronic upper back pain 07/31/2013  ? Pilonidal cyst with abscess 05/11/2013  ? Lichen simplex chronicus 11/09/2011  ? Benign positional vertigo 02/10/2011  ? Eustachian tube dysfunction 02/10/2011  ? OBESITY 02/22/2010  ? TOBACCO USE 02/22/2010  ? DEPRESSION 02/22/2010  ? PEPTIC ULCER DISEASE 02/22/2010  ? HIDRADENITIS SUPPURATIVA 02/22/2010  ?  ?Current Outpatient Medications  ?Medication Sig Dispense Refill  ? oxycodone (OXY-IR) 5 MG capsule Take 5 mg by mouth every 6 (six) hours.    ? pantoprazole (PROTONIX) 40 MG tablet Take 1 tablet (40 mg total) by mouth 2 (two) times daily before a meal. 180 tablet 3  ? Semaglutide-Weight Management (WEGOVY) 0.25 MG/0.5ML SOAJ Inject 0.25 mg into the skin once a week. 2 mL 0  ? buPROPion (WELLBUTRIN XL) 300 MG 24 hr tablet TAKE 1 TABLET BY MOUTH EVERY DAY IN THE MORNING 90 tablet 1  ? citalopram (CELEXA) 20 MG tablet Take 1 tablet (20 mg total) by mouth daily. 90 tablet 1  ? ?No current facility-administered medications for this visit.  ?  ?Allergies: Doxycycline and Lamisil [terbinafine hcl]  ?Past Medical History:  ?Diagnosis Date  ? Anxiety   ? Condyloma acuminata   ? Depression   ? Hidradenitis suppurativa   ? recurrent  ? Peptic ulcer disease   ? Pilonidal cyst   ?  ?Past Surgical History:  ?Procedure Laterality Date  ? TUBAL LIGATION  2004  ?  ?Family History  ?Problem Relation Age of Onset  ? Hyperthyroidism Mother   ? Diabetes Father   ? Diabetes Other   ?     1st degree relative  ? Lung cancer Other   ? Stomach cancer Paternal Uncle   ? Esophageal cancer Neg Hx   ? Pancreatic cancer Neg Hx   ? Liver disease Neg Hx   ? Colon cancer Neg Hx   ?  ?Social History  ? ?Tobacco Use  ? Smoking  status: Every Day  ?  Packs/day: 1.00  ?  Years: 15.00  ?  Pack years: 15.00  ?  Types: Cigarettes  ?  Last attempt to quit: 07/12/2015  ?  Years since quitting: 6.2  ? Smokeless tobacco: Never  ?Substance Use Topics  ? Alcohol use: Not Currently  ?  Alcohol/week: 0.0 standard drinks  ?  ?Subjective:  ? ?Would like to discuss weight loss drugs; wants to work on making lifestyle changes/ taking better care of her own health; feels like working on her weight would be best place to start; has done research on her own and would be comfortable trying Wegovy;  ? ?Feels like doing well on Celexa and Wellbutrin; does need refills; has re-started smoking- knows she needs to quit and will consider making that change at a later date;  ? ? ? ?Objective:  ?Vitals:  ? 10/22/21 1505 10/22/21 1636  ?BP: (!) 144/86 140/88  ?Pulse: 90   ?Temp: 98.4 ?F (36.9 ?C)   ?TempSrc: Oral   ?SpO2: 99%   ?Weight: (!) 311 lb 3.2 oz (141.2 kg)   ?Height: $RemoveB'5\' 10"'[id number]$  (1.778 m)   ?  ?General: Well developed, well nourished, in no acute distress  ?Skin : Warm and dry.  ?Head: Normocephalic and atraumatic  ?  Lungs: Respirations unlabored; clear to auscultation bilaterally without wheeze, rales, rhonchi  ?CVS exam: normal rate and regular rhythm.  ?Neurologic: Alert and oriented; speech intact; face symmetrical; moves all extremities well; CNII-XII intact without focal deficit  ? ?Assessment:  ?1. Pre-diabetes   ?2. BMI 40.0-44.9, adult ([place])   ?3. Anxiety and depression   ?  ?Plan:  ?Update labs today; Rx for Wegovy; risks and benefits of medication discussed; she will call back with her response in 4 weeks; ?3.   Stable; refills updated;  ? ?No follow-ups on file.  ?Orders Placed This Encounter  ?Procedures  ? CBC with Differential/Platelet  ? Comp Met (CMET)  ? Hemoglobin A1c  ?  ?Requested Prescriptions  ? ?Signed Prescriptions Disp Refills  ? Semaglutide-Weight Management (WEGOVY) 0.25 MG/0.5ML SOAJ 2 mL 0  ?  Sig: Inject 0.25 mg into the skin once a  week.  ? buPROPion (WELLBUTRIN XL) 300 MG 24 hr tablet 90 tablet 1  ?  Sig: TAKE 1 TABLET BY MOUTH EVERY DAY IN THE MORNING  ? citalopram (CELEXA) 20 MG tablet 90 tablet 1  ?  Sig: Take 1 tablet (20 mg total) by mouth daily.  ?  ? ?

## 2021-10-23 LAB — CBC WITH DIFFERENTIAL/PLATELET
Absolute Monocytes: 842 cells/uL (ref 200–950)
Basophils Absolute: 31 cells/uL (ref 0–200)
Basophils Relative: 0.3 %
Eosinophils Absolute: 260 cells/uL (ref 15–500)
Eosinophils Relative: 2.5 %
HCT: 35.2 % (ref 35.0–45.0)
Hemoglobin: 11.3 g/dL — ABNORMAL LOW (ref 11.7–15.5)
Lymphs Abs: 3578 cells/uL (ref 850–3900)
MCH: 27.6 pg (ref 27.0–33.0)
MCHC: 32.1 g/dL (ref 32.0–36.0)
MCV: 86.1 fL (ref 80.0–100.0)
MPV: 10.6 fL (ref 7.5–12.5)
Monocytes Relative: 8.1 %
Neutro Abs: 5689 cells/uL (ref 1500–7800)
Neutrophils Relative %: 54.7 %
Platelets: 349 10*3/uL (ref 140–400)
RBC: 4.09 10*6/uL (ref 3.80–5.10)
RDW: 12.9 % (ref 11.0–15.0)
Total Lymphocyte: 34.4 %
WBC: 10.4 10*3/uL (ref 3.8–10.8)

## 2021-10-23 LAB — COMPREHENSIVE METABOLIC PANEL
AG Ratio: 1.5 (calc) (ref 1.0–2.5)
ALT: 44 U/L — ABNORMAL HIGH (ref 6–29)
AST: 29 U/L (ref 10–30)
Albumin: 3.9 g/dL (ref 3.6–5.1)
Alkaline phosphatase (APISO): 59 U/L (ref 31–125)
BUN: 10 mg/dL (ref 7–25)
CO2: 24 mmol/L (ref 20–32)
Calcium: 9.2 mg/dL (ref 8.6–10.2)
Chloride: 105 mmol/L (ref 98–110)
Creat: 0.63 mg/dL (ref 0.50–0.97)
Globulin: 2.6 g/dL (calc) (ref 1.9–3.7)
Glucose, Bld: 96 mg/dL (ref 65–99)
Potassium: 4 mmol/L (ref 3.5–5.3)
Sodium: 140 mmol/L (ref 135–146)
Total Bilirubin: 0.2 mg/dL (ref 0.2–1.2)
Total Protein: 6.5 g/dL (ref 6.1–8.1)

## 2021-10-23 LAB — HEMOGLOBIN A1C
Hgb A1c MFr Bld: 6.1 % of total Hgb — ABNORMAL HIGH (ref <5.7)
Mean Plasma Glucose: 128 mg/dL
eAG (mmol/L): 7.1 mmol/L

## 2021-10-26 ENCOUNTER — Telehealth: Payer: Self-pay

## 2021-10-26 NOTE — Telephone Encounter (Signed)
Wegovy 0.25mg /0.5  ? ?Started the PA and got a ESI error code.  ?

## 2021-10-27 NOTE — Telephone Encounter (Signed)
PA initiated via [email or web address], PA EOC ID: [id number]. Awaiting determination.  ?

## 2021-10-28 NOTE — Telephone Encounter (Signed)
PA approved.  ? ?Search Results ?Drug/Service Name: Reginal Lutes 0.25 MG/0.5 ML PEN Physician/Nurse: Ria Clock ?EOC ID: [id number] Status: Approved ?Date Requested: 10/27/2021 12:44:58 Date Closed: 10/27/2021 18:10:24 ?Dispensing Location: Retail Pharm[facility]   ?Est.Time of Completion: N/A ?

## 2021-10-28 NOTE — Telephone Encounter (Signed)
PA approval called into [facility].  ?

## 2021-11-08 ENCOUNTER — Telehealth: Payer: Self-pay | Admitting: Family

## 2021-11-08 NOTE — Telephone Encounter (Signed)
Mackenzie called from [employer] regarding PA error. Reference key: [id number] # [id number]. Please advise  ?

## 2021-11-08 NOTE — Telephone Encounter (Signed)
Called them back and informed them that the PA was completed via a different way and approved. They archived it on their end.  ?

## 2021-11-22 ENCOUNTER — Other Ambulatory Visit: Payer: Self-pay | Admitting: Family

## 2021-11-23 ENCOUNTER — Other Ambulatory Visit: Payer: Self-pay | Admitting: Family

## 2021-11-23 ENCOUNTER — Encounter: Payer: Self-pay | Admitting: Family

## 2021-11-23 MED ORDER — WEGOVY 0.5 MG/0.5ML ~~LOC~~ SOAJ: 0.5000 mg | SUBCUTANEOUS | 0 refills | Status: DC

## 2021-12-30 ENCOUNTER — Other Ambulatory Visit: Payer: Self-pay | Admitting: Family

## 2021-12-30 ENCOUNTER — Encounter: Payer: Self-pay | Admitting: Family

## 2021-12-30 MED ORDER — WEGOVY 1 MG/0.5ML ~~LOC~~ SOAJ: 1.0000 mg | SUBCUTANEOUS | 0 refills | Status: DC

## 2022-01-14 ENCOUNTER — Ambulatory Visit: Payer: Managed Care, Other (non HMO) | Admitting: Family

## 2022-01-14 VITALS — BP 122/70 | HR 82 | Temp 97.6°F | Resp 18 | Ht 70.0 in | Wt 291.2 lb

## 2022-01-14 DIAGNOSIS — E538 Deficiency of other specified B group vitamins: Secondary | ICD-10-CM | POA: Diagnosis not present

## 2022-01-14 DIAGNOSIS — R7989 Other specified abnormal findings of blood chemistry: Secondary | ICD-10-CM | POA: Diagnosis not present

## 2022-01-14 DIAGNOSIS — M791 Myalgia, unspecified site: Secondary | ICD-10-CM | POA: Diagnosis not present

## 2022-01-14 DIAGNOSIS — R7303 Prediabetes: Secondary | ICD-10-CM | POA: Diagnosis not present

## 2022-01-14 NOTE — Progress Notes (Signed)
Tara Ward is a 41 y.o. female with the following history as recorded in EpicCare:  Patient Active Problem List   Diagnosis Date Noted   Upper respiratory tract infection 08/22/2017   Encounter for general adult medical examination with abnormal findings 08/21/2013   Chronic upper back pain 07/31/2013   Pilonidal cyst with abscess 05/11/2013   Lichen simplex chronicus 11/09/2011   Benign positional vertigo 02/10/2011   Eustachian tube dysfunction 02/10/2011   OBESITY 02/22/2010   TOBACCO USE 02/22/2010   DEPRESSION 02/22/2010   PEPTIC ULCER DISEASE 02/22/2010   HIDRADENITIS SUPPURATIVA 02/22/2010    Current Outpatient Medications  Medication Sig Dispense Refill   buPROPion (WELLBUTRIN XL) 300 MG 24 hr tablet TAKE 1 TABLET BY MOUTH EVERY DAY IN THE MORNING 90 tablet 1   citalopram (CELEXA) 20 MG tablet Take 1 tablet (20 mg total) by mouth daily. 90 tablet 1   oxycodone (OXY-IR) 5 MG capsule Take 5 mg by mouth every 6 (six) hours.     pantoprazole (PROTONIX) 40 MG tablet Take 1 tablet (40 mg total) by mouth 2 (two) times daily before a meal. 180 tablet 3   Semaglutide-Weight Management (WEGOVY) 1 MG/0.5ML SOAJ Inject 1 mg into the skin once a week. 2 mL 0   No current facility-administered medications for this visit.    Allergies: Doxycycline and Lamisil [terbinafine hcl]  Past Medical History:  Diagnosis Date   Anxiety    Condyloma acuminata    Depression    Hidradenitis suppurativa    recurrent   Peptic ulcer disease    Pilonidal cyst     Past Surgical History:  Procedure Laterality Date   TUBAL LIGATION  2004    Family History  Problem Relation Age of Onset   Hyperthyroidism Mother    Diabetes Father    Diabetes Other        1st degree relative   Lung cancer Other    Stomach cancer Paternal Uncle    Esophageal cancer Neg Hx    Pancreatic cancer Neg Hx    Liver disease Neg Hx    Colon cancer Neg Hx     Social History   Tobacco Use   Smoking status:  Every Day    Packs/day: 1.00    Years: 15.00    Total pack years: 15.00    Types: Cigarettes    Last attempt to quit: 07/12/2015    Years since quitting: 6.5   Smokeless tobacco: Never  Substance Use Topics   Alcohol use: Not Currently    Alcohol/week: 0.0 standard drinks of alcohol    Subjective:  Has been having some bilateral leg pain; noticed some discoloration on her thighs recently and was concerned that could have possible clots- discoloration is improved today;  Tolerating Wegovy well- will be getting ready to increase to 1 mg dosage;  Still smoking- trying to cut back but not completely ready to quit;     Objective:  Vitals:   01/14/22 1420  BP: 122/70  Pulse: 82  Resp: 18  Temp: 97.6 F (36.4 C)  TempSrc: Temporal  SpO2: 99%  Weight: 291 lb 3.2 oz (132.1 kg)  Height: 5' 10" (1.778 m)    General: Well developed, well nourished, in no acute distress  Skin : Warm and dry.  Head: Normocephalic and atraumatic  Eyes: Sclera and conjunctiva clear; pupils round and reactive to light; extraocular movements intact  Ears: External normal; canals clear; tympanic membranes normal  Oropharynx: Pink, supple. No suspicious   lesions  Neck: Supple without thyromegaly, adenopathy  Lungs: Respirations unlabored;  Musculoskeletal: No deformities; no active joint inflammation  Extremities: No edema, cyanosis, clubbing  Vessels: Symmetric bilaterally  Neurologic: Alert and oriented; speech intact; face symmetrical; moves all extremities well; CNII-XII intact without focal deficit   Assessment:  1. Myalgia   2. Low vitamin B12 level   3. Low vitamin D level   4. Pre-diabetes     Plan:  Reassurance that see no concerning symptoms for DVT or vascular changes at this time; will update labs today to rule out other metabolic issues that could be contributing to symptoms;  Stressed to patient to work on quit smoking- options discussed;  Congratulated patient on response to [facility]- she  will call back when she is ready to increase to 1.7 mg weekly;   No follow-ups on file.  Orders Placed This Encounter  Procedures   B12   Vitamin D (25 hydroxy)   Comp Met (CMET)   Hemoglobin A1c   CBC with Differential/Platelet    Requested Prescriptions    No prescriptions requested or ordered in this encounter

## 2022-01-15 LAB — CBC WITH DIFFERENTIAL/PLATELET
Absolute Monocytes: 774 cells/uL (ref 200–950)
Basophils Absolute: 53 cells/uL (ref 0–200)
Basophils Relative: 0.6 %
Eosinophils Absolute: 214 cells/uL (ref 15–500)
Eosinophils Relative: 2.4 %
HCT: 39.5 % (ref 35.0–45.0)
Hemoglobin: 12.7 g/dL (ref 11.7–15.5)
Lymphs Abs: 3987 cells/uL — ABNORMAL HIGH (ref 850–3900)
MCH: 26.7 pg — ABNORMAL LOW (ref 27.0–33.0)
MCHC: 32.2 g/dL (ref 32.0–36.0)
MCV: 83.2 fL (ref 80.0–100.0)
MPV: 10.9 fL (ref 7.5–12.5)
Monocytes Relative: 8.7 %
Neutro Abs: 3872 cells/uL (ref 1500–7800)
Neutrophils Relative %: 43.5 %
Platelets: 362 10*3/uL (ref 140–400)
RBC: 4.75 10*6/uL (ref 3.80–5.10)
RDW: 13.3 % (ref 11.0–15.0)
Total Lymphocyte: 44.8 %
WBC: 8.9 10*3/uL (ref 3.8–10.8)

## 2022-01-15 LAB — COMPREHENSIVE METABOLIC PANEL
AG Ratio: 1.6 (calc) (ref 1.0–2.5)
ALT: 53 U/L — ABNORMAL HIGH (ref 6–29)
AST: 32 U/L — ABNORMAL HIGH (ref 10–30)
Albumin: 4.4 g/dL (ref 3.6–5.1)
Alkaline phosphatase (APISO): 60 U/L (ref 31–125)
BUN: 8 mg/dL (ref 7–25)
CO2: 22 mmol/L (ref 20–32)
Calcium: 9.3 mg/dL (ref 8.6–10.2)
Chloride: 107 mmol/L (ref 98–110)
Creat: 0.85 mg/dL (ref 0.50–0.97)
Globulin: 2.7 g/dL (calc) (ref 1.9–3.7)
Glucose, Bld: 84 mg/dL (ref 65–99)
Potassium: 4.3 mmol/L (ref 3.5–5.3)
Sodium: 139 mmol/L (ref 135–146)
Total Bilirubin: 0.4 mg/dL (ref 0.2–1.2)
Total Protein: 7.1 g/dL (ref 6.1–8.1)

## 2022-01-15 LAB — VITAMIN D 25 HYDROXY (VIT D DEFICIENCY, FRACTURES): Vit D, 25-Hydroxy: 28 ng/mL — ABNORMAL LOW (ref 30–100)

## 2022-01-15 LAB — VITAMIN B12: Vitamin B-12: 321 pg/mL (ref 200–1100)

## 2022-01-15 LAB — HEMOGLOBIN A1C
Hgb A1c MFr Bld: 5.4 % of total Hgb (ref <5.7)
Mean Plasma Glucose: 108 mg/dL
eAG (mmol/L): 6 mmol/L

## 2022-02-16 ENCOUNTER — Other Ambulatory Visit: Payer: Self-pay | Admitting: Family

## 2022-02-16 ENCOUNTER — Encounter: Payer: Self-pay | Admitting: Family

## 2022-02-16 MED ORDER — WEGOVY 1 MG/0.5ML ~~LOC~~ SOAJ: 1.0000 mg | SUBCUTANEOUS | 0 refills | Status: DC

## 2022-02-21 MED ORDER — WEGOVY 1 MG/0.5ML ~~LOC~~ SOAJ: 1.0000 mg | SUBCUTANEOUS | 0 refills | Status: DC

## 2022-03-15 ENCOUNTER — Other Ambulatory Visit: Payer: Self-pay | Admitting: Family

## 2022-03-15 MED ORDER — WEGOVY 1.7 MG/0.75ML ~~LOC~~ SOAJ: 1.7000 mg | SUBCUTANEOUS | 0 refills | Status: DC

## 2022-04-22 ENCOUNTER — Encounter: Payer: Self-pay | Admitting: Family

## 2022-04-22 ENCOUNTER — Other Ambulatory Visit: Payer: Self-pay | Admitting: Family

## 2022-04-22 MED ORDER — WEGOVY 1.7 MG/0.75ML ~~LOC~~ SOAJ: 1.7000 mg | SUBCUTANEOUS | 1 refills | Status: DC

## 2022-04-23 ENCOUNTER — Other Ambulatory Visit: Payer: Self-pay | Admitting: Family

## 2022-04-25 ENCOUNTER — Encounter: Payer: Self-pay | Admitting: Family

## 2022-04-25 MED ORDER — CITALOPRAM HYDROBROMIDE 20 MG PO TABS: 20.0000 mg | ORAL_TABLET | Freq: Every day | ORAL | 0 refills | Status: DC

## 2022-04-25 NOTE — Telephone Encounter (Signed)
Okay to refill? Has not been seen since 01/14/22. Rx sent for 6 m last time.

## 2022-05-08 ENCOUNTER — Other Ambulatory Visit: Payer: Self-pay | Admitting: Family

## 2022-05-23 ENCOUNTER — Encounter: Payer: Self-pay | Admitting: Family

## 2022-05-23 ENCOUNTER — Other Ambulatory Visit: Payer: Self-pay | Admitting: Family

## 2022-05-23 MED ORDER — WEGOVY 1.7 MG/0.75ML ~~LOC~~ SOAJ: 1.7000 mg | SUBCUTANEOUS | 1 refills | Status: DC

## 2022-06-23 ENCOUNTER — Other Ambulatory Visit: Payer: Self-pay | Admitting: Family

## 2022-06-23 MED ORDER — WEGOVY 2.4 MG/0.75ML ~~LOC~~ SOAJ: 2.4000 mg | SUBCUTANEOUS | 0 refills | Status: DC

## 2022-07-18 ENCOUNTER — Other Ambulatory Visit: Payer: Self-pay | Admitting: Family

## 2022-07-20 ENCOUNTER — Other Ambulatory Visit: Payer: Self-pay

## 2022-07-20 MED ORDER — WEGOVY 2.4 MG/0.75ML ~~LOC~~ SOAJ: 2.4000 mg | SUBCUTANEOUS | 3 refills | Status: DC

## 2022-08-19 ENCOUNTER — Other Ambulatory Visit: Payer: Self-pay | Admitting: Family

## 2022-10-21 ENCOUNTER — Other Ambulatory Visit: Payer: Self-pay | Admitting: Family

## 2022-10-21 ENCOUNTER — Ambulatory Visit: Payer: Managed Care, Other (non HMO) | Admitting: Family

## 2022-11-04 ENCOUNTER — Ambulatory Visit: Payer: Managed Care, Other (non HMO) | Admitting: Family

## 2022-11-04 ENCOUNTER — Encounter: Payer: Self-pay | Admitting: Family

## 2022-11-04 VITALS — BP 122/84 | HR 86 | Ht 70.0 in | Wt 258.0 lb

## 2022-11-04 DIAGNOSIS — R7989 Other specified abnormal findings of blood chemistry: Secondary | ICD-10-CM | POA: Diagnosis not present

## 2022-11-04 DIAGNOSIS — E559 Vitamin D deficiency, unspecified: Secondary | ICD-10-CM | POA: Diagnosis not present

## 2022-11-04 DIAGNOSIS — R5383 Other fatigue: Secondary | ICD-10-CM | POA: Diagnosis not present

## 2022-11-04 DIAGNOSIS — R7303 Prediabetes: Secondary | ICD-10-CM

## 2022-11-04 DIAGNOSIS — D649 Anemia, unspecified: Secondary | ICD-10-CM

## 2022-11-04 LAB — CBC WITH DIFFERENTIAL/PLATELET
Absolute Monocytes: 563 cells/uL (ref 200–950)
HCT: 35.8 % (ref 35.0–45.0)
Hemoglobin: 11.1 g/dL — ABNORMAL LOW (ref 11.7–15.5)
Lymphs Abs: 3485 cells/uL (ref 850–3900)
Monocytes Relative: 6.4 %
Platelets: 347 10*3/uL (ref 140–400)

## 2022-11-04 MED ORDER — WEGOVY 2.4 MG/0.75ML ~~LOC~~ SOAJ: 2.4000 mg | SUBCUTANEOUS | 3 refills | Status: DC

## 2022-11-04 NOTE — Progress Notes (Signed)
Tara Ward is a 42 y.o. female with the following history as recorded in EpicCare:  Patient Active Problem List   Diagnosis Date Noted   Upper respiratory tract infection 08/22/2017   Encounter for general adult medical examination with abnormal findings 08/21/2013   Chronic upper back pain 07/31/2013   Pilonidal cyst with abscess 05/11/2013   Lichen simplex chronicus 11/09/2011   Benign positional vertigo 02/10/2011   Eustachian tube dysfunction 02/10/2011   OBESITY 02/22/2010   TOBACCO USE 02/22/2010   DEPRESSION 02/22/2010   PEPTIC ULCER DISEASE 02/22/2010   HIDRADENITIS SUPPURATIVA 02/22/2010    Current Outpatient Medications  Medication Sig Dispense Refill   buPROPion (WELLBUTRIN XL) 300 MG 24 hr tablet TAKE 1 TABLET BY MOUTH EVERY DAY IN THE MORNING 90 tablet 1   citalopram (CELEXA) 20 MG tablet TAKE 1 TABLET(20 MG) BY MOUTH DAILY 90 tablet 0   ferrous sulfate 325 (65 FE) MG EC tablet Take 325 mg by mouth 3 (three) times daily with meals.     oxycodone (OXY-IR) 5 MG capsule Take 5 mg by mouth every 6 (six) hours.     pantoprazole (PROTONIX) 40 MG tablet TAKE 1 TABLET(40 MG) BY MOUTH TWICE DAILY BEFORE A MEAL 180 tablet 0   vitamin C (ASCORBIC ACID) 250 MG tablet Take 250 mg by mouth daily.     Semaglutide-Weight Management (WEGOVY) 2.4 MG/0.75ML SOAJ Inject 2.4 mg into the skin once a week. 3 mL 3   No current facility-administered medications for this visit.    Allergies: Doxycycline and Lamisil [terbinafine hcl]  Past Medical History:  Diagnosis Date   Anxiety    Condyloma acuminata    Depression    Hidradenitis suppurativa    recurrent   Peptic ulcer disease    Pilonidal cyst     Past Surgical History:  Procedure Laterality Date   TUBAL LIGATION  2004    Family History  Problem Relation Age of Onset   Hyperthyroidism Mother    Diabetes Father    Diabetes Other        1st degree relative   Lung cancer Other    Stomach cancer Paternal Uncle     Esophageal cancer Neg Hx    Pancreatic cancer Neg Hx    Liver disease Neg Hx    Colon cancer Neg Hx     Social History   Tobacco Use   Smoking status: Every Day    Packs/day: 1.00    Years: 15.00    Additional pack years: 0.00    Total pack years: 15.00    Types: Cigarettes    Last attempt to quit: 07/12/2015    Years since quitting: 7.3   Smokeless tobacco: Never  Substance Use Topics   Alcohol use: Not Currently    Alcohol/week: 0.0 standard drinks of alcohol    Subjective:   Follow up on Wegovy- has lost approximately 50+ pounds; no GI side effects; Was recently found to be very anemic by provider at her job- ferritin was at 6; currently taking OTC FE 325 mg tid; concerned about fatigue/ memory changes; notes that periods are not problems- every 24-25 days; no dark stools;      Objective:  Vitals:   11/04/22 1540  BP: 122/84  Pulse: 86  SpO2: 99%  Weight: 258 lb (117 kg)  Height: 5\' 10"  (1.778 m)    General: Well developed, well nourished, in no acute distress  Skin : Warm and dry.  Head: Normocephalic and atraumatic  Lungs: Respirations unlabored; clear to auscultation bilaterally without wheeze, rales, rhonchi  CVS exam: normal rate and regular rhythm.  Abdomen: Soft; nontender; nondistended; normoactive bowel sounds; no masses or hepatosplenomegaly  Musculoskeletal: No deformities; no active joint inflammation  Extremities: No edema, cyanosis, clubbing  Vessels: Symmetric bilaterally  Neurologic: Alert and oriented; speech intact; face symmetrical; moves all extremities well; CNII-XII intact without focal deficit  Assessment:  1. Other fatigue   2. Vitamin D deficiency   3. Pre-diabetes   4. Elevated LFTs   5. Anemia, unspecified type     Plan:  Will updated labs today- concerned for source of anemia; will go ahead with referral to GI for further evaluation; to consider GYN evaluation if needed; follow up to be determined;  Plan to continue [facility] for  now- patient will meet with nutritionist through her employer; will discuss tapering off the [name] in 6 months or so.   Time spent 30 minutes  No follow-ups on file.  Orders Placed This Encounter  Procedures   Iron, TIBC and Ferritin Panel   B12   TSH   Vitamin D (25 hydroxy)   CBC with Differential/Platelet   Comp Met (CMET)   Hemoglobin A1c   Ambulatory referral to Gastroenterology    Referral Priority:   Routine    Referral Type:   Consultation    Referral Reason:   Specialty Services Required    Referred to Provider:   Hilarie Fredrickson, MD    Number of Visits Requested:   1    Requested Prescriptions   Signed Prescriptions Disp Refills   Semaglutide-Weight Management (WEGOVY) 2.4 MG/0.75ML SOAJ 3 mL 3    Sig: Inject 2.4 mg into the skin once a week.

## 2022-11-05 LAB — CBC WITH DIFFERENTIAL/PLATELET
Basophils Absolute: 44 cells/uL (ref 0–200)
Basophils Relative: 0.5 %
Eosinophils Absolute: 202 cells/uL (ref 15–500)
Eosinophils Relative: 2.3 %
MCH: 24.6 pg — ABNORMAL LOW (ref 27.0–33.0)
MCHC: 31 g/dL — ABNORMAL LOW (ref 32.0–36.0)
MCV: 79.2 fL — ABNORMAL LOW (ref 80.0–100.0)
MPV: 10.4 fL (ref 7.5–12.5)
Neutro Abs: 4506 cells/uL (ref 1500–7800)
Neutrophils Relative %: 51.2 %
RBC: 4.52 10*6/uL (ref 3.80–5.10)
RDW: 16.6 % — ABNORMAL HIGH (ref 11.0–15.0)
Total Lymphocyte: 39.6 %
WBC: 8.8 10*3/uL (ref 3.8–10.8)

## 2022-11-05 LAB — COMPREHENSIVE METABOLIC PANEL
AG Ratio: 1.8 (calc) (ref 1.0–2.5)
ALT: 14 U/L (ref 6–29)
AST: 13 U/L (ref 10–30)
Albumin: 4.5 g/dL (ref 3.6–5.1)
Alkaline phosphatase (APISO): 52 U/L (ref 31–125)
BUN: 9 mg/dL (ref 7–25)
CO2: 25 mmol/L (ref 20–32)
Calcium: 9.4 mg/dL (ref 8.6–10.2)
Chloride: 108 mmol/L (ref 98–110)
Creat: 0.69 mg/dL (ref 0.50–0.97)
Globulin: 2.5 g/dL (calc) (ref 1.9–3.7)
Glucose, Bld: 83 mg/dL (ref 65–99)
Potassium: 4.5 mmol/L (ref 3.5–5.3)
Sodium: 139 mmol/L (ref 135–146)
Total Bilirubin: 0.3 mg/dL (ref 0.2–1.2)
Total Protein: 7 g/dL (ref 6.1–8.1)

## 2022-11-05 LAB — HEMOGLOBIN A1C
Hgb A1c MFr Bld: 5.5 % of total Hgb (ref <5.7)
Mean Plasma Glucose: 111 mg/dL
eAG (mmol/L): 6.2 mmol/L

## 2022-11-05 LAB — IRON,TIBC AND FERRITIN PANEL
%SAT: 7 % (calc) — ABNORMAL LOW (ref 16–45)
Ferritin: 12 ng/mL — ABNORMAL LOW (ref 16–154)
Iron: 30 ug/dL — ABNORMAL LOW (ref 40–190)
TIBC: 455 mcg/dL (calc) — ABNORMAL HIGH (ref 250–450)

## 2022-11-05 LAB — VITAMIN D 25 HYDROXY (VIT D DEFICIENCY, FRACTURES): Vit D, 25-Hydroxy: 19 ng/mL — ABNORMAL LOW (ref 30–100)

## 2022-11-05 LAB — TSH: TSH: 1.04 mIU/L

## 2022-11-05 LAB — VITAMIN B12: Vitamin B-12: 302 pg/mL (ref 200–1100)

## 2022-11-07 ENCOUNTER — Other Ambulatory Visit: Payer: Self-pay | Admitting: Family

## 2022-11-07 MED ORDER — VITAMIN D (ERGOCALCIFEROL) 1.25 MG (50000 UNIT) PO CAPS: 50000.0000 [IU] | ORAL_CAPSULE | ORAL | 0 refills | Status: AC

## 2022-11-23 ENCOUNTER — Other Ambulatory Visit: Payer: Self-pay | Admitting: Family

## 2022-12-14 ENCOUNTER — Other Ambulatory Visit: Payer: Self-pay | Admitting: Family

## 2022-12-14 NOTE — Progress Notes (Deleted)
12/14/2022 Tara Ward [id number] 05-13-1981  Referring provider: Olive Bass,* Primary GI doctor: Dr. Marina Goodell  ASSESSMENT AND PLAN:   There are no diagnoses linked to this encounter.   Patient Care Team: Olive Bass, FNP as PCP - General (Internal Medicine)  HISTORY OF PRESENT ILLNESS: 42 y.o. female with a past medical history of anxiety, depression, hidradenitis suppurativa, GERD and others listed below presents for evaluation of ***.   02/2019 abdominal ultrasound unremarkable for abdominal pain 10/12/2020 office visit with Dr. Marina Goodell for GERD, at the time patient was 1 month pregnant, had some improvement with pantoprazole 40 mg daily, encouraged to continue this, no alarm symptoms and no indication for endoscopy.  Patient {Actions; denies-reports:120008} family history of colon cancer or other gastrointestinal malignancies. She {Actions; denies-reports:120008} blood thinner use.  She {Actions; denies-reports:120008} NSAID use.  She {Actions; denies-reports:120008} ETOH use.   She {Actions; denies-reports:120008} tobacco use.  She {Actions; denies-reports:120008} drug use.    She  reports that she has been smoking cigarettes. She has a 15.00 pack-year smoking history. She has never used smokeless tobacco. She reports that she does not currently use alcohol. She reports that she does not use drugs.  RELEVANT LABS AND IMAGING: CBC    Component Value Date/Time   WBC 8.8 11/04/2022 1621   RBC 4.52 11/04/2022 1621   HGB 11.1 (L) 11/04/2022 1621   HCT 35.8 11/04/2022 1621   PLT 347 11/04/2022 1621   MCV 79.2 (L) 11/04/2022 1621   MCH 24.6 (L) 11/04/2022 1621   MCHC 31.0 (L) 11/04/2022 1621   RDW 16.6 (H) 11/04/2022 1621   LYMPHSABS 3,485 11/04/2022 1621   MONOABS 0.5 05/19/2021 0737   EOSABS 202 11/04/2022 1621   BASOSABS 44 11/04/2022 1621   Recent Labs    01/14/22 1447 11/04/22 1621  HGB 12.7 11.1*    CMP     Component Value  Date/Time   NA 139 11/04/2022 1621   K 4.5 11/04/2022 1621   CL 108 11/04/2022 1621   CO2 25 11/04/2022 1621   GLUCOSE 83 11/04/2022 1621   BUN 9 11/04/2022 1621   CREATININE 0.69 11/04/2022 1621   CALCIUM 9.4 11/04/2022 1621   PROT 7.0 11/04/2022 1621   ALBUMIN 4.5 05/19/2021 0737   AST 13 11/04/2022 1621   ALT 14 11/04/2022 1621   ALKPHOS 57 05/19/2021 0737   BILITOT 0.3 11/04/2022 1621   GFRNONAA >60 07/05/2018 1853   GFRAA >60 07/05/2018 1853      Latest Ref Rng & Units 11/04/2022    4:21 PM 01/14/2022    2:47 PM 10/22/2021    3:31 PM  Hepatic Function  Total Protein 6.1 - 8.1 g/dL 7.0  7.1  6.5   AST 10 - 30 U/L 13  32  29   ALT 6 - 29 U/L 14  53  44   Total Bilirubin 0.2 - 1.2 mg/dL 0.3  0.4  0.2       Current Medications:      Current Outpatient Medications (Analgesics):    oxycodone (OXY-IR) 5 MG capsule, Take 5 mg by mouth every 6 (six) hours.  Current Outpatient Medications (Hematological):    ferrous sulfate 325 (65 FE) MG EC tablet, Take 325 mg by mouth 3 (three) times daily with meals.  Current Outpatient Medications (Other):    buPROPion (WELLBUTRIN XL) 300 MG 24 hr tablet, Take 1 tablet (300 mg total) by mouth daily.   citalopram (CELEXA) 20 MG tablet, TAKE  1 TABLET(20 MG) BY MOUTH DAILY   pantoprazole (PROTONIX) 40 MG tablet, TAKE 1 TABLET(40 MG) BY MOUTH TWICE DAILY BEFORE A MEAL   Semaglutide-Weight Management (WEGOVY) 2.4 MG/0.75ML SOAJ, Inject 2.4 mg into the skin once a week.   vitamin C (ASCORBIC ACID) 250 MG tablet, Take 250 mg by mouth daily.   Vitamin D, Ergocalciferol, (DRISDOL) 1.25 MG (50000 UNIT) CAPS capsule, Take 1 capsule (50,000 Units total) by mouth every 7 (seven) days for 12 doses.  Medical History:  Past Medical History:  Diagnosis Date   Anxiety    Condyloma acuminata    Depression    Hidradenitis suppurativa    recurrent   Peptic ulcer disease    Pilonidal cyst    Allergies:  Allergies  Allergen Reactions    Doxycycline Swelling    SWELLING IN OPTIC NERVE.   Lamisil [Terbinafine Hcl]     Nausea, GERD     Surgical History:  She  has a past surgical history that includes Tubal ligation (2004). Family History:  Her family history includes Diabetes in her father and another family member; Hyperthyroidism in her mother; Lung cancer in an other family member; Stomach cancer in her paternal uncle.  REVIEW OF SYSTEMS  : All other systems reviewed and negative except where noted in the History of Present Illness.  PHYSICAL EXAM: There were no vitals taken for this visit. General Appearance: Well nourished, in no apparent distress. Head:   Normocephalic and atraumatic. Eyes:  sclerae anicteric,conjunctive pink  Respiratory: Respiratory effort normal, BS equal bilaterally without rales, rhonchi, wheezing. Cardio: RRR with no MRGs. Peripheral pulses intact.  Abdomen: Soft,  {BlankSingle:19197::"Flat","Obese","Non-distended"} ,active bowel sounds. {actendernessAB:27319} tenderness {anatomy; site abdomen:5010}. {BlankMultiple:19196::"Without guarding","With guarding","Without rebound","With rebound"}. No masses. Rectal: {acrectalexam:27461} Musculoskeletal: Full ROM, {PSY - GAIT AND STATION:22860} gait. {With/Without:304960234} edema. Skin:  Dry and intact without significant lesions or rashes Neuro: Alert and  oriented x4;  No focal deficits. Psych:  Cooperative. Normal mood and affect.    Doree Albee, PA-C 1:18 PM

## 2022-12-15 ENCOUNTER — Ambulatory Visit: Payer: Managed Care, Other (non HMO) | Admitting: Physician Assistant

## 2023-01-11 ENCOUNTER — Ambulatory Visit: Payer: Managed Care, Other (non HMO) | Admitting: Physician Assistant

## 2023-01-21 ENCOUNTER — Other Ambulatory Visit: Payer: Self-pay | Admitting: Family

## 2023-02-03 ENCOUNTER — Ambulatory Visit: Payer: Managed Care, Other (non HMO) | Admitting: Nurse Practitioner

## 2023-03-13 ENCOUNTER — Other Ambulatory Visit: Payer: Self-pay | Admitting: Family

## 2023-03-13 NOTE — Telephone Encounter (Signed)
Called pt and left a VM asking pt to call the office back to schedule a follow up appointment.

## 2023-03-13 NOTE — Telephone Encounter (Signed)
She needs to be seen to follow up on her low iron/ abnormal labs that we discussed in May.

## 2023-03-13 NOTE — Telephone Encounter (Signed)
Pt has appointment scheduled 03/17/2023.

## 2023-03-17 ENCOUNTER — Ambulatory Visit: Payer: Managed Care, Other (non HMO) | Admitting: Family

## 2023-03-22 ENCOUNTER — Other Ambulatory Visit: Payer: Self-pay | Admitting: Family

## 2023-04-04 ENCOUNTER — Other Ambulatory Visit: Payer: Self-pay | Admitting: Family

## 2023-04-14 ENCOUNTER — Ambulatory Visit: Payer: Managed Care, Other (non HMO) | Admitting: Family

## 2023-04-21 ENCOUNTER — Ambulatory Visit: Payer: Managed Care, Other (non HMO) | Admitting: Nurse Practitioner

## 2023-04-30 ENCOUNTER — Other Ambulatory Visit: Payer: Self-pay | Admitting: Family

## 2023-05-01 ENCOUNTER — Other Ambulatory Visit: Payer: Self-pay | Admitting: Family

## 2023-06-02 ENCOUNTER — Ambulatory Visit: Payer: Managed Care, Other (non HMO) | Admitting: Family

## 2023-06-02 ENCOUNTER — Encounter: Payer: Self-pay | Admitting: Family

## 2023-06-02 VITALS — BP 128/88 | HR 83 | Resp 16 | Ht 70.0 in | Wt 266.4 lb

## 2023-06-02 DIAGNOSIS — E559 Vitamin D deficiency, unspecified: Secondary | ICD-10-CM | POA: Diagnosis not present

## 2023-06-02 DIAGNOSIS — G5603 Carpal tunnel syndrome, bilateral upper limbs: Secondary | ICD-10-CM | POA: Diagnosis not present

## 2023-06-02 DIAGNOSIS — D509 Iron deficiency anemia, unspecified: Secondary | ICD-10-CM | POA: Diagnosis not present

## 2023-06-02 DIAGNOSIS — R7303 Prediabetes: Secondary | ICD-10-CM

## 2023-06-02 DIAGNOSIS — R194 Change in bowel habit: Secondary | ICD-10-CM

## 2023-06-02 NOTE — Progress Notes (Signed)
Tara Ward is a 42 y.o. female with the following history as recorded in EpicCare:  Patient Active Problem List   Diagnosis Date Noted   Upper respiratory tract infection 08/22/2017   Encounter for general adult medical examination with abnormal findings 08/21/2013   Chronic upper back pain 07/31/2013   Pilonidal cyst with abscess 05/11/2013   Lichen simplex chronicus 11/09/2011   Benign positional vertigo 02/10/2011   Eustachian tube dysfunction 02/10/2011   OBESITY 02/22/2010   TOBACCO USE 02/22/2010   DEPRESSION 02/22/2010   Peptic ulcer 02/22/2010   HIDRADENITIS SUPPURATIVA 02/22/2010    Current Outpatient Medications  Medication Sig Dispense Refill   buPROPion (WELLBUTRIN XL) 300 MG 24 hr tablet Take 1 tablet (300 mg total) by mouth daily. 90 tablet 1   citalopram (CELEXA) 20 MG tablet TAKE 1 TABLET(20 MG) BY MOUTH DAILY 30 tablet 0   ferrous sulfate 325 (65 FE) MG EC tablet Take 325 mg by mouth 3 (three) times daily with meals. 3 times a week     oxycodone (OXY-IR) 5 MG capsule Take 5 mg by mouth every 6 (six) hours.     pantoprazole (PROTONIX) 40 MG tablet TAKE 1 TABLET(40 MG) BY MOUTH TWICE DAILY BEFORE A MEAL 60 tablet 0   vitamin C (ASCORBIC ACID) 250 MG tablet Take 250 mg by mouth daily. 3 times a week     WEGOVY 2.4 MG/0.75ML SOAJ Inject 2.4 mg into the skin once a week. (Patient not taking: Reported on 06/02/2023) 3 mL 0   No current facility-administered medications for this visit.    Allergies: Doxycycline and Lamisil [terbinafine hcl]  Past Medical History:  Diagnosis Date   Anxiety    Condyloma acuminata    Depression    Hidradenitis suppurativa    recurrent   Peptic ulcer disease    Pilonidal cyst     Past Surgical History:  Procedure Laterality Date   TUBAL LIGATION  2004    Family History  Problem Relation Age of Onset   Hyperthyroidism Mother    Diabetes Father    Diabetes Other        1st degree relative   Lung cancer Other    Stomach  cancer Paternal Uncle    Esophageal cancer Neg Hx    Pancreatic cancer Neg Hx    Liver disease Neg Hx    Colon cancer Neg Hx     Social History   Tobacco Use   Smoking status: Every Day    Current packs/day: 0.00    Average packs/day: 1 pack/day for 15.0 years (15.0 ttl pk-yrs)    Types: Cigarettes    Start date: 07/11/2000    Last attempt to quit: 07/12/2015    Years since quitting: 7.8   Smokeless tobacco: Never  Substance Use Topics   Alcohol use: Not Currently    Alcohol/week: 0.0 standard drinks of alcohol    Subjective:   Concerned for sensation of hand pain/ tingling at night; has been using OTC hand splint- limited relief; notes that role at work has changed- having to spend increased time typing;   Also needs to get labs updated to follow up on her anemia/ has not been able to follow up with GI;   Objective:  Vitals:   06/02/23 1344  BP: 128/88  Pulse: 83  Resp: 16  SpO2: 96%  Weight: 266 lb 6.4 oz (120.8 kg)  Height: 5\' 10"  (1.778 m)    General: Well developed, well nourished, in no acute  distress  Skin : Warm and dry.  Head: Normocephalic and atraumatic  Eyes: Sclera and conjunctiva clear; pupils round and reactive to light; extraocular movements intact  Ears: External normal; canals clear; tympanic membranes normal  Oropharynx: Pink, supple. No suspicious lesions  Neck: Supple without thyromegaly, adenopathy  Lungs: Respirations unlabored; clear to auscultation bilaterally without wheeze, rales, rhonchi  CVS exam: normal rate and regular rhythm.  Musculoskeletal: No deformities; no active joint inflammation  Extremities: No edema, cyanosis, clubbing  Vessels: Symmetric bilaterally  Neurologic: Alert and oriented; speech intact; face symmetrical; moves all extremities well; CNII-XII intact without focal deficit   Assessment:  1. Iron deficiency anemia, unspecified iron deficiency anemia type   2. Vitamin D deficiency   3. Pre-diabetes   4. Bilateral  carpal tunnel syndrome   5. Bowel habit changes     Plan:  Update labs today- patient has been taking oral iron/ Vitamin C; to consider referral for iron infusions; Check Vitamin D level today; Check Hgba1c today;  Referral to orthopedics for further evaluation; Update abdominal ultrasound- will need to consider referral back to GI;  Based on labs, will update Rx to re-start Wegovy; follow up to be determined;   No follow-ups on file.  Orders Placed This Encounter  Procedures   US Abdomen Complete    Standing Status:   Future    Standing Expiration Date:   06/01/2024    Order Specific Question:   Reason for Exam (SYMPTOM  OR DIAGNOSIS REQUIRED)    Answer:   abdominal pain/ diarrhea    Order Specific Question:   Preferred imaging location?    Answer:   [facility]   CBC with Differential/Platelet   B12   Hemoglobin A1c   Vitamin D (25 hydroxy)   Iron, TIBC and Ferritin Panel   Ambulatory referral to Orthopedic Surgery    Referral Priority:   Routine    Referral Type:   Surgical    Referral Reason:   Specialty Services Required    Requested Specialty:   Orthopedic Surgery    Number of Visits Requested:   1    Requested Prescriptions    No prescriptions requested or ordered in this encounter

## 2023-06-03 ENCOUNTER — Other Ambulatory Visit: Payer: Self-pay | Admitting: Family

## 2023-06-03 LAB — CBC WITH DIFFERENTIAL/PLATELET
Absolute Lymphocytes: 3466 {cells}/uL (ref 850–3900)
Absolute Monocytes: 528 {cells}/uL (ref 200–950)
Basophils Absolute: 38 {cells}/uL (ref 0–200)
Basophils Relative: 0.4 %
Eosinophils Absolute: 307 {cells}/uL (ref 15–500)
Eosinophils Relative: 3.2 %
HCT: 41.6 % (ref 35.0–45.0)
Hemoglobin: 13 g/dL (ref 11.7–15.5)
MCH: 27.4 pg (ref 27.0–33.0)
MCHC: 31.3 g/dL — ABNORMAL LOW (ref 32.0–36.0)
MCV: 87.8 fL (ref 80.0–100.0)
MPV: 10.6 fL (ref 7.5–12.5)
Monocytes Relative: 5.5 %
Neutro Abs: 5261 {cells}/uL (ref 1500–7800)
Neutrophils Relative %: 54.8 %
Platelets: 354 10*3/uL (ref 140–400)
RBC: 4.74 10*6/uL (ref 3.80–5.10)
RDW: 12.8 % (ref 11.0–15.0)
Total Lymphocyte: 36.1 %
WBC: 9.6 10*3/uL (ref 3.8–10.8)

## 2023-06-03 LAB — IRON,TIBC AND FERRITIN PANEL
%SAT: 14 % — ABNORMAL LOW (ref 16–45)
Ferritin: 27 ng/mL (ref 16–154)
Iron: 52 ug/dL (ref 40–190)
TIBC: 383 ug/dL (ref 250–450)

## 2023-06-03 LAB — HEMOGLOBIN A1C
Hgb A1c MFr Bld: 5.5 %{Hb} (ref <5.7)
Mean Plasma Glucose: 111 mg/dL
eAG (mmol/L): 6.2 mmol/L

## 2023-06-03 LAB — VITAMIN D 25 HYDROXY (VIT D DEFICIENCY, FRACTURES): Vit D, 25-Hydroxy: 17 ng/mL — ABNORMAL LOW (ref 30–100)

## 2023-06-03 LAB — VITAMIN B12: Vitamin B-12: 216 pg/mL (ref 200–1100)

## 2023-06-05 ENCOUNTER — Other Ambulatory Visit: Payer: Self-pay | Admitting: Family

## 2023-06-05 MED ORDER — WEGOVY 0.25 MG/0.5ML ~~LOC~~ SOAJ: 0.2500 mg | SUBCUTANEOUS | 0 refills | Status: DC

## 2023-06-05 MED ORDER — VITAMIN D (ERGOCALCIFEROL) 1.25 MG (50000 UNIT) PO CAPS: 50000.0000 [IU] | ORAL_CAPSULE | ORAL | 0 refills | Status: AC

## 2023-06-06 ENCOUNTER — Encounter: Payer: Self-pay | Admitting: Family

## 2023-06-15 ENCOUNTER — Other Ambulatory Visit: Payer: Self-pay | Admitting: Family

## 2023-06-15 ENCOUNTER — Ambulatory Visit
Admission: RE | Admit: 2023-06-15 | Discharge: 2023-06-15 | Disposition: A | Discharge status: Home / Self Care | Payer: Managed Care, Other (non HMO) | Source: Ambulatory Visit | Attending: Family | Admitting: Family

## 2023-06-15 DIAGNOSIS — R194 Change in bowel habit: Secondary | ICD-10-CM

## 2023-06-16 ENCOUNTER — Ambulatory Visit: Payer: Managed Care, Other (non HMO) | Admitting: Surgical

## 2023-06-19 ENCOUNTER — Other Ambulatory Visit: Payer: Self-pay | Admitting: Family

## 2023-06-30 ENCOUNTER — Ambulatory Visit: Payer: Managed Care, Other (non HMO) | Admitting: Surgical

## 2023-07-01 ENCOUNTER — Other Ambulatory Visit: Payer: Self-pay | Admitting: Family

## 2023-07-03 ENCOUNTER — Telehealth: Payer: Self-pay

## 2023-07-03 ENCOUNTER — Other Ambulatory Visit: Payer: Self-pay | Admitting: Family

## 2023-07-03 MED ORDER — WEGOVY 0.5 MG/0.5ML ~~LOC~~ SOAJ: 0.5000 mg | SUBCUTANEOUS | 0 refills | Status: DC

## 2023-07-03 NOTE — Telephone Encounter (Signed)
 PA initiated via [email or web address]; ID: [id number]. Awaiting determination.

## 2023-07-04 NOTE — Telephone Encounter (Signed)
 PA denied. Wt loss medications are a plan exclusion.

## 2023-07-04 NOTE — Telephone Encounter (Signed)
 PA denied. Awaiting denial information.   Drug/Service Name: WEGOVY  0.5 MG/0.5 ML PEN Physician/Nurse: Leita Elbe EOC ID: [id number] Status: Denied Date Requested: 07/03/2023 13:46:41 Date Closed: 07/04/2023 10:26:48 Dispensing Location: Retail Pharmacy   Est.Time of Completion: N/A

## 2023-07-05 NOTE — Telephone Encounter (Signed)
Sent pt a Mychart message to inform her. 

## 2023-07-21 ENCOUNTER — Ambulatory Visit: Payer: Managed Care, Other (non HMO) | Admitting: Surgical

## 2023-07-23 ENCOUNTER — Other Ambulatory Visit: Payer: Self-pay | Admitting: Family

## 2023-08-29 ENCOUNTER — Telehealth: Payer: Self-pay

## 2023-08-29 NOTE — Telephone Encounter (Signed)
 Pt is needing a PA for Pantoprazole 40 mg tablets please.  Thank you.

## 2023-08-31 ENCOUNTER — Telehealth: Payer: Self-pay

## 2023-08-31 ENCOUNTER — Other Ambulatory Visit (HOSPITAL_COMMUNITY): Payer: Self-pay

## 2023-08-31 NOTE — Telephone Encounter (Signed)
 Pharmacy Patient Advocate Encounter   Received notification from Pt Calls Messages that prior authorization for PANTOPRAZOLE SOD DR 40 MG TAB is required/requested.   Insurance verification completed.   The patient is insured through [facility] .   Per test claim: PA required; PA submitted to above mentioned insurance via Prompt PA Key/confirmation #/EOC [id number] Status is pending     [email or web address]

## 2023-08-31 NOTE — Telephone Encounter (Signed)
 PA request has been Submitted. New Encounter has been or will be created for follow up. For additional info see Pharmacy Prior Auth telephone encounter from 08/31/23.

## 2023-09-01 ENCOUNTER — Other Ambulatory Visit (HOSPITAL_COMMUNITY): Payer: Self-pay

## 2023-09-01 NOTE — Telephone Encounter (Signed)
 Pharmacy Patient Advocate Encounter  Received notification from [employer] that Prior Authorization for PANTOPRAZOLE SOD DR 40 MG TAB has been APPROVED   PA #/Case ID/Reference #:[id number]

## 2023-09-21 ENCOUNTER — Other Ambulatory Visit (HOSPITAL_COMMUNITY): Payer: Self-pay

## 2023-09-21 ENCOUNTER — Telehealth: Payer: Self-pay

## 2023-09-21 NOTE — Telephone Encounter (Signed)
 Good afternoon! Tara Ward was denied back in January, this PA may not be needed.

## 2023-09-21 NOTE — Telephone Encounter (Signed)
 Pharmacy Patient Advocate Encounter   Received PA form for [facility]. Per PA, chart notes are required (dated within the past 90 days) showing diagnosis, current weight, and current BMI.

## 2023-09-21 NOTE — Telephone Encounter (Signed)
 Saving PA form to patient's chart for future use if needed.

## 2023-10-06 ENCOUNTER — Other Ambulatory Visit: Payer: Self-pay | Admitting: Family

## 2023-12-08 ENCOUNTER — Encounter: Admitting: Family

## 2023-12-31 ENCOUNTER — Other Ambulatory Visit: Payer: Self-pay | Admitting: Family

## 2024-01-02 ENCOUNTER — Other Ambulatory Visit: Payer: Self-pay | Admitting: Family

## 2024-01-04 ENCOUNTER — Encounter: Payer: Self-pay | Admitting: Family

## 2024-02-03 ENCOUNTER — Other Ambulatory Visit: Payer: Self-pay | Admitting: Family

## 2024-02-05 ENCOUNTER — Encounter: Payer: Self-pay | Admitting: Family

## 2024-02-05 ENCOUNTER — Other Ambulatory Visit: Payer: Self-pay | Admitting: Family

## 2024-02-05 MED ORDER — BUPROPION HCL ER (XL) 300 MG PO TB24: 300.0000 mg | ORAL_TABLET | Freq: Every day | ORAL | 0 refills | Status: DC

## 2024-02-05 MED ORDER — CITALOPRAM HYDROBROMIDE 20 MG PO TABS: 20.0000 mg | ORAL_TABLET | Freq: Every day | ORAL | 0 refills | Status: DC

## 2024-02-18 ENCOUNTER — Other Ambulatory Visit: Payer: Self-pay | Admitting: Family

## 2024-03-07 ENCOUNTER — Other Ambulatory Visit: Payer: Self-pay | Admitting: Family

## 2024-03-15 ENCOUNTER — Other Ambulatory Visit: Payer: Self-pay | Admitting: Family

## 2024-03-25 ENCOUNTER — Other Ambulatory Visit: Payer: Self-pay | Admitting: Family Medicine

## 2024-03-25 MED ORDER — BUPROPION HCL ER (XL) 300 MG PO TB24: 300.0000 mg | ORAL_TABLET | Freq: Every day | ORAL | 2 refills | Status: DC

## 2024-03-25 NOTE — Telephone Encounter (Unsigned)
 Copied from CRM #[id number]. Topic: Clinical - Medication Refill >> Mar 25, 2024  2:03 PM Berneda FALCON wrote: Medication:  buPROPion  (WELLBUTRIN  XL) 300 MG 24 hr tablet  Please note, this was prescribed by Leita Elbe who is no longer at the practice, patient has TOC appt in December, but is completely out of medication.  Has the patient contacted their pharmacy? Yes (Agent: If no, request that the patient contact the pharmacy for the refill. If patient does not wish to contact the pharmacy document the reason why and proceed with request.) (Agent: If yes, when and what did the pharmacy advise?)  This is the patient's preferred pharmacy:  [id number] - [place], [place] - [address] AT [facility] [address] [place] [address] Phone: [phone] Fax: [phone]  Is this the correct pharmacy for this prescription? Yes If no, delete pharmacy and type the correct one.   Has the prescription been filled recently? No  Is the patient out of the medication? Yes  Has the patient been seen for an appointment in the last year OR does the patient have an upcoming appointment? Yes  Can we respond through MyChart? Yes  Agent: Please be advised that Rx refills may take up to 3 business days. We ask that you follow-up with your pharmacy.

## 2024-04-18 ENCOUNTER — Other Ambulatory Visit: Payer: Self-pay | Admitting: Family

## 2024-04-19 ENCOUNTER — Other Ambulatory Visit: Payer: Self-pay | Admitting: Family Medicine

## 2024-04-19 ENCOUNTER — Other Ambulatory Visit: Payer: Self-pay | Admitting: Neurology

## 2024-04-19 MED ORDER — PANTOPRAZOLE SODIUM 40 MG PO TBEC: 40.0000 mg | DELAYED_RELEASE_TABLET | Freq: Two times a day (BID) | ORAL | 0 refills | Status: DC

## 2024-04-26 ENCOUNTER — Encounter: Payer: Self-pay | Admitting: Family Medicine

## 2024-04-27 ENCOUNTER — Other Ambulatory Visit: Payer: Self-pay | Admitting: Family

## 2024-04-27 MED ORDER — CITALOPRAM HYDROBROMIDE 20 MG PO TABS: 20.0000 mg | ORAL_TABLET | Freq: Every day | ORAL | 1 refills | Status: AC

## 2024-04-29 ENCOUNTER — Encounter: Payer: Self-pay | Admitting: Radiology

## 2024-06-11 ENCOUNTER — Encounter: Admitting: Family Medicine

## 2024-06-11 DIAGNOSIS — R7303 Prediabetes: Secondary | ICD-10-CM

## 2024-06-11 DIAGNOSIS — Z0000 Encounter for general adult medical examination without abnormal findings: Secondary | ICD-10-CM

## 2024-06-11 DIAGNOSIS — D509 Iron deficiency anemia, unspecified: Secondary | ICD-10-CM

## 2024-06-11 DIAGNOSIS — E559 Vitamin D deficiency, unspecified: Secondary | ICD-10-CM

## 2024-06-11 NOTE — Progress Notes (Incomplete)
 New Patient Office Visit   Subjective     Patient ID: Tara Ward, female   DOB: 10-16-1980  Age: 43 y.o. MRN: [id number]   CC:  No chief complaint on file.     HPI Tara Ward presents to establish care.    Anemia: Anemia status: {Blank single:19197::controlled,uncontrolled,better,worse,exacerbated,stable} Etiology of anemia: Duration of anemia treatment: Ferrous Sulfate 65 mg three times daily.  Compliance with treatment: {Blank single:19197::excellent compliance,good compliance,fair compliance,poor compliance} Iron supplementation side effects: {Blank single:19197::yes,no} Severity of anemia: {Blank single:19197::mild,moderate,severe} Fatigue: {Blank single:19197::yes,no} Decreased exercise tolerance: {Blank single:19197::yes,no}  Dyspnea on exertion: {Blank single:19197::yes,no} Palpitations: {Blank single:19197::yes,no} Bleeding: {Blank single:19197::yes,no} Pica: {Blank single:19197::yes,no}   Pre-Diabetes: - Checking glucose at home: *** - Medications: none - Compliance: n/a - Diet: *** - Exercise: *** - Denies symptoms of hypoglycemia, polyuria, polydipsia, numbness extremities, foot ulcers/trauma, wounds that are not healing, medication side effects  Lab Results  Component Value Date   HGBA1C 5.5 06/02/2023     Vitamin-D Deficiency: - Last Vitamin-D: Lab Results  Component Value Date   VD25OH 17 (L) 06/02/2023       Show/hide medication list[1] Past Medical History:  Diagnosis Date   Anxiety    Condyloma acuminata    Depression    Hidradenitis suppurativa    recurrent   Peptic ulcer disease    Pilonidal cyst     Past Surgical History:  Procedure Laterality Date   TUBAL LIGATION  2004     Family History  Problem Relation Age of Onset   Hyperthyroidism Mother    Diabetes Father    Diabetes Other        1st degree relative   Lung cancer Other    Stomach cancer Paternal  Uncle    Esophageal cancer Neg Hx    Pancreatic cancer Neg Hx    Liver disease Neg Hx    Colon cancer Neg Hx     Social History   Socioeconomic History   Marital status: Married    Spouse name: Not on file   Number of children: 0   Years of education: 15   Highest education level: Not on file  Occupational History   Occupation: LAWYER, [employer]: [employer]  Tobacco Use   Smoking status: Every Day    Current packs/day: 0.00    Average packs/day: 1 pack/day for 15.0 years (15.0 ttl pk-yrs)    Types: Cigarettes    Start date: 07/11/2000    Last attempt to quit: 07/12/2015    Years since quitting: 8.9   Smokeless tobacco: Never  Vaping Use   Vaping status: Never Used  Substance and Sexual Activity   Alcohol use: Not Currently    Alcohol/week: 0.0 standard drinks of alcohol   Drug use: No   Sexual activity: Not Currently  Other Topics Concern   Not on file  Social History Narrative   No regular excerise   Fun: Spend time with family.   Denies religious beliefs that would effect healthcare.    Social Drivers of Health   Tobacco Use: High Risk (06/02/2023)   Patient History    Smoking Tobacco Use: Every Day    Smokeless Tobacco Use: Never    Passive Exposure: Not on file  Financial Resource Strain: Not on file  Food Insecurity: Not on file  Transportation Needs: Not on file  Physical Activity: Not on file  Stress: Not on file  Social Connections: Not on file  Depression (PHQ2-9):  Low Risk (11/04/2022)   Depression (PHQ2-9)    PHQ-2 Score: 0  Alcohol Screen: Not on file  Housing: Not on file  Utilities: Not on file  Health Literacy: Not on file       ROS All review of systems negative except what is listed in the HPI    Objective     There were no vitals taken for this visit.  Physical Exam     Assessment & Plan:     Problem List Items Addressed This Visit   None Visit Diagnoses       Encounter for medical examination to establish  care    -  Primary     Iron deficiency anemia, unspecified iron deficiency anemia type         Pre-diabetes         Vitamin D  deficiency             No follow-ups on file.  Waddell KATHEE Mon, NP  I,Emily Lagle,acting as a scribe for Waddell KATHEE Mon, NP.,have documented all relevant documentation on the behalf of Waddell KATHEE Mon, NP.  I, Waddell KATHEE Mon, NP, have reviewed all documentation for this visit. The documentation on 06/11/2024 for the exam, diagnosis, procedures, and orders are all accurate and complete.    [1]  Outpatient Medications Prior to Visit  Medication Sig   ALPRAZolam (XANAX) 0.5 MG tablet Take 0.5 mg by mouth 3 (three) times daily as needed.   buPROPion  (WELLBUTRIN  XL) 300 MG 24 hr tablet Take 1 tablet (300 mg total) by mouth daily.   citalopram  (CELEXA ) 20 MG tablet Take 1 tablet (20 mg total) by mouth daily.   ferrous sulfate 325 (65 FE) MG EC tablet Take 325 mg by mouth 3 (three) times daily with meals. 3 times a week   oxycodone  (OXY-IR) 5 MG capsule Take 5 mg by mouth every 6 (six) hours.   pantoprazole  (PROTONIX ) 40 MG tablet Take 1 tablet (40 mg total) by mouth 2 (two) times daily.   vitamin C (ASCORBIC ACID) 250 MG tablet Take 250 mg by mouth daily. 3 times a week   [DISCONTINUED] Semaglutide -Weight Management (WEGOVY ) 0.5 MG/0.5ML SOAJ Inject 0.5 mg into the skin once a week.   No facility-administered medications prior to visit.

## 2024-06-24 ENCOUNTER — Other Ambulatory Visit: Payer: Self-pay | Admitting: Family Medicine

## 2024-07-02 ENCOUNTER — Telehealth: Payer: Self-pay | Admitting: *Deleted

## 2024-07-02 NOTE — Telephone Encounter (Unsigned)
 Copied from CRM #[id number]. Topic: Clinical - Medication Refill >> Jul 02, 2024  8:17 AM Frederich PARAS wrote: Medication: buPROPion  (WELLBUTRIN  XL) 300 MG 24 hr tablet  Has the patient contacted their pharmacy? No, pt needs a bridge prescription   This is the patient's preferred pharmacy:  [id number] - [place], [place] - [address] AT [facility] OF [address] [place] [address] Phone: [phone] Fax: [phone]  Is this the correct pharmacy for this prescription? Yes If no, delete pharmacy and type the correct one.   Has the prescription been filled recently? Yes  Is the patient out of the medication? Yes , as of 07/02/24  Has the patient been seen for an appointment in the last year OR does the patient have an upcoming appointment? Yes  Can we respond through MyChart? Yes  Pt has  a TOC apt on 07/26/24 pt would like to know if she can get a bridge prescription to get her to her apt day and time.   Agent: Please be advised that Rx refills may take up to 3 business days. We ask that you follow-up with your pharmacy.

## 2024-07-02 NOTE — Telephone Encounter (Signed)
 Copied from CRM [id number]. Topic: Appointments - Transfer of Care >> Jul 02, 2024  8:13 AM Frederich PARAS wrote: Pt is requesting to transfer FROM:[facility] [employer] is requesting to transfer TO: [facility] Reason for requested transfer: dr jason left the practice  It is the responsibility of the team the patient would like to transfer to (Dr. Honora Seip ) to reach out to the patient if for any reason this transfer is not acceptable.

## 2024-07-03 MED ORDER — BUPROPION HCL ER (XL) 300 MG PO TB24: 300.0000 mg | ORAL_TABLET | Freq: Every day | ORAL | 2 refills | Status: AC

## 2024-07-24 ENCOUNTER — Other Ambulatory Visit: Payer: Self-pay | Admitting: Family

## 2024-07-26 ENCOUNTER — Encounter: Admitting: Physician Assistant
